# Patient Record
Sex: Male | Born: 1980 | Race: White | Hispanic: No | Marital: Married | Smoking: Former smoker
Health system: Southern US, Community
[De-identification: ages and names within clinical notes are randomized; demographics above are authoritative.]

## PROBLEM LIST (undated history)

## (undated) DIAGNOSIS — F419 Anxiety disorder, unspecified: Secondary | ICD-10-CM

## (undated) DIAGNOSIS — G4730 Sleep apnea, unspecified: Secondary | ICD-10-CM

## (undated) DIAGNOSIS — L4050 Arthropathic psoriasis, unspecified: Secondary | ICD-10-CM

## (undated) DIAGNOSIS — L409 Psoriasis, unspecified: Secondary | ICD-10-CM

## (undated) DIAGNOSIS — M1990 Unspecified osteoarthritis, unspecified site: Secondary | ICD-10-CM

## (undated) DIAGNOSIS — M109 Gout, unspecified: Secondary | ICD-10-CM

## (undated) DIAGNOSIS — I10 Essential (primary) hypertension: Secondary | ICD-10-CM

## (undated) HISTORY — DX: Psoriasis, unspecified: L40.9

## (undated) HISTORY — DX: Essential (primary) hypertension: I10

## (undated) HISTORY — DX: Gout, unspecified: M10.9

## (undated) HISTORY — DX: Sleep apnea, unspecified: G47.30

## (undated) HISTORY — DX: Unspecified osteoarthritis, unspecified site: M19.90

## (undated) HISTORY — PX: VASECTOMY: SHX75

## (undated) HISTORY — DX: Anxiety disorder, unspecified: F41.9

## (undated) HISTORY — DX: Arthropathic psoriasis, unspecified: L40.50

---

## 2012-04-14 HISTORY — PX: ANTERIOR CRUCIATE LIGAMENT REPAIR: SHX115

## 2015-10-10 ENCOUNTER — Other Ambulatory Visit (HOSPITAL_COMMUNITY): Payer: Self-pay | Admitting: Rheumatology

## 2015-10-10 ENCOUNTER — Ambulatory Visit (HOSPITAL_COMMUNITY)
Admission: RE | Admit: 2015-10-10 | Discharge: 2015-10-10 | Disposition: A | Discharge status: Home / Self Care | Payer: BLUE CROSS/BLUE SHIELD | Source: Ambulatory Visit | Attending: Rheumatology | Admitting: Rheumatology

## 2015-10-10 DIAGNOSIS — Z79899 Other long term (current) drug therapy: Secondary | ICD-10-CM | POA: Diagnosis present

## 2016-01-23 ENCOUNTER — Ambulatory Visit (INDEPENDENT_AMBULATORY_CARE_PROVIDER_SITE_OTHER): Payer: BLUE CROSS/BLUE SHIELD | Admitting: Rheumatology

## 2016-01-23 DIAGNOSIS — M533 Sacrococcygeal disorders, not elsewhere classified: Secondary | ICD-10-CM

## 2016-01-23 DIAGNOSIS — M79671 Pain in right foot: Secondary | ICD-10-CM

## 2016-01-23 DIAGNOSIS — L408 Other psoriasis: Secondary | ICD-10-CM

## 2016-01-23 DIAGNOSIS — M79641 Pain in right hand: Secondary | ICD-10-CM

## 2016-01-23 DIAGNOSIS — Z79899 Other long term (current) drug therapy: Secondary | ICD-10-CM

## 2016-01-23 DIAGNOSIS — M542 Cervicalgia: Secondary | ICD-10-CM

## 2016-02-05 ENCOUNTER — Telehealth: Payer: Self-pay | Admitting: Radiology

## 2016-02-05 NOTE — Telephone Encounter (Signed)
I have called patient to advise labs are c/w previous ALT 61/ they are available to view in [facility]

## 2016-02-18 ENCOUNTER — Other Ambulatory Visit: Payer: Self-pay | Admitting: Rheumatology

## 2016-02-18 DIAGNOSIS — Z79899 Other long term (current) drug therapy: Secondary | ICD-10-CM

## 2016-02-18 LAB — CBC WITH DIFFERENTIAL/PLATELET
BASOS PCT: 0 %
Basophils Absolute: 0 cells/uL (ref 0–200)
Eosinophils Absolute: 162 cells/uL (ref 15–500)
Eosinophils Relative: 2 %
HEMATOCRIT: 45.3 % (ref 38.5–50.0)
HEMOGLOBIN: 15.6 g/dL (ref 13.2–17.1)
LYMPHS ABS: 2835 {cells}/uL (ref 850–3900)
LYMPHS PCT: 35 %
MCH: 31.5 pg (ref 27.0–33.0)
MCHC: 34.4 g/dL (ref 32.0–36.0)
MCV: 91.3 fL (ref 80.0–100.0)
MONO ABS: 729 {cells}/uL (ref 200–950)
MPV: 9.4 fL (ref 7.5–12.5)
Monocytes Relative: 9 %
Neutro Abs: 4374 cells/uL (ref 1500–7800)
Neutrophils Relative %: 54 %
Platelets: 254 10*3/uL (ref 140–400)
RBC: 4.96 MIL/uL (ref 4.20–5.80)
RDW: 13.9 % (ref 11.0–15.0)
WBC: 8.1 10*3/uL (ref 3.8–10.8)

## 2016-02-19 LAB — COMPLETE METABOLIC PANEL WITH GFR
ALT: 66 U/L — AB (ref 9–46)
AST: 34 U/L (ref 10–40)
Albumin: 4.9 g/dL (ref 3.6–5.1)
Alkaline Phosphatase: 69 U/L (ref 40–115)
BUN: 10 mg/dL (ref 7–25)
CALCIUM: 9.6 mg/dL (ref 8.6–10.3)
CHLORIDE: 105 mmol/L (ref 98–110)
CO2: 24 mmol/L (ref 20–31)
CREATININE: 1.07 mg/dL (ref 0.60–1.35)
GFR, Est African American: >89 mL/min (ref >=60)
GFR, Est Non African American: >89 mL/min (ref >=60)
GLUCOSE: 98 mg/dL (ref 65–99)
Potassium: 5 mmol/L (ref 3.5–5.3)
Sodium: 138 mmol/L (ref 135–146)
Total Bilirubin: 0.9 mg/dL (ref 0.2–1.2)
Total Protein: 7.2 g/dL (ref 6.1–8.1)

## 2016-02-20 ENCOUNTER — Telehealth: Payer: Self-pay | Admitting: Radiology

## 2016-02-20 NOTE — Telephone Encounter (Signed)
C/W previous will call pt

## 2016-02-20 NOTE — Telephone Encounter (Signed)
Advised patient

## 2016-02-26 ENCOUNTER — Telehealth: Payer: Self-pay | Admitting: Rheumatology

## 2016-02-26 NOTE — Telephone Encounter (Signed)
We have already provided him several months of samples, I spoke to patient / he states you told him we could give him samples, since you have him on weekly Humira until he returns to clinic in Dec.  He has visit 03/24/16 for follow up,do you want him to use weekly until then ? Notes states weekly x1 month, but this will be 2 months. Please advise.

## 2016-02-26 NOTE — Telephone Encounter (Signed)
Ok to give Humira samples. Total of 2 months on weekly Humira.

## 2016-02-26 NOTE — Telephone Encounter (Signed)
Patient would like to know about getting samples of Humira to supplement the dosage he receives from  [facility]. Please call patient.

## 2016-02-27 NOTE — Telephone Encounter (Signed)
Called him to advise he can pick up samples

## 2016-03-14 ENCOUNTER — Other Ambulatory Visit: Payer: Self-pay | Admitting: Rheumatology

## 2016-03-14 NOTE — Telephone Encounter (Signed)
Last Visit: 01/23/16 Next Visit: 03/24/16 Labs: 01/24/16 WNL  Okay to refill Prednisone?

## 2016-03-17 ENCOUNTER — Telehealth: Payer: Self-pay | Admitting: Pharmacist

## 2016-03-17 NOTE — Telephone Encounter (Signed)
Patient states that after he takes the MTX he feels bad.He states that some days are good but he is also having bad days with his joints. He states he is having more bad days then good. He is having swelling in the feet and ankles. Aching in the knees as well as pain in the neck, shoulders and elbows. Hands are also swelling. He has a follow up appointment 03/24/16.

## 2016-03-17 NOTE — Telephone Encounter (Signed)
Please call patient to find out if he still needs prednisone. He was on taper and he finished the taper. He is on Humira and methotrexate. If the medications are working for him he should be able to stop his prednisone

## 2016-03-17 NOTE — Telephone Encounter (Signed)
Called patient.  Advised him that a prednisone taper was sent in for him.  Reviewed instructions of taper.    Also discussed that Dr. Corliss Skainseveshwar wants to apply for Enbrel.  Counseled patient that this will replace his Humira.  Counseled patient on the purpose, proper use, and adverse effects of Enbrel.  Will mail patient consent on Enbrel.  Patient is scheduled for appointment on 03/24/16.  Patient's most recent Humira dose was on 03/13/16.  Advised patient that he will need to wait until 2 weeks after his most recent Humira dose before starting Enbrel.  Applied for Enbrel through [employer] insurance.  Will update patient once we know status of the prior approval.     Lilla Shookachel Sondos Wolfman, Pharm.D., BCPS Clinical Pharmacist Pager: [phone] Phone: [phone] 03/17/2016 3:10 PM

## 2016-03-17 NOTE — Telephone Encounter (Signed)
Ok to give Prednisone taper as rxd earlier. He will need change in TT. Will apply for Enbrel and schedule an appointment.

## 2016-03-17 NOTE — Telephone Encounter (Addendum)
Received approval from patient's insurance for Enbrel (Case ID: [id number], Coverage Start Date: 03/17/16, Coverage End Date: 03/17/17).    Patient's most recent Humira injection was on 03/13/16.  He currently has appointment scheduled for 03/24/16 but will not be able to start Enbrel until two weeks after the Humira on 03/27/16.  Do you want patient to try to reschedule his appointment for 03/27/16?

## 2016-03-18 NOTE — Telephone Encounter (Signed)
Yes

## 2016-03-18 NOTE — Telephone Encounter (Signed)
Received return phone call from patient.  Advised that Enbrel was approved by his insurance company.  Discussed rescheduling appointment for 03/27/16 in order to give first Enbrel injection in the office.  Will need to get consent from patient prior to initiation injection or before sending in prescription for the medication.  Enbrel consent was mailed to patient on 03/17/16.  Will plan to obtain consent from patient during visit prior to initial injection and give initial injection using sample medication.    Lilla Shookachel Henderson, Pharm.D., BCPS Clinical Pharmacist Pager: [phone] Phone: [phone] 03/18/2016 1:38 PM

## 2016-03-18 NOTE — Telephone Encounter (Signed)
Called patient to advised that Enbrel was approved by his insurance and also discuss changing his appointment to 03/27/16.  There was no answer.  I left patient a message asking him to return my call.

## 2016-03-21 NOTE — Progress Notes (Signed)
Received a fax from [employer] [facility] regarding a prior authorization approval for Enbrel Pen from 03/17/2016 to 03/17/2017  Will scan document into epic.  Koichi Platte, [place], CPhT   11:00 AM a

## 2016-03-24 ENCOUNTER — Ambulatory Visit: Payer: BLUE CROSS/BLUE SHIELD | Admitting: Rheumatology

## 2016-03-25 DIAGNOSIS — L4050 Arthropathic psoriasis, unspecified: Secondary | ICD-10-CM | POA: Insufficient documentation

## 2016-03-25 DIAGNOSIS — Z79899 Other long term (current) drug therapy: Secondary | ICD-10-CM | POA: Insufficient documentation

## 2016-03-25 DIAGNOSIS — M533 Sacrococcygeal disorders, not elsewhere classified: Secondary | ICD-10-CM

## 2016-03-25 DIAGNOSIS — M79643 Pain in unspecified hand: Secondary | ICD-10-CM | POA: Insufficient documentation

## 2016-03-25 DIAGNOSIS — M542 Cervicalgia: Secondary | ICD-10-CM | POA: Insufficient documentation

## 2016-03-25 DIAGNOSIS — G8929 Other chronic pain: Secondary | ICD-10-CM | POA: Insufficient documentation

## 2016-03-25 NOTE — Progress Notes (Signed)
Office Visit Note  Patient: Miguel Evans             Date of Birth: June 20, 1980           MRN: [id number]             PCP: Enid Skeens., MD Referring: Enid Skeens., MD Visit Date: 03/27/2016 Occupation: Unemployed     Subjective:  Pain in hands and feet   History of Present Illness: Miguel Evans is a 35 y.o. male has history of psoriatic arthritis. He continues to have pain and discomfort in his hands and feet. He is been off Humira for 2 weeks now to start on Enbrel which he will be given today. He continues to have a lot of swelling in his hands and feet discomfort in his bilateral knee joints, and discomfort and rotating his head and neck. He denies any rash. He is on long-term disability currently has he is unable to do his work.  Activities of Daily Living:  Patient reports morning stiffness for 2 hours.   Patient Reports nocturnal pain.  Difficulty dressing/grooming: Denies Difficulty climbing stairs: Reports Difficulty getting out of chair: Reports Difficulty using hands for taps, buttons, cutlery, and/or writing: Reports   Review of Systems  Constitutional: Positive for fatigue. Negative for night sweats and weakness ( ).  HENT: Positive for mouth dryness. Negative for mouth sores and nose dryness.   Eyes: Negative for redness and dryness.  Respiratory: Negative for shortness of breath and difficulty breathing.   Cardiovascular: Negative for chest pain, palpitations, hypertension, irregular heartbeat and swelling in legs/feet.  Gastrointestinal: Positive for diarrhea. Negative for constipation.  Endocrine: Negative for increased urination.  Musculoskeletal: Positive for arthralgias, joint pain, joint swelling and morning stiffness. Negative for myalgias, muscle weakness, muscle tenderness and myalgias.  Skin: Negative for color change, rash, hair loss, nodules/bumps, skin tightness, ulcers and sensitivity to sunlight.  Allergic/Immunologic: Negative for susceptible to  infections.  Neurological: Negative for dizziness, fainting, memory loss and night sweats.  Hematological: Negative for swollen glands.  Psychiatric/Behavioral: Positive for sleep disturbance. Negative for depressed mood. The patient is nervous/anxious.     PMFS History:  Patient Active Problem List   Diagnosis Date Noted  . Hyperuricemia 03/26/2016  . Elevated blood pressure  03/26/2016  . Elevated LFTs 03/26/2016  . Anxiety 03/26/2016  . Psoriatic arthritis ([place]) 03/25/2016  . High risk medication use 03/25/2016  . Neck pain 03/25/2016  . Hand pain 03/25/2016  . 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 Anxiety   . Gout   . Hypertension     Family History  Problem Relation Age of Onset  . Cancer Mother   . Bipolar disorder Mother   . Hypertension Father   . Osteoarthritis Father   . Emphysema Maternal Grandfather   . CAD Paternal Grandfather    Past Surgical History:  Procedure Laterality Date  . ANTERIOR CRUCIATE LIGAMENT REPAIR Bilateral Left in 2002, Right 2013   Social History   Social History Narrative  . No narrative on file     Objective: Vital Signs: BP (!) 142/98   Pulse 100   Ht 6' 3"  (1.905 m)   Wt (!) 334 lb (151.5 kg)   BMI 41.75 kg/m    Physical Exam  Constitutional: He is oriented to person, place, and time. He appears well-developed and well-nourished.  HENT:  Head: Normocephalic and atraumatic.  Eyes: Conjunctivae and EOM are  normal. Pupils are equal, round, and reactive to light.  Neck: Normal range of motion. Neck supple.  Cardiovascular: Normal rate, regular rhythm and normal heart sounds.   Pulmonary/Chest: Effort normal and breath sounds normal.  Abdominal: Soft. Bowel sounds are normal.  Neurological: He is alert and oriented to person, place, and time.  Skin: Skin is warm and dry. Capillary refill takes less than 2 seconds.  Psychiatric: He has a normal mood and affect. His behavior is  normal.  Nursing note and vitals reviewed.    Musculoskeletal Exam: C-spine limited range of motion. Tenderness over left SI joint. Shoulder joints abduction limited to 90 bilaterally. Tenderness on palpation over bilateral elbow joints bilateral wrist joints bilateral PIPs and DIPs as described. Dactylitis and right thumb third finger, left second third and fourth finger. Painful range of motion of bilateral hip joints knee joints, ankle joints the right ankle joint is warm to touch he had dactylitis and his right second and third and fifth toe and left second and fourth toe  CDAI Exam: CDAI Homunculus Exam:   Tenderness:  RUE: glenohumeral and ulnohumeral and radiohumeral LUE: glenohumeral and ulnohumeral and radiohumeral Right hand: 1st PIP, 2nd PIP, 3rd PIP, 2nd DIP and 3rd DIP Left hand: 2nd PIP, 3rd PIP, 4th PIP, 2nd DIP, 3rd DIP and 4th DIP RLE: tibiofemoral and tibiotalar LLE: tibiofemoral Right foot: 2nd MTP, 3rd MTP, 5th MTP, 2nd PIP, 3rd PIP and 5th PIP Left foot: 2nd MTP, 4th MTP, 2nd PIP and 4th PIP  Swelling:  Right hand: 1st PIP, 2nd PIP, 3rd PIP, 2nd DIP and 3rd DIP Left hand: 2nd PIP, 3rd PIP, 4th PIP, 2nd DIP, 3rd DIP and 4th DIP RLE: tibiotalar Right foot: 2nd MTP, 3rd MTP, 5th MTP, 2nd PIP, 3rd PIP and 5th PIP Left foot: 2nd MTP, 4th MTP, 2nd PIP and 4th PIP  Joint Counts:  CDAI Tender Joint count: 12 CDAI Swollen Joint count: 6  Global Assessments:  Patient Global Assessment: 8 Provider Global Assessment: 8  CDAI Calculated Score: 34    Investigation: Findings:  June 2017:  CBC, comprehensive metabolic panel, sed rate, CK, TSH, UA were normal.  Uric acid was 8.9 which is still elevated.  Rheumatoid factor, CCP, and 14-3-3 eta were normal.  ANA was 1:280 nucleolar pattern.  Hepatitis panel, HIV, immunoglobulins, SPEP, and TB Gold were all within normal limits  Chest Xray 2 view 10/10/15 no active disease   Lab on 02/18/2016  Component Date Value Ref  Range Status  . WBC 02/18/2016 8.1  3.8 - 10.8 K/uL Final  . RBC 02/18/2016 4.96  4.20 - 5.80 MIL/uL Final  . Hemoglobin 02/18/2016 15.6  13.2 - 17.1 g/dL Final  . HCT 02/18/2016 45.3  38.5 - 50.0 % Final  . MCV 02/18/2016 91.3  80.0 - 100.0 fL Final  . MCH 02/18/2016 31.5  27.0 - 33.0 pg Final  . MCHC 02/18/2016 34.4  32.0 - 36.0 g/dL Final  . RDW 02/18/2016 13.9  11.0 - 15.0 % Final  . Platelets 02/18/2016 254  140 - 400 K/uL Final  . MPV 02/18/2016 9.4  7.5 - 12.5 fL Final  . Neutro Abs 02/18/2016 4374  1,500 - 7,800 cells/uL Final  . Lymphs Abs 02/18/2016 2835  850 - 3,900 cells/uL Final  . Monocytes Absolute 02/18/2016 729  200 - 950 cells/uL Final  . Eosinophils Absolute 02/18/2016 162  15 - 500 cells/uL Final  . Basophils Absolute 02/18/2016 0  0 - 200 cells/uL Final  .  Neutrophils Relative % 02/18/2016 54  % Final  . Lymphocytes Relative 02/18/2016 35  % Final  . Monocytes Relative 02/18/2016 9  % Final  . Eosinophils Relative 02/18/2016 2  % Final  . Basophils Relative 02/18/2016 0  % Final  . Smear Review 02/18/2016 Criteria for review not met   Final  . Sodium 02/19/2016 138  135 - 146 mmol/L Final  . Potassium 02/19/2016 5.0  3.5 - 5.3 mmol/L Final  . Chloride 02/19/2016 105  98 - 110 mmol/L Final  . CO2 02/19/2016 24  20 - 31 mmol/L Final  . Glucose, Bld 02/19/2016 98  65 - 99 mg/dL Final  . BUN 02/19/2016 10  7 - 25 mg/dL Final  . Creat 02/19/2016 1.07  0.60 - 1.35 mg/dL Final  . Total Bilirubin 02/19/2016 0.9  0.2 - 1.2 mg/dL Final  . Alkaline Phosphatase 02/19/2016 69  40 - 115 U/L Final  . AST 02/19/2016 34  10 - 40 U/L Final  . ALT 02/19/2016 66* 9 - 46 U/L Final  . Total Protein 02/19/2016 7.2  6.1 - 8.1 g/dL Final  . Albumin 02/19/2016 4.9  3.6 - 5.1 g/dL Final  . Calcium 02/19/2016 9.6  8.6 - 10.3 mg/dL Final  . GFR, Est African American 02/19/2016 >89  >=60 mL/min Final  . GFR, Est Non African American 02/19/2016 >89  >=60 mL/min Final      Imaging: No  results found.  Speciality Comments: No specialty comments available.    Procedures:  No procedures performed Allergies: Patient has no allergy information on record.   Assessment / Plan:     Visit Diagnoses: Psoriatic arthritis ([place]) - Dactylitis, Achillis tendinitis, sacroiliitis. He still has very active disease with the pain and inflammation in multiple joints. He did not respond to methotrexate him 0.8 ML subcutaneous and Humira combination. He came off Humira 2 weeks ago and came in to get his first Enbrel injection in the office today which was administered in the office today after indications side effects contraindications were discussed handout was given consent was taken he was observed in the office for 30 minutes without any side effects. He'll be taking Enbrel injections at home now every week. Due to ongoing pain and swelling he was given prednisone 5 mg tablets he will take 10 mg and taper by 5 mg every 2 weeks.   High risk medication use - Humira every other weekwhich has been discontinued. , methotrexate 0.8 ML  subcutaneous. His LFTs are elevated. We will reduce his methotrexate 2.6 ML subcutaneously.   Chronic left SI joint pain: He continues to have some discomfort in left SI joint.   Neck pain: He has decreased range of motion.   dactylitis: He has still on dactylitis and is some hands and feet. He is having difficulty with mobility .  Hyperuricemia: Not symptomatic   Elevated blood pressure : His blood pressure is a still elevated at advised him to monitor his blood pressure closely and follow up with his PCP.   Elevated LFTs - advised not to drink alcohol and we have reduced the methotrexate dose.   Anxiety : Due to his disease process.  He is unable to work at this time I'll give him a work excuse until her next follow-up visit in 3 months. Temporary parking placard was also given.    Orders: Orders Placed This Encounter  Procedures  . CBC with  Differential/Platelet  . COMPLETE METABOLIC PANEL WITH GFR   Meds  ordered this encounter  Medications  . etanercept (ENBREL SURECLICK) 50 MG/ML injection    Sig: Inject 0.98 mLs (50 mg total) into the skin once a week.    Dispense:  11.76 mL    Refill:  0    Face-to-face time spent with patient was 45 minutes. 50% of time was spent in counseling and coordination of care.  Follow-Up Instructions: Return in about 3 months (around 06/25/2016) for Psoriatic arthritis.   Bo Merino, MD

## 2016-03-26 DIAGNOSIS — F419 Anxiety disorder, unspecified: Secondary | ICD-10-CM | POA: Insufficient documentation

## 2016-03-26 DIAGNOSIS — R030 Elevated blood-pressure reading, without diagnosis of hypertension: Secondary | ICD-10-CM | POA: Insufficient documentation

## 2016-03-26 DIAGNOSIS — R7989 Other specified abnormal findings of blood chemistry: Secondary | ICD-10-CM | POA: Insufficient documentation

## 2016-03-26 DIAGNOSIS — R945 Abnormal results of liver function studies: Secondary | ICD-10-CM

## 2016-03-26 DIAGNOSIS — E790 Hyperuricemia without signs of inflammatory arthritis and tophaceous disease: Secondary | ICD-10-CM | POA: Insufficient documentation

## 2016-03-27 ENCOUNTER — Telehealth: Payer: Self-pay | Admitting: Rheumatology

## 2016-03-27 ENCOUNTER — Encounter: Payer: Self-pay | Admitting: Rheumatology

## 2016-03-27 ENCOUNTER — Ambulatory Visit (INDEPENDENT_AMBULATORY_CARE_PROVIDER_SITE_OTHER): Payer: BLUE CROSS/BLUE SHIELD | Admitting: Rheumatology

## 2016-03-27 VITALS — BP 142/98 | HR 100 | Ht 75.0 in | Wt 334.0 lb

## 2016-03-27 DIAGNOSIS — M79641 Pain in right hand: Secondary | ICD-10-CM

## 2016-03-27 DIAGNOSIS — L4050 Arthropathic psoriasis, unspecified: Secondary | ICD-10-CM

## 2016-03-27 DIAGNOSIS — G8929 Other chronic pain: Secondary | ICD-10-CM | POA: Diagnosis not present

## 2016-03-27 DIAGNOSIS — M533 Sacrococcygeal disorders, not elsewhere classified: Secondary | ICD-10-CM | POA: Diagnosis not present

## 2016-03-27 DIAGNOSIS — R7989 Other specified abnormal findings of blood chemistry: Secondary | ICD-10-CM

## 2016-03-27 DIAGNOSIS — E790 Hyperuricemia without signs of inflammatory arthritis and tophaceous disease: Secondary | ICD-10-CM | POA: Diagnosis not present

## 2016-03-27 DIAGNOSIS — R030 Elevated blood-pressure reading, without diagnosis of hypertension: Secondary | ICD-10-CM | POA: Diagnosis not present

## 2016-03-27 DIAGNOSIS — Z79899 Other long term (current) drug therapy: Secondary | ICD-10-CM

## 2016-03-27 DIAGNOSIS — F419 Anxiety disorder, unspecified: Secondary | ICD-10-CM

## 2016-03-27 DIAGNOSIS — R945 Abnormal results of liver function studies: Secondary | ICD-10-CM

## 2016-03-27 DIAGNOSIS — M542 Cervicalgia: Secondary | ICD-10-CM

## 2016-03-27 DIAGNOSIS — M79642 Pain in left hand: Secondary | ICD-10-CM | POA: Diagnosis not present

## 2016-03-27 MED ORDER — ETANERCEPT 50 MG/ML ~~LOC~~ SOAJ
50.0000 mg | SUBCUTANEOUS | 0 refills | Status: DC
Start: 2016-03-27 — End: 2016-04-02

## 2016-03-27 NOTE — Telephone Encounter (Signed)
Patient states he was looking through his paperwork when he got home and noticed he didn't;t have the letter to send to his long term disability company. Patient advised the letter was written and we apologize he didn't receive it before he left. Advised would have Dr. Corliss Skainseveshwar sign it. Patient provided fax number [phone] and claim number [id number] and asked if we could fax it for him. Patient advised letter would be faxed on his behalf.

## 2016-03-27 NOTE — Telephone Encounter (Signed)
Patient was seen this morning and states he was supposed to receive a note for his long term disability and he did not receive it. Can you check on this please and call patient?

## 2016-03-27 NOTE — Progress Notes (Signed)
Pharmacy Note Subjective: Patient presents today to the [facility] to see Dr. Corliss Skainseveshwar.  Patient is being switched from Humira to Enbrel.  He confirms his last dose of Humira was two weeks ago on 03/13/16.   Patient seen by the pharmacist for counseling on Enbrel.    Objective: TB Test: negative (10/10/15) Hepatitis panel: negative (10/10/15) WUJ:WJXBJYNWHIV:negative (10/10/15)  CBC    Component Value Date/Time   WBC 8.1 02/18/2016 1320   RBC 4.96 02/18/2016 1320   HGB 15.6 02/18/2016 1320   HCT 45.3 02/18/2016 1320   PLT 254 02/18/2016 1320   MCV 91.3 02/18/2016 1320   MCH 31.5 02/18/2016 1320   MCHC 34.4 02/18/2016 1320   RDW 13.9 02/18/2016 1320   LYMPHSABS 2,835 02/18/2016 1320   MONOABS 729 02/18/2016 1320   EOSABS 162 02/18/2016 1320   BASOSABS 0 02/18/2016 1320    Assessment/Plan:  Counseled patient that Enbrel is a TNF blocking agent.  Reviewed Enbrel dose of 50 mg once weekly.  Counseled patient on purpose, proper use, and adverse effects of Enbrel.  Reviewed the most common adverse effects including infections, headache, and injection site reactions. Discussed that there is the possibility of an increased risk of malignancy but it is not well understood if this increased risk is due to the medication or the disease state.  Advised patient to get yearly dermatology exams due to risk of skin cancer.  Reviewed the importance of regular labs while on Enbrel therapy.  Advised patient to get standing labs one month after starting Enbrel then every 2 months.  Provided patient with standing lab orders.  Counseled patient that Enbrel should be held prior to scheduled surgery.  Counseled patient to avoid live vaccines while on Enbrel.  Patient confirms he has already had his annual influenza vaccine and the pneumococcal vaccine.  Provided patient with medication education material and answered all questions.  Patient voiced understanding and consented to Enbrel.  Will upload consent  into the media tab.    Patient was educated on how to administer Enbrel using a SureClick demonstration pen.  Patient self-administered his initial injection in clinic today.  Lot # [id number], Exp 12/19.  Patient was monitored for 30 minutes post injection.  No injection site reaction was noted.  Provided patient with an Enbrel coupon card to assist with copay cost.    Lilla Shookachel Henderson, Pharm.D., BCPS Clinical Pharmacist Pager: [phone] Phone: [phone] 03/27/2016 8:25 AM

## 2016-03-27 NOTE — Patient Instructions (Addendum)
Prednisone Taper:  Take prednisone 10 mg (2 of the 5 mg tablets) daily for two weeks, then decrease dose to 5 mg (1 of the 5 mg tablets) daily for two weeks, then decrease dose to 2.5 mg (1/2 of a 5 mg tablet) daily for two weeks.   Reduce your methotrexate dose to 0.6 mL weekly.  Standing Labs We placed an order today for your standing lab work.    Please come back and get your standing labs in 1 month then every 2 months  We have open lab Monday through Friday from 8:30-11:30 AM and 1:30-4 PM at the office of Dr. Arbutus PedShaili Deveshwar/Naitik Panwala, PA.   The office is located at [address], [address], [place], [place] [address] No appointment is necessary.   Labs are drawn by [facility].  You may receive a bill from [facility] for your lab work.

## 2016-04-01 ENCOUNTER — Telehealth: Payer: Self-pay | Admitting: Rheumatology

## 2016-04-01 NOTE — Telephone Encounter (Signed)
Patient called and says the pharmacy told him colchicine was denied by his insurance company and [employer] insurance company is requesting a call from the office. Please call [employer].

## 2016-04-02 ENCOUNTER — Telehealth: Payer: Self-pay | Admitting: *Deleted

## 2016-04-02 MED ORDER — ETANERCEPT 50 MG/ML ~~LOC~~ SOAJ: 50.0000 mg | SUBCUTANEOUS | 0 refills | Status: DC

## 2016-04-02 NOTE — Telephone Encounter (Signed)
Patient advised okay to pick up samples of Enbrel. Patient to come in the morning and pick them up.

## 2016-04-02 NOTE — Telephone Encounter (Signed)
Patient states he is having trouble getting his Enbrel prescription filled. Patient states he has contacted the pharmacy and the insurance company and that his insurance uses [facility] for his speciality medications. Patient advised we would send prescription to [facility]. Patient states he will need samples until they mail the medication to him. Okay to given sample of Enbrel?

## 2016-04-02 NOTE — Telephone Encounter (Signed)
Patient called again about medications. Please call.

## 2016-04-02 NOTE — Telephone Encounter (Signed)
Brand name Colcrys is now preferred by patient's insurance company.  I called [facility] regarding colchicine.  They ran the prescription as Colcrys successfully.  I called patient to update him.  I also advised him that I can give him a Colcrys coupon card that will assist with the copay.  Patient will pick up coupon card tomorrow.     Lilla Shookachel Maricus Tanzi, Pharm.D., BCPS Clinical Pharmacist Pager: [phone] Phone: [phone] 04/02/2016 4:39 PM

## 2016-04-02 NOTE — Telephone Encounter (Signed)
Okay 

## 2016-04-03 NOTE — Telephone Encounter (Signed)
Medication Samples have been provided to the patient.  Drug name: Enbrel      Strength: 50 mg        Qty: 2  JYN:[id number]  Exp.Date: 03/2018  Dosing instructions: Inject one pen SQ weekly  The patient has been instructed regarding the correct time, dose, and frequency of taking this medication, including desired effects and most common side effects.   Miguel Evans 9:52 AM 04/03/2016

## 2016-04-09 ENCOUNTER — Telehealth: Payer: Self-pay

## 2016-04-09 NOTE — Telephone Encounter (Signed)
[facility] Mr.Cains Enbrel. The Technician said he called pts insurance and they told him that his Enbrel must be filled with [facility]. He was going to have the pharmacists transfer the RX to them so the patient could get it filled.  Ladaija Dimino, [place], CPhT

## 2016-04-09 NOTE — Telephone Encounter (Signed)
[facility] to verify the authorize dates for Mr. Miguel Evans Enbrel. Told them that it was auth. From 03/17/16-03/17/17. The medication was still being rejected by insurance. Technician was going to call insurance to see why it was rejecting.   Niel Peretti, [place], CPhT

## 2016-04-29 ENCOUNTER — Other Ambulatory Visit: Payer: Self-pay | Admitting: Rheumatology

## 2016-04-29 NOTE — Telephone Encounter (Signed)
Last Visit: 03/27/16 Next Visit: 06/27/16 Labs: 02/18/16 C/W previous labs  Okay to refill MTX?

## 2016-04-29 NOTE — Telephone Encounter (Signed)
Okay 

## 2016-05-21 ENCOUNTER — Other Ambulatory Visit: Payer: Self-pay | Admitting: Rheumatology

## 2016-05-21 NOTE — Telephone Encounter (Signed)
Last Visit: 03/27/16 Next Visit: 06/27/16 Labs: 02/18/16 C/W previous labs  Okay to refill Allopurinol?

## 2016-06-12 ENCOUNTER — Other Ambulatory Visit: Payer: Self-pay | Admitting: *Deleted

## 2016-06-12 MED ORDER — COLCHICINE 0.6 MG PO TABS: 0.6000 mg | ORAL_TABLET | Freq: Two times a day (BID) | ORAL | 3 refills | Status: DC

## 2016-06-12 NOTE — Telephone Encounter (Signed)
ok 

## 2016-06-12 NOTE — Telephone Encounter (Signed)
Refill Request received via fax  Last Visit: 03/27/16 Next Visit: 06/27/16 Labs: 02/18/16 C/W previous labs  Okay to refill Colcrys?

## 2016-06-25 ENCOUNTER — Other Ambulatory Visit: Payer: Self-pay | Admitting: Rheumatology

## 2016-06-25 NOTE — Telephone Encounter (Addendum)
Last Visit: 03/27/16 Next Visit: 06/27/16 Labs: 06/27/16 Elevated LFTS AST 76 previous normal ALT 118 previous 66 TB Gold: 09/2015 Neg   Okay to refill Enbrel?

## 2016-06-26 NOTE — Progress Notes (Signed)
Office Visit Note  Patient: Miguel Evans             Date of Birth: 1980-11-09           MRN: [id number]             PCP: Nonnie Done., MD Referring: Nonnie Done., MD Visit Date: 06/27/2016 Occupation: @GUAROCC @    Subjective:  Joint Pain and Follow-up   History of Present Illness: Miguel Evans is a 36 y.o. male   Last seen December 2017. See epic for full details. On that visit in December, he was started on Enbrel. He has been taking Enbrel every week since then except he had to discontinue Enbrel temporarily due to surgery for vasectomy. Then he restarted the medication a week later.  He also takes methotrexate 0.6 ML's every week. At one time he was on 0.8 ML's per week but it affected his liver function and so we decreased it to 0.6 and patient is doing well.  He is noncompliant with his labs and we've encouraged him to get his labs updated every 3 months starting today. Patient understands and is agreeable.  Currently he is having a fair amount of pain to various joints including neck, back, hips, knees, feet, hands and wrists. It is very important to note that prior to coming to our office, he was diagnosed with gout and treated for gout only. He remained untreated for psoriatic arthritis during this time. I suspect that he had fair amount of damage done to his joints while only being treated for gout.  Patient currently is on disability and is concerned what his long-term prognosis will be as it relates to psoriatic arthritis and psoriasis and his joints.  Patient states that he did well with exercise when he was feeling good but was unable to exercise with his joints began to be painful.  Activities of Daily Living:  Patient reports morning stiffness for 45 minutes.   Patient Reports nocturnal pain.  Difficulty dressing/grooming: Reports Difficulty climbing stairs: Reports Difficulty getting out of chair: Reports Difficulty using hands for taps, buttons, cutlery,  and/or writing: Reports   No Rheumatology ROS completed.   PMFS History:  Patient Active Problem List   Diagnosis Date Noted  . Hyperuricemia 03/26/2016  . Elevated blood pressure  03/26/2016  . Elevated LFTs 03/26/2016  . Anxiety 03/26/2016  . Psoriatic arthritis (HCC) 03/25/2016  . High risk medication use 03/25/2016  . Neck pain 03/25/2016  . Hand pain 03/25/2016  . 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 Anxiety   . Gout   . Hypertension     Family History  Problem Relation Age of Onset  . Cancer Mother   . Bipolar disorder Mother   . Hypertension Father   . Osteoarthritis Father   . Emphysema Maternal Grandfather   . CAD Paternal Grandfather    Past Surgical History:  Procedure Laterality Date  . ANTERIOR CRUCIATE LIGAMENT REPAIR Bilateral Left in 2002, Right 2013  . VASECTOMY     Social History   Social History Narrative  . No narrative on file     Objective: Vital Signs: BP (!) 140/98   Pulse 88   Resp 16   Ht 6\' 3"  (1.905 m)   Wt (!) 326 lb (147.9 kg)   BMI 40.75 kg/m    Physical Exam   Musculoskeletal Exam:  Decreased range of motion of head and neck secondary  to pain, bilateral shoulder joint, bilateral wrists, bilateral hands, bilateral hips. Grip strength is decreased bilaterally Fibromyalgia tender points are absent  CDAI Exam: No CDAI exam completed.  There is no synovitis on today's examination.  Investigation: Findings:  June 2017:  CBC, comprehensive metabolic panel, sed rate, CK, TSH, UA were normal.  Uric acid was 8.9 which is still elevated.  Rheumatoid factor, CCP, and 14-3-3 eta were normal.  ANA was 1:280 nucleolar pattern.  Hepatitis panel, HIV, immunoglobulins, SPEP, and TB Gold were all within normal limits  Chest Xray 2 view 10/10/15 no active disease  Imaging: No results found.  Speciality Comments: No specialty comments available.    Procedures:  No procedures  performed Allergies: Patient has no known allergies.   Assessment / Plan:     Visit Diagnoses: Psoriatic arthritis (HCC) - With Dactylitis  High risk medication use - Dec 2017: Enbrel q wkMTX 0.6 ML sub; MTX- 0.28mL subQ caused elevated LFT.[ **STOPPED Humira Dec 2017** ]  - Plan: CBC with Differential/Platelet, COMPLETE METABOLIC PANEL WITH GFR, Quantiferon tb gold assay (blood)  Bilateral chronic knee pain  Bilateral hip pain  Pain in both feet  Chronic left SI joint pain  Neck pain  Hyperuricemia  Elevated blood pressure  - 06/27/2016: Avoid NSAID's, steroid injections due to elevated blood pressure.  Elevated LFTs - while on MTX at 0.588ml  Anxiety   Plan:  #1: Psoriatic arthritis and rheumatoid arthritis. Patient is doing relatively better now than it was before. He does have ongoing pain. Namely the pain is to bilateral knees bilateral feet back. I suspect that some of that pain is coming from osteoarthritis. He also complains of neck pain.  Note that at at least for 5 years he was not treated for psoriatic arthritis and was instead treated for only gout. I wonder if he had damage done to his joints while he was treated for gout only and not giving any treatment for psoriatic arthritis.  Patient is responding better to the Enbrel overall then the Humira. At one time he was having about 2 hours of morning stiffness. On a good day now he is about 45 minutes of morning stiffness. This is only after about 2-1/2 months of being on Enbrel. Note dictated to discontinue Enbrel for about a week or 2 when he was getting his surgery for facetectomy.  Patient was also on  prednisone 20 mg for 4 days, 15 for 4, 10 for 4 days, 5 mg for 4 days, then stop. During this time I suspect that he had a fair amount of improvement and patient agreed with me on that. Unfortunately prednisone also not only upset his stomach but altered his mood and he would like to avoid using the medication  when possible. In addition his blood pressure is high today as well as in the past and the prednisone can indeed affect his blood pressure so we want to minimize use of prednisone for this patient. Currently he is addressing his pain was naproxen sodium. This too can affect his blood pressure and we've encouraged him to minimize use of the NSAID when possible including naproxen sodium. Patient is agreeable.  #2: I believe patient's osteoarthritis of the knees is causing pain to his feet and ankles as well as his hip and back. He may benefit from Visco supplementation to his knees. I fear giving him cortisone injection in his knees secondary to his elevated blood pressure. Patient is agreeable and wants us to apply for  Visco. I will ask for Hyalgan for this patient in hopes that it will give him fair amount of relief.  #3: Patient is noncompliant with labs we had a long and thorough discussion on getting the proper labs done at the proper time. He is agreeable to get them done on a regular basis. He did CBC with differential CMP with GFR today. He will get them done every 3 months.  Continue Enbrel every week and methotrexate 0.6 mL's every week and folic acid 2 mg daily. Note that his liver functions went up in October 2017/November 2017 when he was on the higher dose of methotrexate at 0.8 ML's per week. Patient is aware to remind [place] should we want to go up on the methotrexate that he had this issue so that we can find alternative treatment for him.  #4: He also complains that when he was having extended standing at times he would have fluid in his legs that went up above his knees. He states that when he lies down horizontal and elevates his legs, the fluid goes away. This is very consistent with having peripheral edema. I've asked the patient to discuss this with the PCP so that they may want to put him on some fluid pill if appropriate. I'll leave this to the discretion of his  PCP. Patient has made minimal May to wear to follow with his family doctor regarding this as well as his high blood pressure.  #5: Patient's examination today did not show any evidence of synovitis. He does have prominent ulnar styloid on the right wrist and little bit less on the left wrist. There was no evidence of psoriasis on today's examination. Overall I feel like the Enbrel probably is working well for him. I'll will determine in the next visit in 2-3 months whether Enbrel is sufficient for the patient's needs. I am concerned that he has to do with a great amount of pain probably from the on treated psoriatic arthritis that the patient suffered through for about 5 years while they were giving him only treatment for gout.  #6: Patient is on disability at this time. He is not working at all at this time. We will forward appropriate documentation from his previous office visits to whoever the patient designates. He is aware that we do not determine whether or not he is disabled. But his lawyer with the help of proper doctors will do a functional capacity evaluation and determine if his case is strong enough to present to a judge who will then determine whether or not he gets disability. Patient is aware of this.  #7: Return to clinic in 3 months  #8: Okay to renew marking placard let's do one year for now. I have hopes that patient will improve over the next few months and he may not need a parking placard.  #9: I will apply for Visco supplementation for the patient at this time. Hopefully we will hear something in the next 1 or 2 months. If possible I like to get him started in May or June 2018. If his insurance company only approves Euflexxa or Orthovisc, I'll be happy with that but I would prefer the patient to be on Hyalgan if possible.   Orders: No orders of the defined types were placed in this encounter.  No orders of the defined types were placed in this  encounter.   Face-to-face time spent with patient was 40 minutes. 50% of time was spent in counseling and coordination  of care.  Follow-Up Instructions: Return in about 3 months (around 09/27/2016) for PsA, Ps, Enbrel, MTX 0.61ml, Folic 2mg  qd, knee pian, back pain, feet pain (some from OA).   Tawni Pummel, PA-C  Note - This record has been created using [employer].  Chart creation errors have been sought, but may not always  have been located. Such creation errors do not reflect on  the standard of medical care.

## 2016-06-27 ENCOUNTER — Encounter: Payer: Self-pay | Admitting: Rheumatology

## 2016-06-27 ENCOUNTER — Telehealth: Payer: Self-pay | Admitting: Rheumatology

## 2016-06-27 ENCOUNTER — Ambulatory Visit (INDEPENDENT_AMBULATORY_CARE_PROVIDER_SITE_OTHER): Payer: BLUE CROSS/BLUE SHIELD | Admitting: Rheumatology

## 2016-06-27 VITALS — BP 140/98 | HR 88 | Resp 16 | Ht 75.0 in | Wt 326.0 lb

## 2016-06-27 DIAGNOSIS — L4050 Arthropathic psoriasis, unspecified: Secondary | ICD-10-CM

## 2016-06-27 DIAGNOSIS — M25562 Pain in left knee: Secondary | ICD-10-CM

## 2016-06-27 DIAGNOSIS — F419 Anxiety disorder, unspecified: Secondary | ICD-10-CM

## 2016-06-27 DIAGNOSIS — M542 Cervicalgia: Secondary | ICD-10-CM

## 2016-06-27 DIAGNOSIS — M79672 Pain in left foot: Secondary | ICD-10-CM

## 2016-06-27 DIAGNOSIS — G8929 Other chronic pain: Secondary | ICD-10-CM | POA: Diagnosis not present

## 2016-06-27 DIAGNOSIS — E790 Hyperuricemia without signs of inflammatory arthritis and tophaceous disease: Secondary | ICD-10-CM

## 2016-06-27 DIAGNOSIS — M25561 Pain in right knee: Secondary | ICD-10-CM

## 2016-06-27 DIAGNOSIS — M79671 Pain in right foot: Secondary | ICD-10-CM | POA: Diagnosis not present

## 2016-06-27 DIAGNOSIS — M533 Sacrococcygeal disorders, not elsewhere classified: Secondary | ICD-10-CM | POA: Diagnosis not present

## 2016-06-27 DIAGNOSIS — R945 Abnormal results of liver function studies: Secondary | ICD-10-CM

## 2016-06-27 DIAGNOSIS — M25551 Pain in right hip: Secondary | ICD-10-CM | POA: Diagnosis not present

## 2016-06-27 DIAGNOSIS — R030 Elevated blood-pressure reading, without diagnosis of hypertension: Secondary | ICD-10-CM | POA: Diagnosis not present

## 2016-06-27 DIAGNOSIS — R7989 Other specified abnormal findings of blood chemistry: Secondary | ICD-10-CM

## 2016-06-27 DIAGNOSIS — M25552 Pain in left hip: Secondary | ICD-10-CM

## 2016-06-27 DIAGNOSIS — Z79899 Other long term (current) drug therapy: Secondary | ICD-10-CM | POA: Diagnosis not present

## 2016-06-27 LAB — COMPLETE METABOLIC PANEL WITH GFR
ALBUMIN: 4.5 g/dL (ref 3.6–5.1)
ALK PHOS: 70 U/L (ref 40–115)
ALT: 118 U/L — ABNORMAL HIGH (ref 9–46)
AST: 76 U/L — AB (ref 10–40)
BILIRUBIN TOTAL: 0.8 mg/dL (ref 0.2–1.2)
BUN: 13 mg/dL (ref 7–25)
CO2: 24 mmol/L (ref 20–31)
Calcium: 9.4 mg/dL (ref 8.6–10.3)
Chloride: 104 mmol/L (ref 98–110)
Creat: 0.82 mg/dL (ref 0.60–1.35)
GFR, Est African American: >89 mL/min (ref >=60)
GFR, Est Non African American: >89 mL/min (ref >=60)
GLUCOSE: 83 mg/dL (ref 65–99)
Potassium: 4.4 mmol/L (ref 3.5–5.3)
SODIUM: 138 mmol/L (ref 135–146)
TOTAL PROTEIN: 7.1 g/dL (ref 6.1–8.1)

## 2016-06-27 LAB — CBC WITH DIFFERENTIAL/PLATELET
BASOS ABS: 0 {cells}/uL (ref 0–200)
Basophils Relative: 0 %
EOS PCT: 2 %
Eosinophils Absolute: 138 cells/uL (ref 15–500)
HCT: 44.6 % (ref 38.5–50.0)
HEMOGLOBIN: 15.3 g/dL (ref 13.2–17.1)
LYMPHS ABS: 2829 {cells}/uL (ref 850–3900)
Lymphocytes Relative: 41 %
MCH: 31.9 pg (ref 27.0–33.0)
MCHC: 34.3 g/dL (ref 32.0–36.0)
MCV: 92.9 fL (ref 80.0–100.0)
MONO ABS: 690 {cells}/uL (ref 200–950)
MPV: 9.7 fL (ref 7.5–12.5)
Monocytes Relative: 10 %
NEUTROS ABS: 3243 {cells}/uL (ref 1500–7800)
Neutrophils Relative %: 47 %
Platelets: 205 10*3/uL (ref 140–400)
RBC: 4.8 MIL/uL (ref 4.20–5.80)
RDW: 14.1 % (ref 11.0–15.0)
WBC: 6.9 10*3/uL (ref 3.8–10.8)

## 2016-06-27 NOTE — Progress Notes (Signed)
Rheumatology Medication Review by a Pharmacist Does the patient feel that his/her medications are working for him/her?  Patient reports "night and day" difference when starting Enbrel.  He now reports the injection wears off over time.   Has the patient been experiencing any side effects to the medications prescribed?  No Does the patient have any problems obtaining medications?  No  Issues to address at subsequent visits: None   Pharmacist comments:  Miguel Evans is a pleasant 36 yo M who presents for follow up of his psoriatic arthritis.  He reports he is taking Enbrel 50 mg weekly, methotrexate 0.6 mL weekly, and folic acid 2 mg daily.  He confirms he has finished his prednisone taper in mid-January.  He reports he is taking naproxen at least once daily.  I reviewed with patient that naproxen and NSAIDs should be avoided with methotrexate due to increased risk of side effects.  Patient voiced understanding.  Patient has not had labs since starting Enbrel.  He is due for standing labs today.  Patient's TB Gold was negative on 10/10/15.  He will be due for TB Gold again in June 2018.  Patient denies any questions or concerns regarding his medications at this time.    Lilla Shookachel Ercell Razon, Pharm.D., BCPS, CPP Clinical Pharmacist Pager: [phone] Phone: [phone] 06/27/2016 2:16 PM

## 2016-06-27 NOTE — Telephone Encounter (Signed)
Patient is requesting office note from 06/27/16 be sent to his disability company.   Fax# [phone] Claim # [id number]

## 2016-06-29 NOTE — Progress Notes (Signed)
Elevated LFTS

## 2016-07-09 NOTE — Telephone Encounter (Signed)
I spoke with patient. He said he signed a release form but one it did make its way to me. I emailed him form and he will email it back. I will fax his records once I receive form back

## 2016-07-25 ENCOUNTER — Other Ambulatory Visit: Payer: Self-pay | Admitting: *Deleted

## 2016-07-25 DIAGNOSIS — Z79899 Other long term (current) drug therapy: Secondary | ICD-10-CM

## 2016-07-25 LAB — COMPLETE METABOLIC PANEL WITH GFR
ALT: 139 U/L — ABNORMAL HIGH (ref 9–46)
AST: 88 U/L — AB (ref 10–40)
Albumin: 4.8 g/dL (ref 3.6–5.1)
Alkaline Phosphatase: 75 U/L (ref 40–115)
BUN: 11 mg/dL (ref 7–25)
CALCIUM: 10 mg/dL (ref 8.6–10.3)
CHLORIDE: 102 mmol/L (ref 98–110)
CO2: 23 mmol/L (ref 20–31)
Creat: 1.06 mg/dL (ref 0.60–1.35)
GFR, Est Non African American: >89 mL/min (ref >=60)
Glucose, Bld: 84 mg/dL (ref 65–99)
POTASSIUM: 4.5 mmol/L (ref 3.5–5.3)
Sodium: 138 mmol/L (ref 135–146)
Total Bilirubin: 1.4 mg/dL — ABNORMAL HIGH (ref 0.2–1.2)
Total Protein: 7.8 g/dL (ref 6.1–8.1)

## 2016-07-25 LAB — CBC WITH DIFFERENTIAL/PLATELET
Basophils Absolute: 0 cells/uL (ref 0–200)
Basophils Relative: 0 %
Eosinophils Absolute: 249 cells/uL (ref 15–500)
Eosinophils Relative: 3 %
HEMATOCRIT: 47 % (ref 38.5–50.0)
Hemoglobin: 16.2 g/dL (ref 13.2–17.1)
LYMPHS PCT: 44 %
Lymphs Abs: 3652 cells/uL (ref 850–3900)
MCH: 32.1 pg (ref 27.0–33.0)
MCHC: 34.5 g/dL (ref 32.0–36.0)
MCV: 93.1 fL (ref 80.0–100.0)
MONOS PCT: 12 %
MPV: 9.5 fL (ref 7.5–12.5)
Monocytes Absolute: 996 cells/uL — ABNORMAL HIGH (ref 200–950)
NEUTROS PCT: 41 %
Neutro Abs: 3403 cells/uL (ref 1500–7800)
PLATELETS: 250 10*3/uL (ref 140–400)
RBC: 5.05 MIL/uL (ref 4.20–5.80)
RDW: 14.4 % (ref 11.0–15.0)
WBC: 8.3 10*3/uL (ref 3.8–10.8)

## 2016-07-26 NOTE — Progress Notes (Signed)
He should dc MTX , NSAIDS and if he is drinking any alcohol. LFTs are increasing.Please, get repeat LFts in 3weeks.

## 2016-08-15 ENCOUNTER — Other Ambulatory Visit: Payer: Self-pay

## 2016-08-15 DIAGNOSIS — Z79899 Other long term (current) drug therapy: Secondary | ICD-10-CM

## 2016-08-15 LAB — CBC WITH DIFFERENTIAL/PLATELET
BASOS ABS: 0 {cells}/uL (ref 0–200)
Basophils Relative: 0 %
EOS ABS: 168 {cells}/uL (ref 15–500)
EOS PCT: 2 %
HEMATOCRIT: 47.2 % (ref 38.5–50.0)
HEMOGLOBIN: 16 g/dL (ref 13.2–17.1)
LYMPHS ABS: 3276 {cells}/uL (ref 850–3900)
LYMPHS PCT: 39 %
MCH: 31.5 pg (ref 27.0–33.0)
MCHC: 33.9 g/dL (ref 32.0–36.0)
MCV: 92.9 fL (ref 80.0–100.0)
MPV: 9.4 fL (ref 7.5–12.5)
Monocytes Absolute: 840 cells/uL (ref 200–950)
Monocytes Relative: 10 %
NEUTROS ABS: 4116 {cells}/uL (ref 1500–7800)
Neutrophils Relative %: 49 %
Platelets: 237 10*3/uL (ref 140–400)
RBC: 5.08 MIL/uL (ref 4.20–5.80)
RDW: 14.2 % (ref 11.0–15.0)
WBC: 8.4 10*3/uL (ref 3.8–10.8)

## 2016-08-16 LAB — COMPLETE METABOLIC PANEL WITH GFR
ALBUMIN: 4.8 g/dL (ref 3.6–5.1)
ALK PHOS: 71 U/L (ref 40–115)
ALT: 106 U/L — ABNORMAL HIGH (ref 9–46)
AST: 71 U/L — AB (ref 10–40)
BUN: 14 mg/dL (ref 7–25)
CALCIUM: 10 mg/dL (ref 8.6–10.3)
CHLORIDE: 102 mmol/L (ref 98–110)
CO2: 25 mmol/L (ref 20–31)
Creat: 1.05 mg/dL (ref 0.60–1.35)
GFR, Est African American: >89 mL/min (ref >=60)
Glucose, Bld: 77 mg/dL (ref 65–99)
POTASSIUM: 4.6 mmol/L (ref 3.5–5.3)
Sodium: 140 mmol/L (ref 135–146)
Total Bilirubin: 1 mg/dL (ref 0.2–1.2)
Total Protein: 7.4 g/dL (ref 6.1–8.1)

## 2016-08-16 NOTE — Progress Notes (Signed)
Discontinue MTX. Repeat LFTs in 3 weeks

## 2016-08-18 ENCOUNTER — Telehealth: Payer: Self-pay | Admitting: Radiology

## 2016-08-18 LAB — QUANTIFERON TB GOLD ASSAY (BLOOD)
Interferon Gamma Release Assay: NEGATIVE
MITOGEN-NIL SO: 9.63 [IU]/mL
Quantiferon Nil Value: 0.07 IU/mL

## 2016-08-18 NOTE — Telephone Encounter (Signed)
Patient should discontinue Enbrel for 2 weeks and recheck LFTs. If it is still elevated I may consider GI referral

## 2016-08-18 NOTE — Telephone Encounter (Signed)
He states he has been off the MTX since you d/c several weeks ago, he states he has not had any ETOH, he is not on any Cholesterol meds or  Tylenol. He has appt in June, we can repeat the labs then  He is also on Allopurinol Enbrel and Colchicine, do you think any of these may be the cause?

## 2016-08-18 NOTE — Telephone Encounter (Signed)
-----   Message from Pollyann SavoyShaili Deveshwar, MD sent at 08/16/2016  9:12 PM EDT ----- Discontinue MTX. Repeat LFTs in 3 weeks

## 2016-08-19 NOTE — Telephone Encounter (Signed)
Patient advised to discontinue Enbrel for 2 week and then have his LFTs recheck. Patient advised if they are still elevated with next labs. Dr. Corliss Skainseveshwar may consider referral to GI. Patient verbalized understanding.

## 2016-09-03 ENCOUNTER — Telehealth (INDEPENDENT_AMBULATORY_CARE_PROVIDER_SITE_OTHER): Payer: Self-pay

## 2016-09-03 NOTE — Telephone Encounter (Signed)
Faxed Hyalgan application to [phone] 

## 2016-09-03 NOTE — Telephone Encounter (Signed)
-----   Message from [facility] Medical GroupWendy May, RT sent at 08/29/2016  4:11 PM EDT ----- Regarding: apply hyalgan See notes, try Hyalgan first, thanks.  Please fax in.   ----- Message ----- From: Audrie Liaaudle, Sharon H, RT Sent: 08/14/2016   4:43 PM To: Toniann FailWendy May, RT    ----- Message ----- From: Tawni PummelNaitik Panwala, PA-C Sent: 06/27/2016   3:02 PM To: Audrie LiaSharon H Caudle, RT  Apply hyalagan bilateral knees x5.  euflexxa or orthovisc will be ok if preferred by insurance company

## 2016-09-12 ENCOUNTER — Other Ambulatory Visit: Payer: Self-pay

## 2016-09-12 DIAGNOSIS — Z79899 Other long term (current) drug therapy: Secondary | ICD-10-CM

## 2016-09-12 LAB — CBC WITH DIFFERENTIAL/PLATELET
BASOS PCT: 0 %
Basophils Absolute: 0 cells/uL (ref 0–200)
EOS ABS: 158 {cells}/uL (ref 15–500)
Eosinophils Relative: 2 %
HEMATOCRIT: 45.9 % (ref 38.5–50.0)
Hemoglobin: 15.2 g/dL (ref 13.2–17.1)
LYMPHS ABS: 3318 {cells}/uL (ref 850–3900)
Lymphocytes Relative: 42 %
MCH: 30.6 pg (ref 27.0–33.0)
MCHC: 33.1 g/dL (ref 32.0–36.0)
MCV: 92.4 fL (ref 80.0–100.0)
MONO ABS: 553 {cells}/uL (ref 200–950)
MONOS PCT: 7 %
MPV: 8.9 fL (ref 7.5–12.5)
NEUTROS ABS: 3871 {cells}/uL (ref 1500–7800)
Neutrophils Relative %: 49 %
PLATELETS: 208 10*3/uL (ref 140–400)
RBC: 4.97 MIL/uL (ref 4.20–5.80)
RDW: 13.9 % (ref 11.0–15.0)
WBC: 7.9 10*3/uL (ref 3.8–10.8)

## 2016-09-12 LAB — COMPLETE METABOLIC PANEL WITH GFR
ALT: 88 U/L — AB (ref 9–46)
AST: 50 U/L — ABNORMAL HIGH (ref 10–40)
Albumin: 4.6 g/dL (ref 3.6–5.1)
Alkaline Phosphatase: 72 U/L (ref 40–115)
BILIRUBIN TOTAL: 0.8 mg/dL (ref 0.2–1.2)
BUN: 21 mg/dL (ref 7–25)
CO2: 24 mmol/L (ref 20–31)
CREATININE: 1.38 mg/dL — AB (ref 0.60–1.35)
Calcium: 9.5 mg/dL (ref 8.6–10.3)
Chloride: 103 mmol/L (ref 98–110)
GFR, EST AFRICAN AMERICAN: 77 mL/min (ref >=60)
GFR, Est Non African American: 66 mL/min (ref >=60)
Glucose, Bld: 103 mg/dL — ABNORMAL HIGH (ref 65–99)
Potassium: 4.5 mmol/L (ref 3.5–5.3)
Sodium: 138 mmol/L (ref 135–146)
TOTAL PROTEIN: 7 g/dL (ref 6.1–8.1)

## 2016-09-15 NOTE — Progress Notes (Signed)
I called patient and advised him to resume Enbrel. He states that he is not taking any over-the-counter medications and has not been drinking any alcohol. Please refer him to GI Dr. Loreta AveMann for evaluation of elevated LFTs.

## 2016-09-16 ENCOUNTER — Telehealth: Payer: Self-pay | Admitting: *Deleted

## 2016-09-16 DIAGNOSIS — R748 Abnormal levels of other serum enzymes: Secondary | ICD-10-CM

## 2016-09-16 NOTE — Telephone Encounter (Signed)
-----   Message from Miguel SavoyShaili Deveshwar, MD sent at 09/15/2016  3:41 PM EDT ----- I called patient and advised him to resume Enbrel. He states that he is not taking any over-the-counter medications and has not been drinking any alcohol. Please refer him to GI Dr. Loreta AveMann for evaluation of elevated LFTs.

## 2016-09-24 ENCOUNTER — Ambulatory Visit (INDEPENDENT_AMBULATORY_CARE_PROVIDER_SITE_OTHER): Payer: BLUE CROSS/BLUE SHIELD | Admitting: Rheumatology

## 2016-09-24 ENCOUNTER — Encounter: Payer: Self-pay | Admitting: Rheumatology

## 2016-09-24 ENCOUNTER — Other Ambulatory Visit: Payer: Self-pay | Admitting: Gastroenterology

## 2016-09-24 ENCOUNTER — Telehealth: Payer: Self-pay

## 2016-09-24 VITALS — BP 144/90 | HR 92 | Resp 18 | Wt 322.0 lb

## 2016-09-24 DIAGNOSIS — L4050 Arthropathic psoriasis, unspecified: Secondary | ICD-10-CM

## 2016-09-24 DIAGNOSIS — Z79899 Other long term (current) drug therapy: Secondary | ICD-10-CM

## 2016-09-24 DIAGNOSIS — L408 Other psoriasis: Secondary | ICD-10-CM | POA: Diagnosis not present

## 2016-09-24 DIAGNOSIS — R7989 Other specified abnormal findings of blood chemistry: Secondary | ICD-10-CM

## 2016-09-24 DIAGNOSIS — R945 Abnormal results of liver function studies: Principal | ICD-10-CM

## 2016-09-24 NOTE — Progress Notes (Signed)
Office Visit Note  Patient: Miguel Evans             Date of Birth: [birth year]           MRN: [id number]             PCP: Miguel Evans., MD Referring: Miguel Evans., MD Visit Date: 09/24/2016 Occupation: @GUAROCC @    Subjective:  Medication Management (has used Enbrel in past 2 weeks a little better) and Joint Pain (has had increased pain since d/c MTX )   History of Present Illness: Miguel Evans is a 36 y.o. male  Last seen in our office on 06/27/2016.  Patient reported that he was doing better on that visit than prior visit. He had still ongoing pain to bilateral knees, bilateral feet, and back. Differential diagnosis included osteoarthritis as a source of that pain.  He is taking Enbrel every week, methotrexate 0.6 ML's every week and folic acid 2 mg daily. (His LFTs were elevated when he was on 0.8 ML's of methotrexate). Also, he was having peripheral edema symptoms as last visit.  On the last visit, I asked him to come back in 3 months (today) to follow-up on the following:==>  Patient's examination today did not show any evidence of synovitis. He does have prominent ulnar styloid on the right wrist and little bit less on the left wrist. There was no evidence of psoriasis on today's examination. Overall I feel like the Enbrel probably is working well for him. I'll will determine in the next visit in 2-3 months whether Enbrel is sufficient for the patient's needs. I am concerned that he has to do with a great amount of pain probably from the on treated psoriatic arthritis that the patient suffered through for about 5 years while they were giving him only treatment for gout.  Today,  Patient states that due to his abnormal labs, he was asked to stop his methotrexate because even the lower dose of methotrexate was not helping his liver. At 0.8 his LFTs went up. So we decreased him to 0.6 and even that affected his liver and therefore it was best for him to discontinue  methotrexate  In addition, patient was also off of Enbrel for about 3 weeks since his last visit in March 2018.   Activities of Daily Living:  Patient reports morning stiffness for 90 minutes.   Patient Reports nocturnal pain.  Difficulty dressing/grooming: Reports Difficulty climbing stairs: Reports Difficulty getting out of chair: Reports Difficulty using hands for taps, buttons, cutlery, and/or writing: Reports   Review of Systems  Constitutional: Negative for fatigue.  HENT: Negative for mouth sores and mouth dryness.   Eyes: Negative for dryness.  Respiratory: Negative for shortness of breath.   Gastrointestinal: Negative for constipation and diarrhea.  Musculoskeletal: Negative for myalgias and myalgias.  Skin: Negative for sensitivity to sunlight.  Neurological: Negative for memory loss.  Psychiatric/Behavioral: Negative for sleep disturbance.    PMFS History:  Patient Active Problem List   Diagnosis Date Noted  . Hyperuricemia 03/26/2016  . Elevated blood pressure  03/26/2016  . Elevated LFTs 03/26/2016  . Anxiety 03/26/2016  . Psoriatic arthritis ([place]) 03/25/2016  . High risk medication use 03/25/2016  . Neck pain 03/25/2016  . Hand pain 03/25/2016  . 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 Anxiety   . Gout   . Hypertension     Family History  Problem Relation Age  of Onset  . Cancer Mother   . Bipolar disorder Mother   . Hypertension Father   . Osteoarthritis Father   . Emphysema Maternal Grandfather   . CAD Paternal Grandfather    Past Surgical History:  Procedure Laterality Date  . ANTERIOR CRUCIATE LIGAMENT REPAIR Bilateral Left in 2002, Right 2013  . VASECTOMY     Social History   Social History Narrative  . No narrative on file     Objective: Vital Signs: BP (!) 144/90   Pulse 92   Resp 18   Wt (!) 322 lb (146.1 kg)   BMI 40.25 kg/m    Physical Exam  Constitutional: He is oriented to  person, place, and time. He appears well-developed and well-nourished.  HENT:  Head: Normocephalic and atraumatic.  Eyes: Conjunctivae and EOM are normal. Pupils are equal, round, and reactive to light.  Neck: Normal range of motion. Neck supple.  Cardiovascular: Normal rate, regular rhythm and normal heart sounds.  Exam reveals no gallop and no friction rub.   No murmur heard. Pulmonary/Chest: Effort normal and breath sounds normal. No respiratory distress. He has no wheezes. He has no rales. He exhibits no tenderness.  Abdominal: Soft. He exhibits no distension and no mass. There is no tenderness. There is no guarding.  Musculoskeletal: Normal range of motion.  Lymphadenopathy:    He has no cervical adenopathy.  Neurological: He is alert and oriented to person, place, and time. He exhibits normal muscle tone. Coordination normal.  Skin: Skin is warm and dry. Capillary refill takes less than 2 seconds. No rash noted.  Psychiatric: He has a normal mood and affect. His behavior is normal. Judgment and thought content normal.  Vitals reviewed.    Musculoskeletal Exam:    CDAI Exam: CDAI Homunculus Exam:   Tenderness:  Right hand: 1st MCP, 2nd MCP, 3rd MCP, 4th MCP, 5th MCP, 1st PIP, 2nd PIP, 3rd PIP, 4th PIP and 5th PIP Left hand: 1st MCP, 2nd MCP, 3rd MCP, 4th MCP, 5th MCP, 1st PIP, 2nd PIP, 3rd PIP, 4th PIP and 5th PIP  Swelling:  Right hand: 1st MCP, 2nd MCP, 3rd MCP, 4th MCP, 5th MCP, 2nd PIP, 3rd PIP, 4th PIP and 5th PIP Left hand: 1st MCP, 2nd MCP, 3rd MCP, 4th MCP, 5th MCP, 1st PIP, 2nd PIP, 3rd PIP, 4th PIP and 5th PIP RLE: tibiotalar LLE: tibiotalar  Joint Counts:  CDAI Tender Joint count: 20 CDAI Swollen Joint count: 19     Investigation: No additional findings.  Orders Only on 09/12/2016  Component Date Value Ref Range Status  . WBC 09/12/2016 7.9  3.8 - 10.8 K/uL Final  . RBC 09/12/2016 4.97  4.20 - 5.80 MIL/uL Final  . Hemoglobin 09/12/2016 15.2  13.2 - 17.1  g/dL Final  . HCT 09/12/2016 45.9  38.5 - 50.0 % Final  . MCV 09/12/2016 92.4  80.0 - 100.0 fL Final  . MCH 09/12/2016 30.6  27.0 - 33.0 pg Final  . MCHC 09/12/2016 33.1  32.0 - 36.0 g/dL Final  . RDW 09/12/2016 13.9  11.0 - 15.0 % Final  . Platelets 09/12/2016 208  140 - 400 K/uL Final  . MPV 09/12/2016 8.9  7.5 - 12.5 fL Final  . Neutro Abs 09/12/2016 3871  1,500 - 7,800 cells/uL Final  . Lymphs Abs 09/12/2016 3318  850 - 3,900 cells/uL Final  . Monocytes Absolute 09/12/2016 553  200 - 950 cells/uL Final  . Eosinophils Absolute 09/12/2016 158  15 - 500  cells/uL Final  . Basophils Absolute 09/12/2016 0  0 - 200 cells/uL Final  . Neutrophils Relative % 09/12/2016 49  % Final  . Lymphocytes Relative 09/12/2016 42  % Final  . Monocytes Relative 09/12/2016 7  % Final  . Eosinophils Relative 09/12/2016 2  % Final  . Basophils Relative 09/12/2016 0  % Final  . Smear Review 09/12/2016 Criteria for review not met   Final  . Sodium 09/12/2016 138  135 - 146 mmol/L Final  . Potassium 09/12/2016 4.5  3.5 - 5.3 mmol/L Final  . Chloride 09/12/2016 103  98 - 110 mmol/L Final  . CO2 09/12/2016 24  20 - 31 mmol/L Final  . Glucose, Bld 09/12/2016 103* 65 - 99 mg/dL Final  . BUN 09/12/2016 21  7 - 25 mg/dL Final  . Creat 09/12/2016 1.38* 0.60 - 1.35 mg/dL Final  . Total Bilirubin 09/12/2016 0.8  0.2 - 1.2 mg/dL Final  . Alkaline Phosphatase 09/12/2016 72  40 - 115 U/L Final  . AST 09/12/2016 50* 10 - 40 U/L Final  . ALT 09/12/2016 88* 9 - 46 U/L Final  . Total Protein 09/12/2016 7.0  6.1 - 8.1 g/dL Final  . Albumin 09/12/2016 4.6  3.6 - 5.1 g/dL Final  . Calcium 09/12/2016 9.5  8.6 - 10.3 mg/dL Final  . GFR, Est African American 09/12/2016 77  >=60 mL/min Final  . GFR, Est Non African American 09/12/2016 66  >=60 mL/min Final  Orders Only on 08/15/2016  Component Date Value Ref Range Status  . WBC 08/15/2016 8.4  3.8 - 10.8 K/uL Final  . RBC 08/15/2016 5.08  4.20 - 5.80 MIL/uL Final  .  Hemoglobin 08/15/2016 16.0  13.2 - 17.1 g/dL Final  . HCT 08/15/2016 47.2  38.5 - 50.0 % Final  . MCV 08/15/2016 92.9  80.0 - 100.0 fL Final  . MCH 08/15/2016 31.5  27.0 - 33.0 pg Final  . MCHC 08/15/2016 33.9  32.0 - 36.0 g/dL Final  . RDW 08/15/2016 14.2  11.0 - 15.0 % Final  . Platelets 08/15/2016 237  140 - 400 K/uL Final  . MPV 08/15/2016 9.4  7.5 - 12.5 fL Final  . Neutro Abs 08/15/2016 4116  1,500 - 7,800 cells/uL Final  . Lymphs Abs 08/15/2016 3276  850 - 3,900 cells/uL Final  . Monocytes Absolute 08/15/2016 840  200 - 950 cells/uL Final  . Eosinophils Absolute 08/15/2016 168  15 - 500 cells/uL Final  . Basophils Absolute 08/15/2016 0  0 - 200 cells/uL Final  . Neutrophils Relative % 08/15/2016 49  % Final  . Lymphocytes Relative 08/15/2016 39  % Final  . Monocytes Relative 08/15/2016 10  % Final  . Eosinophils Relative 08/15/2016 2  % Final  . Basophils Relative 08/15/2016 0  % Final  . Smear Review 08/15/2016 Criteria for review not met   Final  . Sodium 08/15/2016 140  135 - 146 mmol/L Final  . Potassium 08/15/2016 4.6  3.5 - 5.3 mmol/L Final  . Chloride 08/15/2016 102  98 - 110 mmol/L Final  . CO2 08/15/2016 25  20 - 31 mmol/L Final  . Glucose, Bld 08/15/2016 77  65 - 99 mg/dL Final  . BUN 08/15/2016 14  7 - 25 mg/dL Final  . Creat 08/15/2016 1.05  0.60 - 1.35 mg/dL Final  . Total Bilirubin 08/15/2016 1.0  0.2 - 1.2 mg/dL Final  . Alkaline Phosphatase 08/15/2016 71  40 - 115 U/L Final  . AST 08/15/2016  71* 10 - 40 U/L Final  . ALT 08/15/2016 106* 9 - 46 U/L Final  . Total Protein 08/15/2016 7.4  6.1 - 8.1 g/dL Final  . Albumin 08/15/2016 4.8  3.6 - 5.1 g/dL Final  . Calcium 08/15/2016 10.0  8.6 - 10.3 mg/dL Final  . GFR, Est African American 08/15/2016 >89  >=60 mL/min Final  . GFR, Est Non African American 08/15/2016 >89  >=60 mL/min Final  . Interferon Gamma Release Assay 08/15/2016 NEGATIVE  NEGATIVE Final   Negative test result. M. tuberculosis complex infection  unlikely.  . Quantiferon Nil Value 08/15/2016 0.07  IU/mL Final  . Mitogen-Nil 08/15/2016 9.63  IU/mL Final  . Quantiferon Tb Ag Minus Nil Value 08/15/2016 <0.00  IU/mL Final   Comment:   The Nil tube value is used to determine if the patient has a preexisting immune response which could cause a false-positive reading on the test. In order for a test to be valid, the Nil tube must have a value of less than or equal to 8.0 IU/mL.   The mitogen control tube is used to assure the patient has a healthy immune status and also serves as a control for correct blood handling and incubation. It is used to detect false-negative readings. The mitogen tube must have a gamma interferon value of greater than or equal to 0.5 IU/mL higher than the value of the Nil tube.   The TB antigen tube is coated with the M. tuberculosis specific antigens. For a test to be considered positive, the TB antigen tube value minus the Nil tube value must be greater than or equal to 0.35 IU/mL.   For additional information, please refer to http://education.[facility].com/faq/QFT (This link is being provided for informational/educational purposes only.)   Orders Only on 07/25/2016  Component Date Value Ref Range Status  . WBC 07/25/2016 8.3  3.8 - 10.8 K/uL Final  . RBC 07/25/2016 5.05  4.20 - 5.80 MIL/uL Final  . Hemoglobin 07/25/2016 16.2  13.2 - 17.1 g/dL Final  . HCT 07/25/2016 47.0  38.5 - 50.0 % Final  . MCV 07/25/2016 93.1  80.0 - 100.0 fL Final  . MCH 07/25/2016 32.1  27.0 - 33.0 pg Final  . MCHC 07/25/2016 34.5  32.0 - 36.0 g/dL Final  . RDW 07/25/2016 14.4  11.0 - 15.0 % Final  . Platelets 07/25/2016 250  140 - 400 K/uL Final  . MPV 07/25/2016 9.5  7.5 - 12.5 fL Final  . Neutro Abs 07/25/2016 3403  1,500 - 7,800 cells/uL Final  . Lymphs Abs 07/25/2016 3652  850 - 3,900 cells/uL Final  . Monocytes Absolute 07/25/2016 996* 200 - 950 cells/uL Final  . Eosinophils Absolute 07/25/2016 249  15 - 500  cells/uL Final  . Basophils Absolute 07/25/2016 0  0 - 200 cells/uL Final  . Neutrophils Relative % 07/25/2016 41  % Final  . Lymphocytes Relative 07/25/2016 44  % Final  . Monocytes Relative 07/25/2016 12  % Final  . Eosinophils Relative 07/25/2016 3  % Final  . Basophils Relative 07/25/2016 0  % Final  . Smear Review 07/25/2016 Criteria for review not met   Final  . Sodium 07/25/2016 138  135 - 146 mmol/L Final  . Potassium 07/25/2016 4.5  3.5 - 5.3 mmol/L Final  . Chloride 07/25/2016 102  98 - 110 mmol/L Final  . CO2 07/25/2016 23  20 - 31 mmol/L Final  . Glucose, Bld 07/25/2016 84  65 - 99 mg/dL Final  .  BUN 07/25/2016 11  7 - 25 mg/dL Final  . Creat 07/25/2016 1.06  0.60 - 1.35 mg/dL Final  . Total Bilirubin 07/25/2016 1.4* 0.2 - 1.2 mg/dL Final  . Alkaline Phosphatase 07/25/2016 75  40 - 115 U/L Final  . AST 07/25/2016 88* 10 - 40 U/L Final  . ALT 07/25/2016 139* 9 - 46 U/L Final  . Total Protein 07/25/2016 7.8  6.1 - 8.1 g/dL Final  . Albumin 07/25/2016 4.8  3.6 - 5.1 g/dL Final  . Calcium 07/25/2016 10.0  8.6 - 10.3 mg/dL Final  . GFR, Est African American 07/25/2016 >89  >=60 mL/min Final  . GFR, Est Non African American 07/25/2016 >89  >=60 mL/min Final  Office Visit on 06/27/2016  Component Date Value Ref Range Status  . WBC 06/27/2016 6.9  3.8 - 10.8 K/uL Final  . RBC 06/27/2016 4.80  4.20 - 5.80 MIL/uL Final  . Hemoglobin 06/27/2016 15.3  13.2 - 17.1 g/dL Final  . HCT 06/27/2016 44.6  38.5 - 50.0 % Final  . MCV 06/27/2016 92.9  80.0 - 100.0 fL Final  . MCH 06/27/2016 31.9  27.0 - 33.0 pg Final  . MCHC 06/27/2016 34.3  32.0 - 36.0 g/dL Final  . RDW 06/27/2016 14.1  11.0 - 15.0 % Final  . Platelets 06/27/2016 205  140 - 400 K/uL Final  . MPV 06/27/2016 9.7  7.5 - 12.5 fL Final  . Neutro Abs 06/27/2016 3243  1,500 - 7,800 cells/uL Final  . Lymphs Abs 06/27/2016 2829  850 - 3,900 cells/uL Final  . Monocytes Absolute 06/27/2016 690  200 - 950 cells/uL Final  .  Eosinophils Absolute 06/27/2016 138  15 - 500 cells/uL Final  . Basophils Absolute 06/27/2016 0  0 - 200 cells/uL Final  . Neutrophils Relative % 06/27/2016 47  % Final  . Lymphocytes Relative 06/27/2016 41  % Final  . Monocytes Relative 06/27/2016 10  % Final  . Eosinophils Relative 06/27/2016 2  % Final  . Basophils Relative 06/27/2016 0  % Final  . Smear Review 06/27/2016 Criteria for review not met   Final  . Sodium 06/27/2016 138  135 - 146 mmol/L Final  . Potassium 06/27/2016 4.4  3.5 - 5.3 mmol/L Final  . Chloride 06/27/2016 104  98 - 110 mmol/L Final  . CO2 06/27/2016 24  20 - 31 mmol/L Final  . Glucose, Bld 06/27/2016 83  65 - 99 mg/dL Final  . BUN 06/27/2016 13  7 - 25 mg/dL Final  . Creat 06/27/2016 0.82  0.60 - 1.35 mg/dL Final  . Total Bilirubin 06/27/2016 0.8  0.2 - 1.2 mg/dL Final  . Alkaline Phosphatase 06/27/2016 70  40 - 115 U/L Final  . AST 06/27/2016 76* 10 - 40 U/L Final  . ALT 06/27/2016 118* 9 - 46 U/L Final  . Total Protein 06/27/2016 7.1  6.1 - 8.1 g/dL Final  . Albumin 06/27/2016 4.5  3.6 - 5.1 g/dL Final  . Calcium 06/27/2016 9.4  8.6 - 10.3 mg/dL Final  . GFR, Est African American 06/27/2016 >89  >=60 mL/min Final  . GFR, Est Non African American 06/27/2016 >89  >=60 mL/min Final     Imaging: No results found.  Speciality Comments: No specialty comments available.    Procedures:  No procedures performed Allergies: Patient has no known allergies.   Assessment / Plan:     Visit Diagnoses: Psoriatic arthritis ([place])  Other psoriasis  High risk medication use    Plan: #  1: Psoriatic arthritis. Failure of Enbrel despite using it for 6 months Had to stop methotrexate due to increased LFTs Can't use prednisone due to mood swings Note: Patient failed Humira in the past.  Dactylitis to bilateral third digits Bilateral hip pain, hand pain, knee pain, ankle and feet pain, hip pain.  In addition, patient fell and "collapsed and patient using cane  for assistance. Patient did not seek medical attention. Does not think he broke anything.  #2: Psoriasis. Patches to beard area, scalp   #3: High risk prescription Enbrel every week Methotrexate 0.6 ML's every week ===> stopped her methotrexate completely due to abnormal level of LFTs Folic acid 2 mg daily  AST 06/27/2016 76*  ALT 06/27/2016 118*   AST 07/25/2016 88*  ALT 07/25/2016 139*   AST 08/15/2016 71*  ALT 08/15/2016 106*   AST 09/12/2016 50*  ALT 09/12/2016 88*    #4: Peripheral edema  #5: Return to clinic in 5 months  #6: Patient has a history of OA of bilateral knees. At the last visit, we did discussed Visco supplementation. Patient is having knee pain and was wondering whether or not the Visco supplementation has been approved for him. I will investigate and notify the patient.  #7:  Refer to dr. Benson Norway to eval and tx of incr LFT's . Pt was advised to go today (Dr Benson Norway) will see him.  #8:  Consider cosentyx and stop enbrel due to failiure. Can't use prednisone b/c pt gets mood swings. Can't use mtx due to incr LFT's.  #9: Return to clinic in 3 months  Orders: No orders of the defined types were placed in this encounter.  No orders of the defined types were placed in this encounter.   Face-to-face time spent with patient was 30 minutes. 50% of time was spent in counseling and coordination of care.  Follow-Up Instructions: No Follow-up on file.   Eliezer Lofts, PA-C  Patient continues to have active disease despite being on Enbrel for several months now. He had detailed discussion regarding different treatment options and their side effects. We will apply for Cosyntex. He will be evaluated by Dr. Benson Norway today for elevated LFTs. I examined and evaluated the patient with Eliezer Lofts PA. The plan of care was discussed as noted above.  Bo Merino, MD Note - This record has been created using Editor, commissioning.  Chart creation errors have been  sought, but may not always  have been located. Such creation errors do not reflect on  the standard of medical care.

## 2016-09-24 NOTE — Progress Notes (Signed)
Pharmacy Note  Subjective:  Patient presents today to the [facility] to see Dr. Gabrielle Dareeveshwar/Mr. Panwala.  Patient has been on Enbrel since 03/27/16.  Decision was made to change the patient to Cosentyx.  Patient was seen by the pharmacist for counseling on Cosentyx.  Objective:  CBC    Component Value Date/Time   WBC 7.9 09/12/2016 1446   RBC 4.97 09/12/2016 1446   HGB 15.2 09/12/2016 1446   HCT 45.9 09/12/2016 1446   PLT 208 09/12/2016 1446   MCV 92.4 09/12/2016 1446   MCH 30.6 09/12/2016 1446   MCHC 33.1 09/12/2016 1446   RDW 13.9 09/12/2016 1446   LYMPHSABS 3,318 09/12/2016 1446   MONOABS 553 09/12/2016 1446   EOSABS 158 09/12/2016 1446   BASOSABS 0 09/12/2016 1446   CMP     Component Value Date/Time   NA 138 09/12/2016 1446   K 4.5 09/12/2016 1446   CL 103 09/12/2016 1446   CO2 24 09/12/2016 1446   GLUCOSE 103 (H) 09/12/2016 1446   BUN 21 09/12/2016 1446   CREATININE 1.38 (H) 09/12/2016 1446   CALCIUM 9.5 09/12/2016 1446   PROT 7.0 09/12/2016 1446   ALBUMIN 4.6 09/12/2016 1446   AST 50 (H) 09/12/2016 1446   ALT 88 (H) 09/12/2016 1446   ALKPHOS 72 09/12/2016 1446   BILITOT 0.8 09/12/2016 1446   GFRNONAA 66 09/12/2016 1446   GFRAA 77 09/12/2016 1446   TB Gold: negative (08/15/16) Hepatitis panel: negative (10/10/15) HIV: negative (10/10/15) SPEP: No abnormal protein bands present (10/10/15) Immunoglobulin: normal (10/10/15)  Assessment/Plan:  Counseled patient that Cosentyx is a IL-17 inhibitor that works to reduce pain and inflammation associated with arthritis.  Counseled patient on purpose, proper use, and adverse effects of Cosentyx. Reviewed the most common adverse effects of infection, inflammatory bowel disease, and allergic reaction.  Provided patient with medication education material and answered all questions.  Patient consented to Cosentyx.  Will upload consent into patine'ts chart.  Will apply for Cosentyx through patient's insurance.  Reviewed  storage information for Cosentyx.  Advised patient that he will need a nursing visit for the initial Cosentyx injection.  Patient voiced understanding.  Patient had most recent Enbrel injection on Monday, 09/22/16.  Advised initial Cosentyx cannot be given until one week after most recent Enbrel.    Assisted patient in signing up for Cosentyx copay card (ID: [id number], GRP [id number], BIN [id number], PCN [id number]).    Lilla Shookachel Magdelene Ruark, Pharm.D., BCPS Clinical Pharmacist Pager: [phone] Phone: [phone] 09/24/2016 1:41 PM

## 2016-09-24 NOTE — Patient Instructions (Signed)
Standing Labs We placed an order today for your standing lab work.    Please come back and get your standing labs in 1 month then every 2 months  We have open lab Monday through Friday from 8:30-11:30 AM and 1:30-4 PM at the office of Dr. Arbutus PedShaili Deveshwar/Naitik Panwala, PA.   The office is located at [address], [address], [place], [place] [address] No appointment is necessary.   Labs are drawn by [facility].  You may receive a bill from [facility] for your lab work. If you have any questions regarding directions or hours of operation,  please call [phone].    Secukinumab injection What is this medicine? SECUKINUMAB (sek ue KIN ue mab) is used to treat psoriasis. It is also used to treat psoriatic arthritis and ankylosing spondylitis. This medicine may be used for other purposes; ask your health care provider or pharmacist if you have questions. COMMON BRAND NAME(S): Cosentyx What should I tell my health care provider before I take this medicine? They need to know if you have any of these conditions: -Crohn's disease, ulcerative colitis, or other inflammatory bowel disease -infection or history of infection -other conditions affecting the immune system -recently received or are scheduled to receive a vaccine -tuberculosis, a positive skin test for tuberculosis, or have recently been in close contact with someone who has tuberculosis -an unusual or allergic reaction to secukinumab, other medicines, latex, rubber, foods, dyes, or preservatives -pregnant or trying to get pregnant -breast-feeding How should I use this medicine? This medicine is for injection under the skin. It may be administered by a healthcare professional in a hospital or clinic setting or at home. If you get this medicine at home, you will be taught how to prepare and give this medicine. Use exactly as directed. Take your medicine at regular intervals. Do not take your medicine more often than directed. It is  important that you put your used needles and syringes in a special sharps container. Do not put them in a trash can. If you do not have a sharps container, call your pharmacist or healthcare provider to get one. A special MedGuide will be given to you by the pharmacist with each prescription and refill. Be sure to read this information carefully each time. Talk to your pediatrician regarding the use of this medicine in children. Special care may be needed. Overdosage: If you think you have taken too much of this medicine contact a poison control center or emergency room at once. NOTE: This medicine is only for you. Do not share this medicine with others. What if I miss a dose? It is important not to miss your dose. Call your doctor of health care professional if you are unable to keep an appointment. If you give yourself the medicine and you miss a dose, take it as soon as you can. If it is almost time for your next dose, take only that dose. Do not take double or extra doses. What may interact with this medicine? Do not take this medicine with any of the following medications: -live virus vaccines This medicine may also interact with the following medications: -cyclosporine -inactivated vaccines -warfarin This list may not describe all possible interactions. Give your health care provider a list of all the medicines, herbs, non-prescription drugs, or dietary supplements you use. Also tell them if you smoke, drink alcohol, or use illegal drugs. Some items may interact with your medicine. What should I watch for while using this medicine? Tell your doctor  or healthcare professional if your symptoms do not start to get better or if they get worse. You will be tested for tuberculosis (TB) before you start this medicine. If your doctor prescribes any medicine for TB, you should start taking the TB medicine before starting this medicine. Make sure to finish the full course of TB medicine. Call your  doctor or healthcare professional for advice if you get a fever, chills or sore throat, or other symptoms of a cold or flu. Do not treat yourself. This drug decreases your body's ability to fight infections. Try to avoid being around people who are sick. This medicine can decrease the response to a vaccine. If you need to get vaccinated, tell your healthcare professional if you have received this medicine within the last 6 months. Extra booster doses may be needed. Talk to your doctor to see if a different vaccination schedule is needed. What side effects may I notice from receiving this medicine? Side effects that you should report to your doctor or health care professional as soon as possible: -allergic reactions like skin rash, itching or hives, swelling of the face, lips, or tongue -signs and symptoms of infection like fever or chills; cough; sore throat; pain or trouble passing urine Side effects that usually do not require medical attention (report to your doctor or health care professional if they continue or are bothersome): -diarrhea This list may not describe all possible side effects. Call your doctor for medical advice about side effects. You may report side effects to FDA at 1-800-FDA-1088. Where should I keep my medicine? Keep out of the reach of children. Store the prefilled syringe or injection pen in a refrigerator between 2 to 8 degrees C (36 to 46 degrees F). Keep the syringe or the pen in the original carton until ready for use. Protect from light. Do not freeze. Do not shake. Prior to use, remove the syringe or pen from the refrigerator and use within 1 hour. Throw away any unused medicine after the expiration date on the label. NOTE: This sheet is a summary. It may not cover all possible information. If you have questions about this medicine, talk to your doctor, pharmacist, or health care provider.  2018 Elsevier/Gold Standard (2015-05-03 11:48:31)

## 2016-09-24 NOTE — Telephone Encounter (Signed)
Submitted a prior authorization for Cosentyx through cover my meds. We received a confirmation of approval from 09/24/2016 through 09/24/2017.   Case ID: [id number] Phone number: [phone]  [facility] with [facility], a representative from [facility] [employer], to verify that the loading dose was covered. A processed a prior authorization for the loading dose over the phone.  Loading dose has been approved from 09/24/2016 till 10/22/2016. She states that [facility] is their pharmacy but we can call this number to find other preferred pharmacies he can have it filled at [phone]  Reference number: [id number]  Abran DukeHopkins, Liora Myles, CPhT 3:09 PM

## 2016-09-25 MED ORDER — SECUKINUMAB 150 MG/ML ~~LOC~~ SOAJ: 300.0000 mg | SUBCUTANEOUS | 0 refills | Status: DC

## 2016-09-25 MED FILL — COSENTYX 300 MG DOSE-2 PENS: 150 | 28 days supply | Qty: 8 | Fill #0

## 2016-09-25 NOTE — Telephone Encounter (Signed)
I informed patient that Cosentyx was approved by his insurance.  Last Enbrel injection was 09/22/16.  Patient scheduled nurse visit for 09/29/16 at 3 PM for initial Cosentyx injection.  Discussed trying to get prescription at local specialty pharmacy for initial fill so it can be delivered to clinic for clinic administration and patient will have full loading dose.  In order to get local fill, patient must call [phone] for one time override.  Patient is very interested in this option so he can start medication asap and not have any delay in therapy.  If patient is unable to get prescription local, will start with sample medication then send prescription to preferred specialty pharmacy.  Patient voiced understanding of plan.    Lilla Shookachel Jerzee Jerome, Pharm.D., BCPS, CPP Clinical Pharmacist Pager: [phone] Phone: [phone] 09/25/2016 11:00 AM

## 2016-09-26 ENCOUNTER — Telehealth: Payer: Self-pay

## 2016-09-26 NOTE — Telephone Encounter (Signed)
Coordinated delivery of Cosentyx loading dose with [facility] to the Clinic. Medication has been placed in refrigerator.   Daveyon Kitchings, [place], CPhT 1:27 PM

## 2016-09-29 ENCOUNTER — Ambulatory Visit (INDEPENDENT_AMBULATORY_CARE_PROVIDER_SITE_OTHER): Payer: BLUE CROSS/BLUE SHIELD | Admitting: *Deleted

## 2016-09-29 VITALS — BP 136/97 | HR 101

## 2016-09-29 DIAGNOSIS — L4050 Arthropathic psoriasis, unspecified: Secondary | ICD-10-CM

## 2016-09-29 MED ORDER — SECUKINUMAB 150 MG/ML ~~LOC~~ SOAJ: 300.0000 mg | SUBCUTANEOUS | 0 refills | Status: DC

## 2016-09-29 MED ORDER — SECUKINUMAB 150 MG/ML ~~LOC~~ SOAJ
300.0000 mg | Freq: Once | SUBCUTANEOUS | Status: AC
  Administered 2016-09-29: 300 mg via SUBCUTANEOUS

## 2016-09-29 NOTE — Progress Notes (Signed)
Pharmacy Note  Subjective:   Patient is being initiated on Cosentyx.  Patient was previously counseled extensively on Cosentyx on 09/24/16 and consented to initiation of Cosentyx at that time.  Patient presents to clinic today to receive the first dose of Cosentyx.  He confirms most recent Enbrel dose was one week ago.       Objective: CMP     Component Value Date/Time   NA 138 09/12/2016 1446   K 4.5 09/12/2016 1446   CL 103 09/12/2016 1446   CO2 24 09/12/2016 1446   GLUCOSE 103 (H) 09/12/2016 1446   BUN 21 09/12/2016 1446   CREATININE 1.38 (H) 09/12/2016 1446   CALCIUM 9.5 09/12/2016 1446   PROT 7.0 09/12/2016 1446   ALBUMIN 4.6 09/12/2016 1446   AST 50 (H) 09/12/2016 1446   ALT 88 (H) 09/12/2016 1446   ALKPHOS 72 09/12/2016 1446   BILITOT 0.8 09/12/2016 1446   GFRNONAA 66 09/12/2016 1446   GFRAA 77 09/12/2016 1446   CBC    Component Value Date/Time   WBC 7.9 09/12/2016 1446   RBC 4.97 09/12/2016 1446   HGB 15.2 09/12/2016 1446   HCT 45.9 09/12/2016 1446   PLT 208 09/12/2016 1446   MCV 92.4 09/12/2016 1446   MCH 30.6 09/12/2016 1446   MCHC 33.1 09/12/2016 1446   RDW 13.9 09/12/2016 1446   LYMPHSABS 3,318 09/12/2016 1446   MONOABS 553 09/12/2016 1446   EOSABS 158 09/12/2016 1446   BASOSABS 0 09/12/2016 1446   TB Gold: negative (08/15/16)  Assessment/Plan:  Patient was counseled on how to administer subcutaneous Cosentyx injection using a demonstration pen.  Patient received initial Cosentyx dose.  Patient was monitored for 30 minutes post injection.  No injection site reaction noted.  Patient will need standing lab orders in one month.  Provided patient with standing lab instructions and placed standing lab order.    The pharmacy was only able to fill the first 4 weeks of the Cosentyx loading dose as they could not fill greater than 30 day supply.  Will send in prescription of fifth injection of loading dose to [facility].  Once patient completes Cosentyx  loading dose he will start maintenance dose of Cosentyx 300 mg every 4 weeks.    Lilla Shookachel Henderson, Pharm.D., BCPS Clinical Pharmacist Pager: [phone] Phone: [phone] 09/29/2016 3:27 PM

## 2016-09-29 NOTE — Progress Notes (Signed)
Patient in office for initial start to Cosentyx. Patient was given injections to right and left thighs. Patient provided medication. Patient tolerated injection well. Patient monitored in office for 30 minutes after administration of injection for adverse reactions. No adverse reactions noted  Administrations This Visit    Secukinumab SOAJ 300 mg    Admin Date 09/29/2016 Action Given Dose 300 mg Route Subcutaneous Administered By Henriette CombsHatton, Winifred Balogh L, LPN

## 2016-09-29 NOTE — Patient Instructions (Addendum)
Inject Cosentyx 300 mg (2 of the 150 mg pens) weekly for week 0, 1, 2, 3, 4 (today, 10/06/16, 10/13/16, 10/20/16, 10/27/16) then every 4 weeks starting 11/24/16  Standing Labs We placed an order today for your standing lab work.    Please come back and get your standing labs in 1 month then every 2 months  We have open lab Monday through Friday from 8:30-11:30 AM and 1:30-4 PM at the office of Dr. Arbutus Ped, PA.   The office is located at [address], [place], [place] [address] No appointment is necessary.   Labs are drawn by [facility].  You may receive a bill from [facility] for your lab work. If you have any questions regarding directions or hours of operation,  please call [phone].    Secukinumab injection What is this medicine? SECUKINUMAB (sek ue KIN ue mab) is used to treat psoriasis. It is also used to treat psoriatic arthritis and ankylosing spondylitis. This medicine may be used for other purposes; ask your health care provider or pharmacist if you have questions. COMMON BRAND NAME(S): Cosentyx What should I tell my health care provider before I take this medicine? They need to know if you have any of these conditions: -Crohn's disease, ulcerative colitis, or other inflammatory bowel disease -infection or history of infection -other conditions affecting the immune system -recently received or are scheduled to receive a vaccine -tuberculosis, a positive skin test for tuberculosis, or have recently been in close contact with someone who has tuberculosis -an unusual or allergic reaction to secukinumab, other medicines, latex, rubber, foods, dyes, or preservatives -pregnant or trying to get pregnant -breast-feeding How should I use this medicine? This medicine is for injection under the skin. It may be administered by a healthcare professional in a hospital or clinic setting or at home. If you get this medicine at home, you will be taught how to prepare  and give this medicine. Use exactly as directed. Take your medicine at regular intervals. Do not take your medicine more often than directed. It is important that you put your used needles and syringes in a special sharps container. Do not put them in a trash can. If you do not have a sharps container, call your pharmacist or healthcare provider to get one. A special MedGuide will be given to you by the pharmacist with each prescription and refill. Be sure to read this information carefully each time. Talk to your pediatrician regarding the use of this medicine in children. Special care may be needed. Overdosage: If you think you have taken too much of this medicine contact a poison control center or emergency room at once. NOTE: This medicine is only for you. Do not share this medicine with others. What if I miss a dose? It is important not to miss your dose. Call your doctor of health care professional if you are unable to keep an appointment. If you give yourself the medicine and you miss a dose, take it as soon as you can. If it is almost time for your next dose, take only that dose. Do not take double or extra doses. What may interact with this medicine? Do not take this medicine with any of the following medications: -live virus vaccines This medicine may also interact with the following medications: -cyclosporine -inactivated vaccines -warfarin This list may not describe all possible interactions. Give your health care provider a list of all the medicines, herbs, non-prescription drugs, or dietary supplements you use. Also  tell them if you smoke, drink alcohol, or use illegal drugs. Some items may interact with your medicine. What should I watch for while using this medicine? Tell your doctor or healthcare professional if your symptoms do not start to get better or if they get worse. You will be tested for tuberculosis (TB) before you start this medicine. If your doctor prescribes any  medicine for TB, you should start taking the TB medicine before starting this medicine. Make sure to finish the full course of TB medicine. Call your doctor or healthcare professional for advice if you get a fever, chills or sore throat, or other symptoms of a cold or flu. Do not treat yourself. This drug decreases your body's ability to fight infections. Try to avoid being around people who are sick. This medicine can decrease the response to a vaccine. If you need to get vaccinated, tell your healthcare professional if you have received this medicine within the last 6 months. Extra booster doses may be needed. Talk to your doctor to see if a different vaccination schedule is needed. What side effects may I notice from receiving this medicine? Side effects that you should report to your doctor or health care professional as soon as possible: -allergic reactions like skin rash, itching or hives, swelling of the face, lips, or tongue -signs and symptoms of infection like fever or chills; cough; sore throat; pain or trouble passing urine Side effects that usually do not require medical attention (report to your doctor or health care professional if they continue or are bothersome): -diarrhea This list may not describe all possible side effects. Call your doctor for medical advice about side effects. You may report side effects to FDA at 1-800-FDA-1088. Where should I keep my medicine? Keep out of the reach of children. Store the prefilled syringe or injection pen in a refrigerator between 2 to 8 degrees C (36 to 46 degrees F). Keep the syringe or the pen in the original carton until ready for use. Protect from light. Do not freeze. Do not shake. Prior to use, remove the syringe or pen from the refrigerator and use within 1 hour. Throw away any unused medicine after the expiration date on the label. NOTE: This sheet is a summary. It may not cover all possible information. If you have questions about this  medicine, talk to your doctor, pharmacist, or health care provider.  2018 Elsevier/Gold Standard (2015-05-03 11:48:31)

## 2016-09-29 NOTE — Telephone Encounter (Signed)
Talked to patient and advised

## 2016-09-30 ENCOUNTER — Other Ambulatory Visit: Payer: Self-pay | Admitting: *Deleted

## 2016-09-30 MED ORDER — ALLOPURINOL 300 MG PO TABS: 300.0000 mg | ORAL_TABLET | Freq: Every day | ORAL | 1 refills | Status: DC

## 2016-09-30 NOTE — Telephone Encounter (Signed)
Refill request received via fax  Last Visit: 09/24/16 Next Visit: 12/25/16 Labs: 09/12/16 Elevated LFTs (referral made to GI) AST 50 ALT 88 Previous AST 71 ALT 106  Okay to refill Allopurinol?

## 2016-10-01 ENCOUNTER — Ambulatory Visit
Admission: RE | Admit: 2016-10-01 | Discharge: 2016-10-01 | Disposition: A | Discharge status: Home / Self Care | Payer: BLUE CROSS/BLUE SHIELD | Source: Ambulatory Visit | Attending: Gastroenterology | Admitting: Gastroenterology

## 2016-10-01 DIAGNOSIS — R945 Abnormal results of liver function studies: Principal | ICD-10-CM

## 2016-10-01 DIAGNOSIS — R7989 Other specified abnormal findings of blood chemistry: Secondary | ICD-10-CM

## 2016-10-24 ENCOUNTER — Telehealth: Payer: Self-pay | Admitting: Pharmacist

## 2016-10-24 ENCOUNTER — Other Ambulatory Visit: Payer: Self-pay

## 2016-10-24 DIAGNOSIS — Z79899 Other long term (current) drug therapy: Secondary | ICD-10-CM

## 2016-10-24 LAB — CBC WITH DIFFERENTIAL/PLATELET
BASOS PCT: 0 %
Basophils Absolute: 0 cells/uL (ref 0–200)
EOS ABS: 186 {cells}/uL (ref 15–500)
Eosinophils Relative: 3 %
HEMATOCRIT: 47.1 % (ref 38.5–50.0)
Hemoglobin: 15.9 g/dL (ref 13.2–17.1)
LYMPHS PCT: 45 %
Lymphs Abs: 2790 cells/uL (ref 850–3900)
MCH: 31.1 pg (ref 27.0–33.0)
MCHC: 33.8 g/dL (ref 32.0–36.0)
MCV: 92.2 fL (ref 80.0–100.0)
MONO ABS: 558 {cells}/uL (ref 200–950)
MPV: 9.2 fL (ref 7.5–12.5)
Monocytes Relative: 9 %
NEUTROS PCT: 43 %
Neutro Abs: 2666 cells/uL (ref 1500–7800)
Platelets: 227 10*3/uL (ref 140–400)
RBC: 5.11 MIL/uL (ref 4.20–5.80)
RDW: 13.1 % (ref 11.0–15.0)
WBC: 6.2 10*3/uL (ref 3.8–10.8)

## 2016-10-24 LAB — COMPLETE METABOLIC PANEL WITH GFR
ALBUMIN: 4.7 g/dL (ref 3.6–5.1)
ALK PHOS: 74 U/L (ref 40–115)
ALT: 114 U/L — AB (ref 9–46)
AST: 66 U/L — ABNORMAL HIGH (ref 10–40)
BUN: 12 mg/dL (ref 7–25)
CALCIUM: 9.6 mg/dL (ref 8.6–10.3)
CHLORIDE: 103 mmol/L (ref 98–110)
CO2: 23 mmol/L (ref 20–31)
CREATININE: 1.06 mg/dL (ref 0.60–1.35)
GFR, Est African American: >89 mL/min (ref >=60)
GFR, Est Non African American: >89 mL/min (ref >=60)
Glucose, Bld: 98 mg/dL (ref 65–99)
Potassium: 4.8 mmol/L (ref 3.5–5.3)
Sodium: 136 mmol/L (ref 135–146)
Total Bilirubin: 0.9 mg/dL (ref 0.2–1.2)
Total Protein: 7.2 g/dL (ref 6.1–8.1)

## 2016-10-24 NOTE — Telephone Encounter (Signed)
Patient came by the office to have labs drawn today.  He reports [facility] would not fill his Cosentyx dose until August, and he needs a dose of Cosentyx to complete his loading dose on Monday, 10/27/16.  Discussed with Dr. Corliss Skainseveshwar who agreed to provide patient with a sample of Cosentyx.    Medication Samples have been provided to the patient.  Drug name: Cosentyx       Strength: 300 mg        Qty: 1  LOT: SDE  Exp.Date: 02/2018  Dosing instructions: Inject under the skin on 10/27/16 for week 5 of loading dose.    The patient has been instructed regarding the correct time, dose, and frequency of taking this medication, including desired effects and most common side effects.  Patient will complete loading dose of Cosentyx on 10/27/16 then start Cosentyx 300 mg every 4 weeks starting 11/24/16.  Advised patient to contact us if he has problems with his Cosentyx prescription in August.  He voiced understanding.    Lilla Shookachel Henderson, Pharm.D., BCPS, CPP Clinical Pharmacist Pager: [phone] Phone: [phone] 10/24/2016 10:43 AM

## 2016-10-25 NOTE — Progress Notes (Signed)
LFTs higher. Please, check if pt. Saw Dr. Elnoria HowardHung. He was referred last month.

## 2016-11-06 ENCOUNTER — Other Ambulatory Visit: Payer: Self-pay | Admitting: Rheumatology

## 2016-11-06 MED ORDER — COLCHICINE 0.6 MG PO TABS
0.6000 mg | ORAL_TABLET | Freq: Two times a day (BID) | ORAL | 3 refills | Status: DC
Start: 2016-11-06 — End: 2017-07-13

## 2016-11-06 NOTE — Telephone Encounter (Signed)
Patient needs a refill on Colchicine. Patient uses [facility] in [place].

## 2016-11-06 NOTE — Telephone Encounter (Signed)
ok 

## 2016-11-06 NOTE — Telephone Encounter (Signed)
Last Visit: 09/24/16 Next Visit: 12/25/16 Labs: 09/12/16 Elevated LFTs (referral made to GI) AST 50 ALT 88 Previous AST 71 ALT 106  Okay to refill Colchicine?

## 2016-11-27 ENCOUNTER — Telehealth: Payer: Self-pay | Admitting: Radiology

## 2016-11-27 MED ORDER — SECUKINUMAB 150 MG/ML ~~LOC~~ SOAJ: 300.0000 mg | SUBCUTANEOUS | 0 refills | Status: DC

## 2016-11-27 NOTE — Telephone Encounter (Signed)
09/24/16 last visit  12/22/16 next visit    CBC Latest Ref Rng & Units 10/24/2016 09/12/2016 08/15/2016  WBC 3.8 - 10.8 K/uL 6.2 7.9 8.4  Hemoglobin 13.2 - 17.1 g/dL 14.715.9 82.915.2 56.216.0  Hematocrit 38.5 - 50.0 % 47.1 45.9 47.2  Platelets 140 - 400 K/uL 227 208 237   CMP Latest Ref Rng & Units 10/24/2016 09/12/2016 08/15/2016  Glucose 65 - 99 mg/dL 98 130(Q103(H) 77  BUN 7 - 25 mg/dL 12 21 14   Creatinine 0.60 - 1.35 mg/dL 6.571.06 8.46(N1.38(H) 6.291.05  Sodium 135 - 146 mmol/L 136 138 140  Potassium 3.5 - 5.3 mmol/L 4.8 4.5 4.6  Chloride 98 - 110 mmol/L 103 103 102  CO2 20 - 31 mmol/L 23 24 25   Calcium 8.6 - 10.3 mg/dL 9.6 9.5 52.810.0  Total Protein 6.1 - 8.1 g/dL 7.2 7.0 7.4  Total Bilirubin 0.2 - 1.2 mg/dL 0.9 0.8 1.0  Alkaline Phos 40 - 115 U/L 74 72 71  AST 10 - 40 U/L 66(H) 50(H) 71(H)  ALT 9 - 46 U/L 114(H) 88(H) 106(H)   08/15/16 negative TB gold  Ok to refill per Dr Corliss Skainseveshwar

## 2016-11-27 NOTE — Telephone Encounter (Signed)
Refill request received via fax for Cosentyx from [facility]

## 2016-12-18 NOTE — Progress Notes (Signed)
Office Visit Note  Patient: Miguel Evans             Date of Birth: Oct 21, 1980           MRN: [id number]             PCP: Nonnie DoneSlatosky, John J., MD Referring: Nonnie DoneSlatosky, John J., MD Visit Date: 12/25/2016 Occupation: @GUAROCC @    Subjective:  Medication Management; Joint Pain; and has disability forms   History of Present Illness: Miguel Evans is a 36 y.o. male with history of psoriatic arthritis and psoriasis. He was started on Cosyntex on 09/29/2016. He states she's been taking Cosyntex on regular basis. He states he did really well for the next 6 weeks after starting the Cosyntex but gradually the pain has come back. He continues to have joint pain and joint swelling. He believes that the symptoms flare just prior to his next Cosyntex injection. His psoriasis is improved except for some lesions in his scalp and in the beard area. He has not had any gout flare. He states he continues to have insomnia, fatigue and morning stiffness. His been seeing Dr. Marcelle OverlieHolland for elevated LFTs who has diagnosed it to be fatty liver. He is advised weight loss. Patient states that he is in constant pain. He states he cannot work due to fatigue and generalized pain. He wants to extend his long-term disability for right now.  Activities of Daily Living:  Patient reports morning stiffness for 1 hour .   Patient Reports nocturnal pain.  Difficulty dressing/grooming: Reports Difficulty climbing stairs: Reports Difficulty getting out of chair: Reports Difficulty using hands for taps, buttons, cutlery, and/or writing: Reports   Review of Systems  Constitutional: Positive for fatigue. Negative for night sweats and weakness ( ).  HENT: Negative.  Negative for mouth sores, mouth dryness and nose dryness.   Eyes: Negative.  Negative for redness and dryness.  Respiratory: Positive for shortness of breath. Negative for difficulty breathing.        With exertion  Cardiovascular: Negative.  Negative for chest pain,  palpitations, hypertension, irregular heartbeat and swelling in legs/feet.  Gastrointestinal: Negative.  Negative for constipation and diarrhea.  Endocrine: Negative.  Negative for increased urination.  Genitourinary:       Has trouble starting stream for urination at times  Musculoskeletal: Positive for arthralgias, joint pain, joint swelling, myalgias, morning stiffness and myalgias. Negative for gait problem, muscle weakness and muscle tenderness.  Skin: Negative.  Negative for color change, rash, hair loss, nodules/bumps, skin tightness, ulcers and sensitivity to sunlight.  Allergic/Immunologic: Negative for susceptible to infections.  Neurological: Positive for light-headedness. Negative for dizziness, fainting, memory loss and night sweats.  Hematological: Negative.  Negative for swollen glands.  Psychiatric/Behavioral: Positive for depressed mood. Negative for sleep disturbance. The patient is not nervous/anxious.     PMFS History:  Patient Active Problem List   Diagnosis Date Noted  . Hyperuricemia 03/26/2016  . Elevated blood pressure  03/26/2016  . Elevated LFTs 03/26/2016  . Anxiety 03/26/2016  . Psoriatic arthritis (HCC) 03/25/2016  . High risk medication use 03/25/2016  . Neck pain 03/25/2016  . Hand pain 03/25/2016  . 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 Anxiety   . Gout   . Hypertension     Family History  Problem Relation Age of Onset  . Cancer Mother   . Bipolar disorder Mother   . Hypertension Father   . Osteoarthritis Father   .  Emphysema Maternal Grandfather   . CAD Paternal Grandfather    Past Surgical History:  Procedure Laterality Date  . ANTERIOR CRUCIATE LIGAMENT REPAIR Bilateral Left in 2002, Right 2013  . VASECTOMY     Social History   Social History Narrative  . No narrative on file     Objective: Vital Signs: BP 140/84   Pulse 86   Resp 18   Ht  (1.905 m)   Wt (!) 318 lb (144.2 kg)    BMI 39.75 kg/m    Physical Exam  Constitutional: He is oriented to person, place, and time. He appears well-developed and well-nourished.  HENT:  Head: Normocephalic and atraumatic.  Eyes: Pupils are equal, round, and reactive to light. Conjunctivae and EOM are normal.  Neck: Normal range of motion. Neck supple.  Cardiovascular: Normal rate, regular rhythm and normal heart sounds.   Pulmonary/Chest: Effort normal and breath sounds normal.  Abdominal: Soft. Bowel sounds are normal.  Neurological: He is alert and oriented to person, place, and time.  Skin: Skin is warm and dry. Capillary refill takes less than 2 seconds.  Psychiatric: He has a normal mood and affect. His behavior is normal.  Nursing note and vitals reviewed.    Musculoskeletal Exam: C-spine discomfort with range of motion. Thoracic and lumbar spine discomfort range of motion. Tenderness over SI joint. Shoulder joints although joints wrist joint MCPs PIPs DIPs are good range of motion with no synovitis. Hip joints knee joints ankles MTPs PIPs DIPs with good range of motion with no synovitis.  CDAI Exam: CDAI Homunculus Exam:   Joint Counts:  CDAI Tender Joint count: 0 CDAI Swollen Joint count: 0  Global Assessments:  Patient Global Assessment: 8   CDAI Calculated Score: 8    Investigation: No additional findings.TB Gold: negative in 08/2016 CBC Latest Ref Rng & Units 10/24/2016 09/12/2016 08/15/2016  WBC 3.8 - 10.8 K/uL 6.2 7.9 8.4  Hemoglobin 13.2 - 17.1 g/dL 40.9 81.1 91.4  Hematocrit 38.5 - 50.0 % 47.1 45.9 47.2  Platelets 140 - 400 K/uL 227 208 237   CMP Latest Ref Rng & Units 10/24/2016 09/12/2016 08/15/2016  Glucose 65 - 99 mg/dL 98 782(N) 77  BUN 7 - 25 mg/dL Creatinine 0.60 - 1.35 mg/dL 5.62 1.30(Q) 6.57  Sodium 135 - 146 mmol/L 136 138 140  Potassium 3.5 - 5.3 mmol/L 4.8 4.5 4.6  Chloride 98 - 110 mmol/L 103 103 102  CO2 20 - 31 mmol/L Calcium 8.6 - 10.3 mg/dL 9.6 9.5 84.6  Total  Protein 6.1 - 8.1 g/dL 7.2 7.0 7.4  Total Bilirubin 0.2 - 1.2 mg/dL 0.9 0.8 1.0  Alkaline Phos 40 - 115 U/L 74 72 71  AST 10 - 40 U/L 66(H) 50(H) 71(H)  ALT 9 - 46 U/L 114(H) 88(H) 106(H)    Imaging: No results found.  Speciality Comments: No specialty comments available.    Procedures:  No procedures performed Allergies: Patient has no known allergies.   Assessment / Plan:     Visit Diagnoses: Psoriatic arthritis (HCC) - failed Enbrel, failed Humira, had to stop MTX due to increased LFTs, can't use prednisone due to mood swings. history of dactylitis. Patient has no dactylitis or swelling on examination. He has no synovitis on exam. In my opinion Cosyntex is working well for him without any synovitis on exam. He states he perform his job due to ongoing pain, fatigue, discomfort. He wants to extend his long-term  disability. I will give him an extension until his next follow-up in 3 months. I also offered a referral to [facility] which she declined. I will refer him to pain clinic where he can get possibly tramadol to manage his pain. Due to elevated LFTs he cannot take any anti-inflammatories or Tylenol.   Other psoriasis: He reports intermittent lesions in his scalp and beard area.  High risk medication use - Cosentyx 300 mg sq q week (09/29/2016),  his labs are stable he will need labs every 3 months.   Chronic left SI joint pai: He continues to have tenderness.   Idiopathic chronic gout without tophus, unspecified site - allopurinol. He denies any gout flare.   History of neck pain: Stiffness with range of motion.  Hyperuricemia  Elevated LFTs - referred to Dr. Elnoria Howard to eval and tx. Per patient Dr. Elnoria Howard diagnosed her with fatty liver and advised him weight loss.   History of hypertension : His blood pressure is elevated he is been advised to monitor his blood pressure closely. Weight loss diet and exercise was also discussed.    Orders: No orders of the defined  types were placed in this encounter.  No orders of the defined types were placed in this encounter.   Face-to-face time spent with patient was 30 minutes. greater than 50% of time was spent in counseling and coordination of care.  Follow-Up Instructions: Return in about 6 months (around 06/24/2017) for Psoriatic arthritis Psoriasis.   Pollyann Savoy, MD  Note - This record has been created using Animal nutritionist.  Chart creation errors have been sought, but may not always  have been located. Such creation errors do not reflect on  the standard of medical care.

## 2016-12-25 ENCOUNTER — Ambulatory Visit (INDEPENDENT_AMBULATORY_CARE_PROVIDER_SITE_OTHER): Payer: BLUE CROSS/BLUE SHIELD | Admitting: Rheumatology

## 2016-12-25 ENCOUNTER — Encounter: Payer: Self-pay | Admitting: Rheumatology

## 2016-12-25 VITALS — BP 140/84 | HR 86 | Resp 18 | Ht 75.0 in | Wt 318.0 lb

## 2016-12-25 DIAGNOSIS — R945 Abnormal results of liver function studies: Secondary | ICD-10-CM

## 2016-12-25 DIAGNOSIS — M1A00X0 Idiopathic chronic gout, unspecified site, without tophus (tophi): Secondary | ICD-10-CM | POA: Diagnosis not present

## 2016-12-25 DIAGNOSIS — E790 Hyperuricemia without signs of inflammatory arthritis and tophaceous disease: Secondary | ICD-10-CM

## 2016-12-25 DIAGNOSIS — Z8739 Personal history of other diseases of the musculoskeletal system and connective tissue: Secondary | ICD-10-CM

## 2016-12-25 DIAGNOSIS — L4050 Arthropathic psoriasis, unspecified: Secondary | ICD-10-CM

## 2016-12-25 DIAGNOSIS — R7989 Other specified abnormal findings of blood chemistry: Secondary | ICD-10-CM

## 2016-12-25 DIAGNOSIS — L408 Other psoriasis: Secondary | ICD-10-CM

## 2016-12-25 DIAGNOSIS — Z79899 Other long term (current) drug therapy: Secondary | ICD-10-CM

## 2016-12-25 DIAGNOSIS — Z8679 Personal history of other diseases of the circulatory system: Secondary | ICD-10-CM | POA: Diagnosis not present

## 2016-12-25 DIAGNOSIS — M533 Sacrococcygeal disorders, not elsewhere classified: Secondary | ICD-10-CM

## 2016-12-25 DIAGNOSIS — G8929 Other chronic pain: Secondary | ICD-10-CM

## 2016-12-25 NOTE — Patient Instructions (Signed)
Standing Labs We placed an order today for your standing lab work.    Please come back and get your standing labs in October and every 3 months  We have open lab Monday through Friday from 8:30-11:30 AM and 1:30-4 PM at the office of Dr. Lamonta Cypress.   The office is located at [address], [address], [place], [place] [address] No appointment is necessary.   Labs are drawn by [facility].  You may receive a bill from [facility] for your lab work. If you have any questions regarding directions or hours of operation,  please call [phone].    

## 2017-01-15 ENCOUNTER — Other Ambulatory Visit: Payer: Self-pay

## 2017-01-15 DIAGNOSIS — Z79899 Other long term (current) drug therapy: Secondary | ICD-10-CM

## 2017-01-15 LAB — CBC WITH DIFFERENTIAL/PLATELET
BASOS ABS: 48 {cells}/uL (ref 0–200)
Basophils Relative: 0.7 %
EOS PCT: 2.6 %
Eosinophils Absolute: 177 cells/uL (ref 15–500)
HEMATOCRIT: 45.7 % (ref 38.5–50.0)
HEMOGLOBIN: 15.6 g/dL (ref 13.2–17.1)
Lymphs Abs: 2625 cells/uL (ref 850–3900)
MCH: 30.8 pg (ref 27.0–33.0)
MCHC: 34.1 g/dL (ref 32.0–36.0)
MCV: 90.3 fL (ref 80.0–100.0)
MPV: 9.6 fL (ref 7.5–12.5)
Monocytes Relative: 8.4 %
NEUTROS ABS: 3380 {cells}/uL (ref 1500–7800)
Neutrophils Relative %: 49.7 %
Platelets: 219 10*3/uL (ref 140–400)
RBC: 5.06 10*6/uL (ref 4.20–5.80)
RDW: 13.5 % (ref 11.0–15.0)
Total Lymphocyte: 38.6 %
WBC: 6.8 10*3/uL (ref 3.8–10.8)
WBCMIX: 571 {cells}/uL (ref 200–950)

## 2017-01-15 LAB — COMPLETE METABOLIC PANEL WITH GFR
AG Ratio: 1.7 (calc) (ref 1.0–2.5)
ALBUMIN MSPROF: 4.5 g/dL (ref 3.6–5.1)
ALT: 120 U/L — ABNORMAL HIGH (ref 9–46)
AST: 67 U/L — ABNORMAL HIGH (ref 10–40)
Alkaline phosphatase (APISO): 79 U/L (ref 40–115)
BUN: 10 mg/dL (ref 7–25)
CALCIUM: 9.8 mg/dL (ref 8.6–10.3)
CO2: 28 mmol/L (ref 20–32)
CREATININE: 0.99 mg/dL (ref 0.60–1.35)
Chloride: 103 mmol/L (ref 98–110)
GFR, EST AFRICAN AMERICAN: 114 mL/min/{1.73_m2} (ref >=60)
GFR, EST NON AFRICAN AMERICAN: 98 mL/min/{1.73_m2} (ref >=60)
GLOBULIN: 2.7 g/dL (ref 1.9–3.7)
Glucose, Bld: 108 mg/dL — ABNORMAL HIGH (ref 65–99)
Potassium: 5.1 mmol/L (ref 3.5–5.3)
SODIUM: 139 mmol/L (ref 135–146)
TOTAL PROTEIN: 7.2 g/dL (ref 6.1–8.1)
Total Bilirubin: 0.9 mg/dL (ref 0.2–1.2)

## 2017-01-16 NOTE — Progress Notes (Signed)
Patient follow-up with PCP or GI regarding elevated LFTs

## 2017-01-21 ENCOUNTER — Telehealth: Payer: Self-pay | Admitting: Radiology

## 2017-01-21 NOTE — Telephone Encounter (Signed)
I have called patient to advise.  

## 2017-01-21 NOTE — Telephone Encounter (Signed)
-----   Message from Pollyann Savoy, MD sent at 01/16/2017  1:28 PM EDT ----- Patient follow-up with PCP or GI regarding elevated LFTs

## 2017-01-27 ENCOUNTER — Ambulatory Visit (HOSPITAL_BASED_OUTPATIENT_CLINIC_OR_DEPARTMENT_OTHER): Payer: BLUE CROSS/BLUE SHIELD | Admitting: Physical Medicine & Rehabilitation

## 2017-01-27 ENCOUNTER — Encounter: Payer: Self-pay | Admitting: Physical Medicine & Rehabilitation

## 2017-01-27 ENCOUNTER — Encounter: Payer: BLUE CROSS/BLUE SHIELD | Attending: Physical Medicine & Rehabilitation

## 2017-01-27 ENCOUNTER — Encounter (INDEPENDENT_AMBULATORY_CARE_PROVIDER_SITE_OTHER): Payer: Self-pay

## 2017-01-27 VITALS — BP 146/102 | HR 100

## 2017-01-27 DIAGNOSIS — L4050 Arthropathic psoriasis, unspecified: Secondary | ICD-10-CM

## 2017-01-27 DIAGNOSIS — M130 Polyarthritis, unspecified: Secondary | ICD-10-CM | POA: Insufficient documentation

## 2017-01-27 DIAGNOSIS — M461 Sacroiliitis, not elsewhere classified: Secondary | ICD-10-CM

## 2017-01-27 DIAGNOSIS — G894 Chronic pain syndrome: Secondary | ICD-10-CM

## 2017-01-27 DIAGNOSIS — M542 Cervicalgia: Secondary | ICD-10-CM | POA: Diagnosis not present

## 2017-01-27 MED ORDER — TRAMADOL HCL 50 MG PO TABS: 50.0000 mg | ORAL_TABLET | Freq: Two times a day (BID) | ORAL | 1 refills | Status: DC

## 2017-01-27 NOTE — Progress Notes (Signed)
Subjective:    Patient ID: Miguel Evans, male    DOB: March 03, 1981, 36 y.o.   MRN: [id number]  HPI CC Neck pain  Neck pain radiates to trap and scapular area.No numbness in hands, occasional tingling Neck pain onset in HS, playing football No PT except post op  DC treatments only help for 2 days Massage gives ~48hrs relief  Hands and knees are secondary complaints, has bilateral wrist pain at times as well as pain in base of thumb as well as MCPs #2 and 3. Bilateral  Hips hurt with prolonged standing.  Shoulder pain with overhead activity Back hurts with prolonged standing  On Cosentyx- prescribed by Rheumatology  Dr Corliss Skains  Hx of gout without recent flare  Has had Mood swings from prednisone  No hx of joint injections   No skin plaques except under beard  Patient has not been working since May 2017, rheumatology is writing long-term disability. Patient does housework, cooking, cleaning, dishes, laundry, standing tolerance 30-45 minutes. Patient drives.  Cymbalta just started Pain Inventory Average Pain 7 Pain Right Now 9 My pain is sharp, stabbing and aching  In the last 24 hours, has pain interfered with the following? General activity 8 Relation with others 8 Enjoyment of life 10 What TIME of day is your pain at its worst? morning Sleep (in general) Poor  Pain is worse with: walking, inactivity, standing and some activites Pain improves with: rest Relief from Meds: 1  Mobility walk without assistance walk with assistance use a cane ability to climb steps?  yes do you drive?  yes  Function not employed: date last employed may 2017 I need assistance with the following:  dressing  Neuro/Psych weakness numbness tingling trouble walking spasms depression  Prior Studies Any changes since last visit?  no  Physicians involved in your care Any changes since last visit?  no   Family History  Problem Relation Age of Onset  . Cancer Mother   .  Bipolar disorder Mother   . Hypertension Father   . Osteoarthritis Father   . Emphysema Maternal Grandfather   . CAD Paternal Grandfather    Social History   Social History  . Marital status: Unknown    Spouse name: N/A  . Number of children: N/A  . Years of education: N/A   Social History Main Topics  . Smoking status: Former Smoker    Packs/day: 0.25    Years: 5.00    Types: Cigarettes    Quit date: 03/27/2012  . Smokeless tobacco: Former Neurosurgeon  . Alcohol use 1.2 oz/week    2 Cans of beer per week  . Drug use: No  . Sexual activity: Yes    Birth control/ protection: Condom   Other Topics Concern  . None   Social History Narrative  . None   Past Surgical History:  Procedure Laterality Date  . ANTERIOR CRUCIATE LIGAMENT REPAIR Bilateral Left in 2002, Right 2013  . VASECTOMY     Past Medical History:  Diagnosis Date  . Anxiety   . Gout   . Hypertension    BP (!) 146/102   Pulse 100   SpO2 98%   Opioid Risk Score:   Fall Risk Score:  `1  Depression screen PHQ 2/9  No flowsheet data found.   Review of Systems  Constitutional: Positive for unexpected weight change.  HENT: Negative.   Eyes: Negative.   Respiratory: Positive for cough.   Cardiovascular: Negative.   Gastrointestinal: Negative.  Endocrine: Negative.   Genitourinary: Negative.   Musculoskeletal: Positive for joint swelling.  Skin: Negative.   Allergic/Immunologic: Negative.   Neurological: Negative.   Hematological: Negative.   Psychiatric/Behavioral: Negative.   All other systems reviewed and are negative.      Objective:   Physical Exam  Constitutional: He is oriented to person, place, and time. He appears well-developed.  Class III. Obesity  HENT:  Head: Normocephalic and atraumatic.  Eyes: Pupils are equal, round, and reactive to light. Conjunctivae and EOM are normal.  Neck: No JVD present.  Cardiovascular: Normal rate, regular rhythm and normal heart sounds.  Exam  reveals no friction rub.   No murmur heard. Pulmonary/Chest: Effort normal and breath sounds normal. No stridor. No respiratory distress. He has no wheezes.  Abdominal: Soft. Bowel sounds are normal. He exhibits no distension. There is no tenderness.  Musculoskeletal:       Right knee: He exhibits decreased range of motion. He exhibits no effusion and no deformity. Tenderness found. Medial joint line tenderness noted.       Left knee: He exhibits normal range of motion, no effusion and no deformity. Tenderness found. Medial joint line tenderness noted.       Right ankle: He exhibits no swelling, no ecchymosis and no deformity. Achilles tendon exhibits pain. Achilles tendon exhibits no defect.  Lymphadenopathy:    He has no cervical adenopathy.  Neurological: He is alert and oriented to person, place, and time. He displays no tremor. No sensory deficit. Coordination and gait normal.  Reflex Scores:      Tricep reflexes are 2+ on the right side and 2+ on the left side.      Bicep reflexes are 2+ on the right side and 2+ on the left side.      Brachioradialis reflexes are 2+ on the right side and 2+ on the left side.      Patellar reflexes are 2+ on the right side and 2+ on the left side.      Achilles reflexes are 2+ on the right side and 2+ on the left side. Motor strength is 5/5 bilateral deltoids, biceps, triceps, grip, hip flexor, knee extensor, ankle dorsiflexor and plantar flexor.  Patient has no evidence of toe drag or knee instability. He complains of pain and groans when he gets up and down from the exam table.  Sensation is intact to pin prick bilateral C5, C6-C7, C8, L2, L3, L4, L5, S1 dermatomal distribution   Psychiatric: He has a normal mood and affect. His behavior is normal. Judgment and thought content normal.  Nursing note and vitals reviewed.   No evidence of joint swelling in the hand and wrist area  Full range of motion in the hand and wrist area full range of motion  in the elbows. Mildly reduced at end range, internal, external rotation at the shoulders, full abduction, full forward flexion.  Cervical range of motion 75%. Rotation to the right and rotation to the left Cervical extension 75% flexion 100%, lateral bending is 50% to the right and 50% to the left  Tenderness over the C7 spinous processes. Tenderness over the upper trapezius area      Assessment & Plan:   1. Chronic cervical pain, patient gives history of remote injury during football in high school, I have ordered a cervical spine x-ray that we can review at next visit. I would suspect that he would have some degenerative changes. He does not have any evidence of cervical radiculopathy  based on history or examination. In addition, patient has evidence of myofascial pain syndrome affecting the trapezius cells bilaterally We discussed that he may use modalities such as ice for his axial pain   2. Polyarthralgia without evidence of joint deformity or joint effusion. He does have a history of psoriatic arthritis, he may have some intact to light [place]. However, given his body habitus, this is difficult to determine. He has few remaining skin areas and appears to be well controlled with his TNF inhibitor therapy  He does have residual pain and stiffness, which may occur and for that reason, we will prescribe tramadol 50 mg 3 times a day His opioid risk is 6, moderate Patient admits to prior use of marijuana  3. Low back pain, most likely sacroiliac disorder, this is not severe. If this worsens, may consider sacroiliac injection under fluoroscopic guidance  . 4. Diffuse muscular soreness, he does not have tender areas above and below the waist, although underlying fibromyalgia is suspected. Has just started Cymbalta will evaluate response, may need to increase to 60 mg would not go any higher given concomitant tramadol  5. Chronic pain syndrome encompassing #1 through 4 may benefit from physical  therapy at integrative therapies, may incorporate massage as well as acupuncture for symptom management, but truly focusing on postural issues, muscle strengthening and range of motion.  Physical medicine rehabilitation follow-up in one month

## 2017-01-27 NOTE — Patient Instructions (Signed)
He should work work best for your trapezius pain as well as low back pain I should be further joint pain, such as in your hands and knees and ankle and perhaps the neck as well  We'll make referral to integrative therapies  We may consider psychology to reduce tension associated with movement, relaxation

## 2017-01-29 ENCOUNTER — Telehealth: Payer: Self-pay

## 2017-01-29 NOTE — Telephone Encounter (Signed)
[facility] (did not specify) called stating that the patients refill on tramadol needs a prior authorization.

## 2017-01-29 NOTE — Telephone Encounter (Signed)
Prior authorization started

## 2017-01-30 ENCOUNTER — Telehealth: Payer: Self-pay | Admitting: *Deleted

## 2017-01-30 NOTE — Telephone Encounter (Signed)
Caller stated tramadol needs prior auth, no call yet.

## 2017-02-02 ENCOUNTER — Telehealth: Payer: Self-pay | Admitting: Physical Medicine & Rehabilitation

## 2017-02-02 NOTE — Telephone Encounter (Signed)
Called and left message for [facility] to call my extension.

## 2017-02-02 NOTE — Telephone Encounter (Signed)
Miguel Evans with [employer] called and states she has a question about tramadol needing prior auth and no one is return call??  [phone] ext [phone]

## 2017-02-03 NOTE — Telephone Encounter (Signed)
[facility] called back with # for prior auth dept [phone].  Prior auth initiated by phone and they have a 15 day turnaround time!  I have asked for expedited review.  Review # is [id number]

## 2017-02-10 NOTE — Telephone Encounter (Addendum)
Tramadol was denied by [employer]. I notified MR Randie HeinzCain about this and that he can use [facility] and get the #60 tramadol for discounted price with coupons paying cash,

## 2017-02-10 NOTE — Telephone Encounter (Signed)
Appe[facility] with [facility] notes.

## 2017-02-19 ENCOUNTER — Ambulatory Visit
Admission: RE | Admit: 2017-02-19 | Discharge: 2017-02-19 | Disposition: A | Discharge status: Home / Self Care | Payer: BLUE CROSS/BLUE SHIELD | Source: Ambulatory Visit | Attending: Physical Medicine & Rehabilitation | Admitting: Physical Medicine & Rehabilitation

## 2017-02-19 DIAGNOSIS — M542 Cervicalgia: Secondary | ICD-10-CM

## 2017-02-24 ENCOUNTER — Ambulatory Visit: Payer: BLUE CROSS/BLUE SHIELD | Admitting: Physical Medicine & Rehabilitation

## 2017-02-24 ENCOUNTER — Encounter: Payer: Self-pay | Admitting: Physical Medicine & Rehabilitation

## 2017-02-24 ENCOUNTER — Other Ambulatory Visit: Payer: Self-pay

## 2017-02-24 ENCOUNTER — Encounter: Payer: BLUE CROSS/BLUE SHIELD | Attending: Physical Medicine & Rehabilitation

## 2017-02-24 VITALS — BP 137/91 | HR 97 | Resp 14

## 2017-02-24 DIAGNOSIS — M130 Polyarthritis, unspecified: Secondary | ICD-10-CM | POA: Insufficient documentation

## 2017-02-24 DIAGNOSIS — M222X2 Patellofemoral disorders, left knee: Secondary | ICD-10-CM

## 2017-02-24 DIAGNOSIS — M222X1 Patellofemoral disorders, right knee: Secondary | ICD-10-CM | POA: Diagnosis not present

## 2017-02-24 DIAGNOSIS — M542 Cervicalgia: Secondary | ICD-10-CM

## 2017-02-24 MED ORDER — TRAMADOL HCL 50 MG PO TABS: 50.0000 mg | ORAL_TABLET | Freq: Two times a day (BID) | ORAL | 5 refills | Status: DC

## 2017-02-24 NOTE — Patient Instructions (Signed)
Ask your PT about acupuncture for knees and neck

## 2017-02-24 NOTE — Progress Notes (Signed)
Subjective:    Patient ID: Miguel Evans, male    DOB: 26-Mar-1981, 36 y.o.   MRN: [id number]  HPI 36 year old male with history of psoriatic arthritis and chronic neck pain. He has undergone x-ray of the cervical spine last month. Report reads mild degenerative changes C6-C7 mild disc space narrowing.  We looked at the actual films.  No evidence of spondylolisthesis.  There is anterior osteophyte at the inferior border of C6.  There is decreased disc space at C6-C7. This corresponds with where some of his cervical pain is.  In addition he continues to come plane of pain along the trapezius area. He has followed through with physical therapy. After preapproval process completed, tramadol has been approved 50 mg twice daily.  This is given him some relief without side effects. Patient has had knee pain since high school.  Has been told that he has meniscal tears and at some point will likely undergo total knee replacements.  Patient is looking for options to help with knee pain and prevent need for total knee replacement.  He has been trying to lose weight. Pain Inventory Average Pain 6 Pain Right Now 8 My pain is sharp, burning, stabbing and aching  In the last 24 hours, has pain interfered with the following? General activity 6 Relation with others 6 Enjoyment of life 6 What TIME of day is your pain at its worst? morning, night Sleep (in general) Fair  Pain is worse with: walking, bending, sitting, inactivity, standing and some activites Pain improves with: heat/ice and medication Relief from Meds: 5  Mobility walk without assistance how many minutes can you walk? 30 ability to climb steps?  yes do you drive?  yes  Function disabled: date disabled . I need assistance with the following:  dressing, household duties and shopping  Neuro/Psych numbness trouble walking spasms  Prior Studies Any changes since last visit?  no  Physicians involved in your care Any changes since  last visit?  no   Family History  Problem Relation Age of Onset  . Cancer Mother   . Bipolar disorder Mother   . Hypertension Father   . Osteoarthritis Father   . Emphysema Maternal Grandfather   . CAD Paternal Grandfather    Social History   Socioeconomic History  . Marital status: Unknown    Spouse name: None  . Number of children: None  . Years of education: None  . Highest education level: None  Social Needs  . Financial resource strain: None  . Food insecurity - worry: None  . Food insecurity - inability: None  . Transportation needs - medical: None  . Transportation needs - non-medical: None  Occupational History  . None  Tobacco Use  . Smoking status: Former Smoker    Packs/day: 0.25    Years: 5.00    Pack years: 1.25    Types: Cigarettes    Last attempt to quit: 03/27/2012    Years since quitting: 4.9  . Smokeless tobacco: Former Engineer, waterUser  Substance and Sexual Activity  . Alcohol use: Yes    Alcohol/week: 1.2 oz    Types: 2 Cans of beer per week  . Drug use: No  . Sexual activity: Yes    Birth control/protection: Condom  Other Topics Concern  . None  Social History Narrative  . None   Past Surgical History:  Procedure Laterality Date  . ANTERIOR CRUCIATE LIGAMENT REPAIR Bilateral Left in 2002, Right 2013  . VASECTOMY     Past  Medical History:  Diagnosis Date  . Anxiety   . Gout   . Hypertension    BP (!) 137/91 (BP Location: Left Arm, Patient Position: Sitting, Cuff Size: Large)   Pulse 97   Resp 14   SpO2 98%   Opioid Risk Score:   Fall Risk Score:  `1  Depression screen PHQ 2/9  Depression screen PHQ 2/9 01/27/2017  Decreased Interest 1  Down, Depressed, Hopeless 1  PHQ - 2 Score 2  Altered sleeping 2  Tired, decreased energy 2  Change in appetite 2  Feeling bad or failure about yourself  2  Trouble concentrating 0  Moving slowly or fidgety/restless 0  Suicidal thoughts 0  PHQ-9 Score 10  Difficult doing work/chores Somewhat  difficult    Review of Systems  Constitutional: Positive for appetite change and unexpected weight change.  HENT: Negative.   Respiratory: Negative.   Gastrointestinal: Negative.   Genitourinary: Negative.   Musculoskeletal: Positive for arthralgias, back pain, gait problem, neck pain and neck stiffness.       Spasms  Neurological: Positive for numbness.  Hematological: Negative.   Psychiatric/Behavioral: Negative.        Objective:   Physical Exam  Constitutional: He is oriented to person, place, and time. He appears well-developed and well-nourished.  HENT:  Head: Normocephalic and atraumatic.  Eyes: Conjunctivae and EOM are normal. Pupils are equal, round, and reactive to light.  Neck: Normal range of motion. Neck supple. No tracheal deviation present. No thyromegaly present.  Cervical spine with reduced range of motion Right rotation is 75% left rotation 50% flexion extension 50-75%   Neurological: He is alert and oriented to person, place, and time.  Motor strength is 5/5 bilateral deltoid, bicep, tricep, grip He has good range of motion at the shoulders. Bilateral knees have no evidence of effusion.  There is crepitus bilateral patellar area. Good range of motion at the knees with flexion and extension.   Psychiatric: He has a normal mood and affect.  Nursing note and vitals reviewed.         Assessment & Plan:  #1.  Cervicalgia he does have cervical spondylosis with cervical degenerative disc primarily C6-C7 as well as secondary myofascial pain syndrome.  We discussed that range of motion will be helpful but that he has to be careful not to overdo his range of motion exercise. We discussed that acupuncture may be a good treatment option.  If he has a severe flareup he may benefit from trigger point injections. 2.  Bilateral knee pain history of meniscal tear.  He does have crepitus in the patellar area.  We discussed that his knee exercises should be between 90 and  30 degrees of knee flexion.  Air squats should be between 0 and 30 degrees of knee flexion  3.  Chronic pain syndrome polyarthralgia as well as cervicalgia.  This is multifactorial, has evidence of psoriatic arthritis as well as osteoarthritis of the cervical spine and probable degenerative meniscal tears bilaterally as well as patellofemoral syndrome. Continue tramadol 50 milligrams twice daily, 60 tablets with 5 refills. Return to clinic in 6 months, sooner if he has a flareup Over half of the 25 min visit was spent counseling and coordinating care.

## 2017-03-03 ENCOUNTER — Other Ambulatory Visit: Payer: Self-pay | Admitting: *Deleted

## 2017-03-03 MED ORDER — SECUKINUMAB 150 MG/ML ~~LOC~~ SOAJ: 300.0000 mg | SUBCUTANEOUS | 0 refills | Status: DC

## 2017-03-03 NOTE — Telephone Encounter (Signed)
Refill request received via fax  Last Visit: 12/25/16 Next Visit: 03/27/17 Labs: 01/25/17 Elevated LFTs TB Gold: 08/15/16 Neg  Okay to refill per Dr. Corliss Skainseveshwar

## 2017-03-12 ENCOUNTER — Telehealth: Payer: Self-pay | Admitting: Rheumatology

## 2017-03-12 NOTE — Telephone Encounter (Signed)
Patient would like to have his handicap placard renewed. Psoriatic arthritis, Chronic left SI joint pain, Idiopathic chronic gout without tophus, unspecified site, History of neck pain  Okay to renew handicap placard?

## 2017-03-12 NOTE — Telephone Encounter (Signed)
Patient would like to have his handicap placard renewed. Current one runs out in two days. Please call to advise.

## 2017-03-12 NOTE — Telephone Encounter (Signed)
Okay to give temporary handicap placard.

## 2017-03-13 NOTE — Telephone Encounter (Signed)
Patient advised we will be able to renew his handicap placard for him and he can pick it up on Monday.

## 2017-03-16 NOTE — Progress Notes (Signed)
Office Visit Note  Patient: Miguel Evans             Date of Birth: [birth year]           MRN: [id number]             PCP: Nonnie Done., MD Referring: Nonnie Done., MD Visit Date: 03/27/2017 Occupation: @GUAROCC @    Subjective:  Pain in neck, hips and knees.   History of Present Illness: Miguel Evans is a 36 y.o. male with history of psoriatic arthritis psoriasis and osteoarthritis. He states his psoriatic arthritis is better controls since he is been taking Cosyntex every other week instead of once a month. He still continues to have some discomfort in his C-spine. He says his doctor did x-ray of his C-spine and diagnosed her with this disease of the cervical spine. He's been also having discomfort in his bilateral hips and knee joints. He denies any joint swelling. He states his psoriasis is well controlled. He was also seen by Dr. Elnoria Howard for elevated LFTs. According to patient his repeat test showed improvement in his LFTs. He has not had any gout flare. He states she's been taking colchicine once a day. If he does not take colchicine on daily basis he feels little twinges.  Activities of Daily Living:  Patient reports morning stiffness for 1 hour.   Patient Denies nocturnal pain.  Difficulty dressing/grooming: Denies Difficulty climbing stairs: Reports Difficulty getting out of chair: Reports Difficulty using hands for taps, buttons, cutlery, and/or writing: Denies   Review of Systems  Constitutional: Negative for fatigue, night sweats and weakness ( ).  HENT: Negative for mouth sores, mouth dryness and nose dryness.   Eyes: Negative for redness and dryness.  Respiratory: Negative for shortness of breath and difficulty breathing.   Cardiovascular: Positive for hypertension. Negative for chest pain, palpitations, irregular heartbeat and swelling in legs/feet.  Gastrointestinal: Negative for constipation and diarrhea.  Endocrine: Negative for increased urination.    Musculoskeletal: Positive for arthralgias, joint pain and morning stiffness. Negative for joint swelling, myalgias, muscle weakness, muscle tenderness and myalgias.  Skin: Positive for rash. Negative for color change, hair loss, nodules/bumps, skin tightness, ulcers and sensitivity to sunlight.  Allergic/Immunologic: Negative for susceptible to infections.  Neurological: Negative for dizziness, fainting, memory loss and night sweats.  Hematological: Negative for swollen glands.  Psychiatric/Behavioral: Positive for depressed mood. Negative for sleep disturbance. The patient is not nervous/anxious.     PMFS History:  Patient Active Problem List   Diagnosis Date Noted  . Psoriasis 03/27/2017  . 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 Patellofemoral arthralgia of both knees 02/24/2017  . Hyperuricemia 03/26/2016  . Elevated blood pressure  03/26/2016  . Elevated LFTs 03/26/2016  . Anxiety 03/26/2016  . Psoriatic arthritis (HCC) 03/25/2016  . High risk medication use 03/25/2016  . Cervicalgia 03/25/2016  . Hand pain 03/25/2016  . 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 Anxiety   . Gout   . Hypertension     Family History  Problem Relation Age of Onset  . Cancer Mother        breast   . Bipolar disorder Mother   . Hypertension Father   . Osteoarthritis Father   . Emphysema Maternal Grandfather   . CAD Paternal Grandfather   . Healthy Son   . Healthy Son   . Healthy Daughter   . Healthy Daughter    Past Surgical History:  Procedure Laterality Date  . ANTERIOR CRUCIATE LIGAMENT REPAIR Bilateral Left in 2002, Right 2013  . VASECTOMY     Social History   Social History Narrative  . Not on file     Objective: Vital Signs: BP (!) 140/98 (BP Location: Left Arm, Patient Position: Sitting, Cuff Size: Normal)   Pulse 91   Resp 17   Ht 6\' 3"  (1.905 m)   Wt (!) 304 lb (137.9 kg)   BMI 38.00 kg/m    Physical Exam  Constitutional: He is  oriented to person, place, and time. He appears well-developed and well-nourished.  HENT:  Head: Normocephalic and atraumatic.  Eyes: Conjunctivae and EOM are normal. Pupils are equal, round, and reactive to light.  Neck: Normal range of motion. Neck supple.  Cardiovascular: Normal rate, regular rhythm and normal heart sounds.  Pulmonary/Chest: Effort normal and breath sounds normal.  Abdominal: Soft. Bowel sounds are normal.  Neurological: He is alert and oriented to person, place, and time.  Skin: Skin is warm and dry. Capillary refill takes less than 2 seconds. Rash noted.  Psoriasis lesion on abdomen  Psychiatric: He has a normal mood and affect. His behavior is normal.  Nursing note and vitals reviewed.    Musculoskeletal Exam: He has limited range of motion with discomfort. Thoracic and lumbar spine good range of motion. Shoulder joints elbow joints wrist joint MCPs PIPs DIPs with good range of motion with no synovitis. He has some discomfort range of motion of his right hip joint. He tenderness over bilateral trochanteric bursa. He complains of discomfort in his bilateral knee joints and tenderness no warmth swelling or effusion was noted. Ankle joints MTPs PIPs were all good range of motion with no synovitis.  CDAI Exam: CDAI Homunculus Exam:   Tenderness:  RLE: tibiofemoral LLE: tibiofemoral  Joint Counts:  CDAI Tender Joint count: 2 CDAI Swollen Joint count: 0  Global Assessments:  Patient Global Assessment: 6 Provider Global Assessment: 3  CDAI Calculated Score: 11    Investigation: No additional findings.TB Gold: 08/15/2016 Negative  CBC Latest Ref Rng & Units 01/15/2017 10/24/2016 09/12/2016  WBC 3.8 - 10.8 Thousand/uL 6.8 6.2 7.9  Hemoglobin 13.2 - 17.1 g/dL 16.115.6 09.615.9 04.515.2  Hematocrit 38.5 - 50.0 % 45.7 47.1 45.9  Platelets 140 - 400 Thousand/uL 219 227 208   CMP Latest Ref Rng & Units 01/15/2017 10/24/2016 09/12/2016  Glucose 65 - 99 mg/dL 409(W108(H) 98 119(J103(H)  BUN 7 -  25 mg/dL 10 12 21   Creatinine 0.60 - 1.35 mg/dL 4.780.99 2.951.06 6.21(H1.38(H)  Sodium 135 - 146 mmol/L 139 136 138  Potassium 3.5 - 5.3 mmol/L 5.1 4.8 4.5  Chloride 98 - 110 mmol/L 103 103 103  CO2 20 - 32 mmol/L 28 23 24   Calcium 8.6 - 10.3 mg/dL 9.8 9.6 9.5  Total Protein 6.1 - 8.1 g/dL 7.2 7.2 7.0  Total Bilirubin 0.2 - 1.2 mg/dL 0.9 0.9 0.8  Alkaline Phos 40 - 115 U/L - 74 72  AST 10 - 40 U/L 67(H) 66(H) 50(H)  ALT 9 - 46 U/L 120(H) 114(H) 88(H)    Imaging: Xr Hips Bilat W Or W/o Pelvis 3-4 Views  Result Date: 03/27/2017 Right hip joints revealed mild inferior medial hip joint narrowing no chondrocalcinosis osteophytes were noted. Left hip joint did not reveal any joint space narrowing. No chondrocalcinosis was noted. Impression: mild osteoarthritis of right hip joint and normal joint space of the left hip joint.  Xr Knee 3 View Left  Result  Date: 03/27/2017 Moderate medial compartment narrowing was noted. No chondrocalcinosis was noted. Postsurgical changes with screws and pins were noted. Moderate patellofemoral narrowing was noted. Impression: These studies are consistent with moderate osteoarthritis and moderate pressure patella.  Xr Knee 3 View Right  Result Date: 03/27/2017 Moderate medial compartment narrowing with medial osteophytes no chondrocalcinosis was needed. Moderate patellofemoral narrowing was noted. 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   Speciality Comments: No specialty comments available.    Procedures:  No procedures performed Allergies: Patient has no known allergies.   Assessment / Plan:     Visit Diagnoses: Psoriatic arthritis (HCC) - failed Enbrel, failed Humira, had to stop MTX due to increased LFTs, can't use prednisone due to mood swings. history of dactylitis. He has no active synovitis on examination. I believe his symptoms are much better controlled now. He continues  to have some arthralgias. She's been complaining of neck pain, bilateral hip pain and bilateral knee pain. No evidence of dactylitis, Achillis tendinitis of plantar fasciitis was noted.  Psoriasis: He is a small patch on his abdominal region. His psoriasis is mostly clear.  High risk medication use - Cosentyx 150 mg subcutaneous every other week. His labs are stable except for elevated LFTs.  Bilateral hip pain - Plan: XR HIP UNILAT W OR W/O PELVIS 2-3 VIEWS LEFT, XR HIP UNILAT W OR W/O PELVIS 2-3 VIEWS RIGHT, XR HIPS BILAT W OR W/O PELVIS 3-4 VIEWS. X-rays revealed right hip joint with mild osteoarthritis and unremarkable x-ray of the left hip joint.  Bilateral trochanteric bursitis: ITB and exercise were demonstrated and handout was given.  Pain in both knees, unspecified chronicity - Plan: XR KNEE 3 VIEW RIGHT, XR KNEE 3 VIEW LEFT. He has moderate osteoarthritis in bilateral knee joints and moderate chondromalacia patella with postsurgical changes. Weight loss diet and exercise was discussed at length.  Idiopathic chronic gout of multiple sites without tophus - allopurinol 300 mg by mouth daily, colchicine 0.6 mg by mouth daily. He denies any gout flare.  Chronic left SI joint pain: His SI joint is improved.   Elevated LFTs - referred to Dr. Elnoria HowardHung to eval and tx. Per patient Dr. Elnoria HowardHung diagnosed her with fatty liver and advised him weight loss. He is following Dr. Elnoria HowardHung now.  DDD (degenerative disc disease), cervical: Per patient and do not have x-rays to review. He states he went to physical therapy and it was to expensive. He's been doing some exercises at home.  Fibromyalgia - Cymbalta 30 mg by mouth twice a day prescribed by his PCP. He's noticed improvement in his generalized pain and also depression.  History of hypertension : His blood pressure is a still elevated. I've advised him to follow up with PCP.  He discussed his disability situation today. I advised him to return back to work.  He states that he will have to look for work he is currently unemployed.   Orders: Orders Placed This Encounter  Procedures  . XR KNEE 3 VIEW RIGHT  . XR KNEE 3 VIEW LEFT  . XR HIPS BILAT W OR W/O PELVIS 3-4 VIEWS  . Uric acid   No orders of the defined types were placed in this encounter.   Face-to-face time spent with patient was 30 minutes. Greater than 50% of time was spent in counseling and coordination of care.  Follow-Up Instructions: Return in about 5 months (around 08/25/2017) for Psoriatic arthritis, psoriasis, gout.  Bo Merino, MD  Note - This record has been created using Editor, commissioning.  Chart creation errors have been sought, but may not always  have been located. Such creation errors do not reflect on  the standard of medical care.

## 2017-03-27 ENCOUNTER — Ambulatory Visit (INDEPENDENT_AMBULATORY_CARE_PROVIDER_SITE_OTHER): Payer: Self-pay

## 2017-03-27 ENCOUNTER — Encounter: Payer: Self-pay | Admitting: Rheumatology

## 2017-03-27 ENCOUNTER — Ambulatory Visit: Payer: BLUE CROSS/BLUE SHIELD | Admitting: Rheumatology

## 2017-03-27 VITALS — BP 140/98 | HR 91 | Resp 17 | Ht 75.0 in | Wt 304.0 lb

## 2017-03-27 DIAGNOSIS — E790 Hyperuricemia without signs of inflammatory arthritis and tophaceous disease: Secondary | ICD-10-CM | POA: Diagnosis not present

## 2017-03-27 DIAGNOSIS — M25561 Pain in right knee: Secondary | ICD-10-CM

## 2017-03-27 DIAGNOSIS — Z8679 Personal history of other diseases of the circulatory system: Secondary | ICD-10-CM | POA: Diagnosis not present

## 2017-03-27 DIAGNOSIS — G8929 Other chronic pain: Secondary | ICD-10-CM

## 2017-03-27 DIAGNOSIS — M7062 Trochanteric bursitis, left hip: Secondary | ICD-10-CM | POA: Diagnosis not present

## 2017-03-27 DIAGNOSIS — R7989 Other specified abnormal findings of blood chemistry: Secondary | ICD-10-CM

## 2017-03-27 DIAGNOSIS — M533 Sacrococcygeal disorders, not elsewhere classified: Secondary | ICD-10-CM

## 2017-03-27 DIAGNOSIS — M25552 Pain in left hip: Secondary | ICD-10-CM

## 2017-03-27 DIAGNOSIS — M5030 Other cervical disc degeneration, unspecified cervical region: Secondary | ICD-10-CM

## 2017-03-27 DIAGNOSIS — Z79899 Other long term (current) drug therapy: Secondary | ICD-10-CM | POA: Diagnosis not present

## 2017-03-27 DIAGNOSIS — L4050 Arthropathic psoriasis, unspecified: Secondary | ICD-10-CM | POA: Diagnosis not present

## 2017-03-27 DIAGNOSIS — M25551 Pain in right hip: Secondary | ICD-10-CM | POA: Diagnosis not present

## 2017-03-27 DIAGNOSIS — M1A09X0 Idiopathic chronic gout, multiple sites, without tophus (tophi): Secondary | ICD-10-CM | POA: Diagnosis not present

## 2017-03-27 DIAGNOSIS — M25562 Pain in left knee: Secondary | ICD-10-CM | POA: Diagnosis not present

## 2017-03-27 DIAGNOSIS — M7061 Trochanteric bursitis, right hip: Secondary | ICD-10-CM

## 2017-03-27 DIAGNOSIS — L409 Psoriasis, unspecified: Secondary | ICD-10-CM | POA: Diagnosis not present

## 2017-03-27 DIAGNOSIS — R945 Abnormal results of liver function studies: Secondary | ICD-10-CM

## 2017-03-27 DIAGNOSIS — M797 Fibromyalgia: Secondary | ICD-10-CM

## 2017-03-27 NOTE — Patient Instructions (Addendum)
Standing Labs We placed an order today for your standing lab work.    Please come back and get your standing labs in January and every 3 months Uric acid should be drawn in January  We have open lab Monday through Friday from 8:30-11:30 AM and 1:30-4 PM at the office of Dr. Pollyann Savoy.   The office is located at [address], [address], [place], [place] [address] No appointment is necessary.   Labs are drawn by [facility].  You may receive a bill from [facility] for your lab work. If you have any questions regarding directions or hours of operation,  please call [phone].        Iliotibial Band Syndrome Rehab Ask your health care provider which exercises are safe for you. Do exercises exactly as told by your health care provider and adjust them as directed. It is normal to feel mild stretching, pulling, tightness, or discomfort as you do these exercises, but you should stop right away if you feel sudden pain or your pain gets worse.Do not begin these exercises until told by your health care provider. Stretching and range of motion exercises These exercises warm up your muscles and joints and improve the movement and flexibility of your hip and pelvis. Exercise A: Quadriceps, prone  1. Lie on your abdomen on a firm surface, such as a bed or padded floor. 2. Bend your left / right knee and hold your ankle. If you cannot reach your ankle or pant leg, loop a belt around your foot and grab the belt instead. 3. Gently pull your heel toward your buttocks. Your knee should not slide out to the side. You should feel a stretch in the front of your thigh and knee. 4. Hold this position for __________ seconds. Repeat __________ times. Complete this stretch __________ times a day. Exercise B: Iliotibial band  1. Lie on your side with your left / right leg in the top position. 2. Bend both of your knees and grab your left / right ankle. Stretch out your bottom arm to help you  balance. 3. Slowly bring your top knee back so your thigh goes behind your trunk. 4. Slowly lower your top leg toward the floor until you feel a gentle stretch on the outside of your left / right hip and thigh. If you do not feel a stretch and your knee will not fall farther, place the heel of your other foot on top of your knee and pull your knee down toward the floor with your foot. 5. Hold this position for __________ seconds. Repeat __________ times. Complete this stretch __________ times a day. Strengthening exercises These exercises build strength and endurance in your hip and pelvis. Endurance is the ability to use your muscles for a long time, even after they get tired. Exercise C: Straight leg raises ( hip abductors) 1. Lie on your side with your left / right leg in the top position. Lie so your head, shoulder, knee, and hip line up. You may bend your bottom knee to help you balance. 2. Roll your hips slightly forward so your hips are stacked directly over each other and your left / right knee is facing forward. 3. Tense the muscles in your outer thigh and lift your top leg 4-6 inches (10-15 cm). 4. Hold this position for __________ seconds. 5. Slowly return to the starting position. Let your muscles relax completely before doing another repetition. Repeat __________ times. Complete this exercise __________ times a day. Exercise D:  Straight leg raises ( hip extensors) 1. Lie on your abdomen on your bed or a firm surface. You can put a pillow under your hips if that is more comfortable. 2. Bend your left / right knee so your foot is straight up in the air. 3. Squeeze your buttock muscles and lift your left / right thigh off the bed. Do not let your back arch. 4. Tense this muscle as hard as you can without increasing any knee pain. 5. Hold this position for __________ seconds. 6. Slowly lower your leg to the starting position and allow it to relax completely. Repeat __________ times.  Complete this exercise __________ times a day. Exercise E: Hip hike 1. Stand sideways on a bottom step. Stand on your left / right leg with your other foot unsupported next to the step. You can hold onto the railing or wall if needed for balance. 2. Keep your knees straight and your torso square. Then, lift your left / right hip up toward the ceiling. 3. Slowly let your left / right hip lower toward the floor, past the starting position. Your foot should get closer to the floor. Do not lean or bend your knees. Repeat __________ times. Complete this exercise __________ times a day. This information is not intended to replace advice given to you by your health care provider. Make sure you discuss any questions you have with your health care provider. Document Released: 03/31/2005 Document Revised: 12/04/2015 Document Reviewed: 03/02/2015 Elsevier Interactive Patient Education  2018 [employer]  Knee Exercises Ask your health care provider which exercises are safe for you. Do exercises exactly as told by your health care provider and adjust them as directed. It is normal to feel mild stretching, pulling, tightness, or discomfort as you do these exercises, but you should stop right away if you feel sudden pain or your pain gets worse.Do not begin these exercises until told by your health care provider. STRETCHING AND RANGE OF MOTION EXERCISES These exercises warm up your muscles and joints and improve the movement and flexibility of your knee. These exercises also help to relieve pain, numbness, and tingling. Exercise A: Knee Extension, Prone 1. Lie on your abdomen on a bed. 2. Place your left / right knee just beyond the edge of the surface so your knee is not on the bed. You can put a towel under your left / right thigh just above your knee for comfort. 3. Relax your leg muscles and allow gravity to straighten your knee. You should feel a stretch behind your left / right knee. 4. Hold this  position for __________ seconds. 5. Scoot up so your knee is supported between repetitions. Repeat __________ times. Complete this stretch __________ times a day. Exercise B: Knee Flexion, Active  1. Lie on your back with both knees straight. If this causes back discomfort, bend your left / right knee so your foot is flat on the floor. 2. Slowly slide your left / right heel back toward your buttocks until you feel a gentle stretch in the front of your knee or thigh. 3. Hold this position for __________ seconds. 4. Slowly slide your left / right heel back to the starting position. Repeat __________ times. Complete this exercise __________ times a day. Exercise C: Quadriceps, Prone  1. Lie on your abdomen on a firm surface, such as a bed or padded floor. 2. Bend your left / right knee and hold your ankle. If you cannot reach your ankle or pant leg,  loop a belt around your foot and grab the belt instead. 3. Gently pull your heel toward your buttocks. Your knee should not slide out to the side. You should feel a stretch in the front of your thigh and knee. 4. Hold this position for __________ seconds. Repeat __________ times. Complete this stretch __________ times a day. Exercise D: Hamstring, Supine 1. Lie on your back. 2. Loop a belt or towel over the ball of your left / right foot. The ball of your foot is on the walking surface, right under your toes. 3. Straighten your left / right knee and slowly pull on the belt to raise your leg until you feel a gentle stretch behind your knee. ? Do not let your left / right knee bend while you do this. ? Keep your other leg flat on the floor. 4. Hold this position for __________ seconds. Repeat __________ times. Complete this stretch __________ times a day. STRENGTHENING EXERCISES These exercises build strength and endurance in your knee. Endurance is the ability to use your muscles for a long time, even after they get tired. Exercise E: Quadriceps,  Isometric  1. Lie on your back with your left / right leg extended and your other knee bent. Put a rolled towel or small pillow under your knee if told by your health care provider. 2. Slowly tense the muscles in the front of your left / right thigh. You should see your kneecap slide up toward your hip or see increased dimpling just above the knee. This motion will push the back of the knee toward the floor. 3. For __________ seconds, keep the muscle as tight as you can without increasing your pain. 4. Relax the muscles slowly and completely. Repeat __________ times. Complete this exercise __________ times a day. Exercise F: Straight Leg Raises - Quadriceps 1. Lie on your back with your left / right leg extended and your other knee bent. 2. Tense the muscles in the front of your left / right thigh. You should see your kneecap slide up or see increased dimpling just above the knee. Your thigh may even shake a bit. 3. Keep these muscles tight as you raise your leg 4-6 inches (10-15 cm) off the floor. Do not let your knee bend. 4. Hold this position for __________ seconds. 5. Keep these muscles tense as you lower your leg. 6. Relax your muscles slowly and completely after each repetition. Repeat __________ times. Complete this exercise __________ times a day. Exercise G: Hamstring, Isometric 1. Lie on your back on a firm surface. 2. Bend your left / right knee approximately __________ degrees. 3. Dig your left / right heel into the surface as if you are trying to pull it toward your buttocks. Tighten the muscles in the back of your thighs to dig as hard as you can without increasing any pain. 4. Hold this position for __________ seconds. 5. Release the tension gradually and allow your muscles to relax completely for __________ seconds after each repetition. Repeat __________ times. Complete this exercise __________ times a day. Exercise H: Hamstring Curls  If told by your health care provider,  do this exercise while wearing ankle weights. Begin with __________ weights. Then increase the weight by 1 lb (0.5 kg) increments. Do not wear ankle weights that are more than __________. 1. Lie on your abdomen with your legs straight. 2. Bend your left / right knee as far as you can without feeling pain. Keep your hips flat against the floor.  3. Hold this position for __________ seconds. 4. Slowly lower your leg to the starting position.  Repeat __________ times. Complete this exercise __________ times a day. Exercise I: Squats (Quadriceps) 1. Stand in front of a table, with your feet and knees pointing straight ahead. You may rest your hands on the table for balance but not for support. 2. Slowly bend your knees and lower your hips like you are going to sit in a chair. ? Keep your weight over your heels, not over your toes. ? Keep your lower legs upright so they are parallel with the table legs. ? Do not let your hips go lower than your knees. ? Do not bend lower than told by your health care provider. ? If your knee pain increases, do not bend as low. 3. Hold the squat position for __________ seconds. 4. Slowly push with your legs to return to standing. Do not use your hands to pull yourself to standing. Repeat __________ times. Complete this exercise __________ times a day. Exercise J: Wall Slides (Quadriceps)  1. Lean your back against a smooth wall or door while you walk your feet out 18-24 inches (46-61 cm) from it. 2. Place your feet hip-width apart. 3. Slowly slide down the wall or door until your knees bend __________ degrees. Keep your knees over your heels, not over your toes. Keep your knees in line with your hips. 4. Hold for __________ seconds. Repeat __________ times. Complete this exercise __________ times a day. Exercise K: Straight Leg Raises - Hip Abductors 1. Lie on your side with your left / right leg in the top position. Lie so your head, shoulder, knee, and hip line  up. You may bend your bottom knee to help you keep your balance. 2. Roll your hips slightly forward so your hips are stacked directly over each other and your left / right knee is facing forward. 3. Leading with your heel, lift your top leg 4-6 inches (10-15 cm). You should feel the muscles in your outer hip lifting. ? Do not let your foot drift forward. ? Do not let your knee roll toward the ceiling. 4. Hold this position for __________ seconds. 5. Slowly return your leg to the starting position. 6. Let your muscles relax completely after each repetition. Repeat __________ times. Complete this exercise __________ times a day. Exercise L: Straight Leg Raises - Hip Extensors 1. Lie on your abdomen on a firm surface. You can put a pillow under your hips if that is more comfortable. 2. Tense the muscles in your buttocks and lift your left / right leg about 4-6 inches (10-15 cm). Keep your knee straight as you lift your leg. 3. Hold this position for __________ seconds. 4. Slowly lower your leg to the starting position. 5. Let your leg relax completely after each repetition. Repeat __________ times. Complete this exercise __________ times a day. This information is not intended to replace advice given to you by your health care provider. Make sure you discuss any questions you have with your health care provider. Document Released: 02/12/2005 Document Revised: 12/24/2015 Document Reviewed: 02/04/2015 Elsevier Interactive Patient Education  2018 [employer]

## 2017-05-22 ENCOUNTER — Other Ambulatory Visit: Payer: Self-pay | Admitting: *Deleted

## 2017-05-22 MED ORDER — SECUKINUMAB 150 MG/ML ~~LOC~~ SOAJ: 150.0000 mg | SUBCUTANEOUS | 0 refills | Status: DC

## 2017-05-22 NOTE — Telephone Encounter (Signed)
Refill request received via fax   Last Visit: 03/27/17 Next Visit: 08/28/17 Labs: 01/15/17 labs are c/w previous fatty liver is the cause of the Elevated LFTs TB Gold: 08/15/16 Neg   Left message to advise patient he is due to update labs.  Okay to refill 30 day supply per Dr. Corliss Skainseveshwar.

## 2017-06-16 ENCOUNTER — Other Ambulatory Visit: Payer: Self-pay | Admitting: *Deleted

## 2017-06-16 DIAGNOSIS — E790 Hyperuricemia without signs of inflammatory arthritis and tophaceous disease: Secondary | ICD-10-CM

## 2017-06-16 DIAGNOSIS — Z79899 Other long term (current) drug therapy: Secondary | ICD-10-CM

## 2017-06-16 MED ORDER — SECUKINUMAB 150 MG/ML ~~LOC~~ SOAJ: 150.0000 mg | SUBCUTANEOUS | 0 refills | Status: DC

## 2017-06-16 NOTE — Telephone Encounter (Signed)
Refill request received via fax   Last Visit: 03/27/17 Next Visit: 08/28/17 Labs: 01/15/17 labs are c/w previous fatty liver is the cause of the Elevated LFTs TB Gold: 08/15/16 Neg   Spoke with patient and faxed orders to PCP's office he will update labs tomorrow.   Okay to refill 30 day supply per Dr. Corliss Skainseveshwar.

## 2017-06-17 ENCOUNTER — Other Ambulatory Visit: Payer: Self-pay | Admitting: *Deleted

## 2017-06-17 DIAGNOSIS — Z79899 Other long term (current) drug therapy: Secondary | ICD-10-CM

## 2017-06-17 LAB — COMPLETE METABOLIC PANEL WITH GFR
AG RATIO: 1.9 (calc) (ref 1.0–2.5)
ALBUMIN MSPROF: 5 g/dL (ref 3.6–5.1)
ALT: 36 U/L (ref 9–46)
AST: 25 U/L (ref 10–40)
Alkaline phosphatase (APISO): 78 U/L (ref 40–115)
BUN: 14 mg/dL (ref 7–25)
CALCIUM: 9.9 mg/dL (ref 8.6–10.3)
CO2: 27 mmol/L (ref 20–32)
CREATININE: 1.15 mg/dL (ref 0.60–1.35)
Chloride: 101 mmol/L (ref 98–110)
GFR, EST AFRICAN AMERICAN: 95 mL/min/{1.73_m2} (ref >=60)
GFR, EST NON AFRICAN AMERICAN: 82 mL/min/{1.73_m2} (ref >=60)
GLOBULIN: 2.7 g/dL (ref 1.9–3.7)
Glucose, Bld: 97 mg/dL (ref 65–99)
Potassium: 4.7 mmol/L (ref 3.5–5.3)
Sodium: 138 mmol/L (ref 135–146)
TOTAL PROTEIN: 7.7 g/dL (ref 6.1–8.1)
Total Bilirubin: 1.2 mg/dL (ref 0.2–1.2)

## 2017-06-17 LAB — CBC WITH DIFFERENTIAL/PLATELET
BASOS ABS: 33 {cells}/uL (ref 0–200)
BASOS PCT: 0.5 %
EOS ABS: 137 {cells}/uL (ref 15–500)
Eosinophils Relative: 2.1 %
HCT: 48.5 % (ref 38.5–50.0)
Hemoglobin: 16.8 g/dL (ref 13.2–17.1)
Lymphs Abs: 3016 cells/uL (ref 850–3900)
MCH: 31 pg (ref 27.0–33.0)
MCHC: 34.6 g/dL (ref 32.0–36.0)
MCV: 89.5 fL (ref 80.0–100.0)
MPV: 9.4 fL (ref 7.5–12.5)
Monocytes Relative: 7 %
Neutro Abs: 2860 cells/uL (ref 1500–7800)
Neutrophils Relative %: 44 %
PLATELETS: 251 10*3/uL (ref 140–400)
RBC: 5.42 10*6/uL (ref 4.20–5.80)
RDW: 12.6 % (ref 11.0–15.0)
Total Lymphocyte: 46.4 %
WBC: 6.5 10*3/uL (ref 3.8–10.8)
WBCMIX: 455 {cells}/uL (ref 200–950)

## 2017-06-18 NOTE — Progress Notes (Signed)
WNL

## 2017-07-10 ENCOUNTER — Telehealth: Payer: Self-pay | Admitting: Rheumatology

## 2017-07-10 NOTE — Telephone Encounter (Signed)
Patient called requesting prescription refills of Allpurinol and Colchicine.  Patient states his co-pay card also expired and needs a new one.  Patient states that he is completely out of both medications.

## 2017-07-13 MED ORDER — ALLOPURINOL 300 MG PO TABS: 300.0000 mg | ORAL_TABLET | Freq: Every day | ORAL | 1 refills | Status: DC

## 2017-07-13 MED ORDER — COLCHICINE 0.6 MG PO TABS: 0.6000 mg | ORAL_TABLET | Freq: Two times a day (BID) | ORAL | 3 refills | Status: DC

## 2017-07-13 MED ORDER — COLCHICINE 0.6 MG PO CAPS: 0.6000 mg | ORAL_CAPSULE | Freq: Two times a day (BID) | ORAL | 3 refills | Status: DC

## 2017-07-13 NOTE — Telephone Encounter (Signed)
Last Visit: 03/27/17 Next Visit: 08/28/17 Labs: 06/17/17 WNL  Okay to refill per Dr. Corliss Skainseveshwar   Patient will activate copay card online.

## 2017-07-30 ENCOUNTER — Other Ambulatory Visit: Payer: Self-pay | Admitting: *Deleted

## 2017-07-30 MED ORDER — SECUKINUMAB 150 MG/ML ~~LOC~~ SOAJ: 150.0000 mg | SUBCUTANEOUS | 0 refills | Status: DC

## 2017-07-30 NOTE — Telephone Encounter (Signed)
Refill request received via fax  Last Visit: 03/27/17 Next Visit: 08/28/17 Labs: 06/17/17 WNL TB Gold: 08/15/16 Neg   Okay to refill per Dr. Corliss Skainseveshwar

## 2017-08-14 NOTE — Progress Notes (Signed)
Office Visit Note  Patient: Miguel Evans             Date of Birth: 1980/09/30           MRN: [id number]             PCP: Nonnie Done., MD Referring: Nonnie Done., MD Visit Date: 08/28/2017 Occupation: @    Subjective:  Pain in neck, hands, and knees    History of Present Illness: Miguel Evans is a 37 y.o. male with history of psoriatic arthritis, gout, and DDD.  Patient has been injecting Cosentyx every other week.  He states he has noticed benefit since putting up his doses of Cosentyx.  He states that he will get increased joint stiffness leading up to his next dose about 3 days prior.  He states that he currently is having discomfort in his bilateral hands bilateral knees and neck.  He states that overall he feels a Cosentyx has been helping his joint pain and swelling now.  He reports he has been seeing a chiropractor 3 times a week for management of his neck pain.  He states that her range of motion is improved slightly.  He states chiropractor has helped with his hip pain and lower back pain though.  He states that he is having some swelling in his bilateral hands and bilateral feet.  He reports that he is having left SI joint pain and a few weeks ago had some right Achilles tenderness and tension.  He reports that he does have some psoriasis on his abdomen and periodically has patches on his face.  He states that after his Cosentyx injections his psoriasis usually resolves within 2 to 3 days. He denies any recent gout flares.  He continues take allopurinol 300 mg once daily and colchicine 0.6 mg 1 tablet daily. He reports that he has good days and bad days with his fibromyalgia.  He states in bodies he will have a significant muscle tenderness and muscle aches as generalized.  He states he continues take Cymbalta 30 mg twice daily which has been helping significantly with his muscle aches and joint pain.   Activities of Daily Living:  Patient reports morning stiffness for  30-45 minutes.   Patient Reports nocturnal pain.  Difficulty dressing/grooming: Reports Difficulty climbing stairs: Reports Difficulty getting out of chair: Reports Difficulty using hands for taps, buttons, cutlery, and/or writing: Reports   Review of Systems  Constitutional: Positive for fatigue. Negative for night sweats.  HENT: Negative for mouth sores, trouble swallowing, trouble swallowing, mouth dryness and nose dryness.   Eyes: Negative for redness, visual disturbance and dryness.  Respiratory: Negative for cough, hemoptysis, shortness of breath and difficulty breathing.   Cardiovascular: Positive for swelling in legs/feet. Negative for chest pain, palpitations, hypertension and irregular heartbeat.  Gastrointestinal: Negative for abdominal pain, constipation and diarrhea.  Endocrine: Negative for increased urination.  Genitourinary: Negative for painful urination and pelvic pain.  Musculoskeletal: Positive for arthralgias, joint pain, joint swelling and morning stiffness. Negative for myalgias, muscle weakness, muscle tenderness and myalgias.  Skin: Positive for rash. Negative for color change, hair loss, nodules/bumps, skin tightness, ulcers and sensitivity to sunlight.  Allergic/Immunologic: Negative for susceptible to infections.  Neurological: Positive for dizziness, headaches and weakness. Negative for fainting, memory loss and night sweats.  Hematological: Positive for bruising/bleeding tendency. Negative for swollen glands.  Psychiatric/Behavioral: Negative for depressed mood, confusion and sleep disturbance. The patient is not nervous/anxious.     PMFS History:  Patient Active Problem List   Diagnosis Date Noted  . Psoriasis 03/27/2017  . 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 Patellofemoral arthralgia of both knees 02/24/2017  . Hyperuricemia 03/26/2016  . Elevated blood pressure  03/26/2016  . Elevated LFTs 03/26/2016  . Anxiety 03/26/2016  . Psoriatic arthritis (HCC)  03/25/2016  . High risk medication use 03/25/2016  . Cervicalgia 03/25/2016  . Hand pain 03/25/2016  . 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 Anxiety   . Gout   . Hypertension     Family History  Problem Relation Age of Onset  . Cancer Mother        breast   . Bipolar disorder Mother   . Hypertension Father   . Osteoarthritis Father   . Emphysema Maternal Grandfather   . CAD Paternal Grandfather   . Healthy Son   . Healthy Son   . Healthy Daughter   . Healthy Daughter    Past Surgical History:  Procedure Laterality Date  . ANTERIOR CRUCIATE LIGAMENT REPAIR Bilateral Left in 2002, Right 2013  . VASECTOMY     Social History   Social History Narrative  . Not on file     Objective: Vital Signs: BP (!) 126/91 (BP Location: Left Arm, Patient Position: Sitting, Cuff Size: Large)   Pulse 98   Resp 17   Ht  (1.905 m)   Wt 289 lb (131.1 kg)   BMI 36.12 kg/m    Physical Exam  Constitutional: He is oriented to person, place, and time. He appears well-developed and well-nourished.  HENT:  Head: Normocephalic and atraumatic.  Eyes: Pupils are equal, round, and reactive to light. Conjunctivae and EOM are normal.  Neck: Normal range of motion. Neck supple.  Cardiovascular: Normal rate, regular rhythm and normal heart sounds.  Pulmonary/Chest: Effort normal and breath sounds normal.  Abdominal: Soft. Bowel sounds are normal.  Lymphadenopathy:    He has no cervical adenopathy.  Neurological: He is alert and oriented to person, place, and time.  Skin: Skin is warm and dry. Capillary refill takes less than 2 seconds.  Psychiatric: He has a normal mood and affect. His behavior is normal.  Nursing note and vitals reviewed.    Musculoskeletal Exam: C-spine limited range of motion with lateral rotation to the right.  Thoracic and lumbar spine limited range of motion.  He has tenderness in left SI joint.  No midline spinal  tenderness.  Shoulder joints, elbow joints, wrist joints, MCPs, PIPs, DIPs good range of motion.  He has dactylitis of several digits.  He has tenderness of several PIP joints in his hands.  He has tenderness of his right wrist with synovitis and left elbow tenderness. Hip joints, knee joints, ankle joints, MTPs, PIPs, DIPs good range of motion with no synovitis.  He has some warmth in his bilateral knees.  He has some swelling in the dorsal aspect of his bilateral feet.  He has tenderness of the right Achilles tendon.  He has tenderness of trochanteric bursa.   CDAI Exam: CDAI Homunculus Exam:   Tenderness:  RUE: wrist LUE: ulnohumeral and radiohumeral Right hand: 3rd PIP Left hand: 3rd PIP and 4th PIP RLE: tibiofemoral LLE: tibiofemoral  Swelling:  RUE: wrist Right hand: 2nd MCP and 3rd PIP Left hand: 2nd MCP, 2nd PIP and 3rd PIP Left foot: 3rd MTP and 3rd PIP  Joint Counts:  CDAI Tender Joint count: 7 CDAI Swollen Joint count: 6  Investigation: No additional findings.TB Gold: 08/15/2016 Negative  CBC Latest Ref Rng & Units 06/17/2017 01/15/2017 10/24/2016  WBC 3.8 - 10.8 Thousand/uL 6.5 6.8 6.2  Hemoglobin 13.2 - 17.1 g/dL 16.1 09.6 04.5  Hematocrit 38.5 - 50.0 % 48.5 45.7 47.1  Platelets 140 - 400 Thousand/uL 251 219 227   CMP Latest Ref Rng & Units 06/17/2017 01/15/2017 10/24/2016  Glucose 65 - 99 mg/dL 97 409(W) 98  BUN 7 - 25 mg/dL Creatinine 0.60 - 1.35 mg/dL 1.19 1.47 8.29  Sodium 135 - 146 mmol/L 138 139 136  Potassium 3.5 - 5.3 mmol/L 4.7 5.1 4.8  Chloride 98 - 110 mmol/L 101 103 103  CO2 20 - 32 mmol/L Calcium 8.6 - 10.3 mg/dL 9.9 9.8 9.6  Total Protein 6.1 - 8.1 g/dL 7.7 7.2 7.2  Total Bilirubin 0.2 - 1.2 mg/dL 1.2 0.9 0.9  Alkaline Phos 40 - 115 U/L - - 74  AST 10 - 40 U/L 25 67(H) 66(H)  ALT 9 - 46 U/L 36 120(H) 114(H)    Imaging: No results found.  Speciality Comments: No specialty comments available.    Procedures:  No procedures  performed Allergies: Patient has no known allergies.   Assessment / Plan:     Visit Diagnoses: Psoriatic arthritis (HCC) - He has active synovitis and dactylitis on exam today. He has discomfort in his neck, bilateral hands, and bilateral knee joints.  He has swelling and warmth on the dorsal aspect of bilateral feet. He has left SI joint tenderness and right achilles tendon tenderness as well. He has been injecting Cosentyx 150 mg subcutaneously every other week.  He has noticed a benefit since spacing out his doses.  He does experience increased joint stiffness 3 days prior to when he due for his next injection.  He was previously on MTX but discontinued due to elevated LFTs, which were later attributed to fatty liver disease.  He is going to try restarting on MTX 0.4 ml subcutaneous injections once weekly and taking folic acid 1 mg daily. He has left over MTX and folic acid at home and does not need a refill at this time. He is due for labs in June.  We will continue to monitor his LFTs. We will see him back in 3 months for a follow up visit. He previously failed Enbrel, failed Humira, and he can't use prednisone due to mood swings.   Psoriasis: He has psoriasis on his abdomen.  His psoriasis typically clears 2-3 days after his Cosentyx injections.    High risk medication use - Cosentyx 150 mg subcutaneous every other week. -CBC, CMP, and TB gold will be drawn in June.  Plan: CBC with Differential/Platelet, COMPLETE METABOLIC PANEL WITH GFR, QuantiFERON-TB Gold Plus  Idiopathic chronic gout of multiple sites without tophus - He has not had any recent gout flares.  He continues to take allopurinol 300 mg by mouth daily and colchicine 0.6 mg by mouth daily.  His uric acid level will be ordered with his next lab work in June.  - Plan: Uric acid  Elevated LFTs - Per patient Dr. Elnoria Howard diagnosed him with fatty liver and advised him weight loss.   DDD (degenerative disc disease), cervical: He continues to  have limited ROM and chronic pain.  He has been seeing a chiropractor 3 times a week.  His ROM has improved.    Fibromyalgia - He continues to have periodic generalized muscle tenderness and muscle aches  due to fibromyalgia.  He has been taking Cymbalta 30 mg by mouth twice a day, which has improved his pain.  He was encouraged to continue to exercise on a regular basis.    History of hypertension    Orders: Orders Placed This Encounter  Procedures  . CBC with Differential/Platelet  . COMPLETE METABOLIC PANEL WITH GFR  . QuantiFERON-TB Gold Plus  . Uric acid   No orders of the defined types were placed in this encounter.   Face-to-face time spent with patient was 30 minutes.> 50% of time was spent in counseling and coordination of care.  Follow-Up Instructions: Return in about 3 months (around 11/28/2017) for Psoriatic arthritis, Gout.   Gearldine Bienenstock, PA-C I examined and evaluated the patient with [name] PA.  Patient had dactylitis on examination.  Overall he is doing better on Cosentyx.  We decided to add methotrexate 0.4 mL subcu.  He had problems with elevated LFTs in the past.  We will continue to monitor his labs closely.  The plan of care was discussed as noted above.  Pollyann Savoy, MD Note - This record has been created using Animal nutritionist.  Chart creation errors have been sought, but may not always  have been located. Such creation errors do not reflect on  the standard of medical care.

## 2017-08-24 ENCOUNTER — Telehealth: Payer: Self-pay | Admitting: Rheumatology

## 2017-08-24 ENCOUNTER — Encounter: Payer: BLUE CROSS/BLUE SHIELD | Attending: Physical Medicine & Rehabilitation

## 2017-08-24 ENCOUNTER — Ambulatory Visit: Payer: BLUE CROSS/BLUE SHIELD | Admitting: Physical Medicine & Rehabilitation

## 2017-08-24 ENCOUNTER — Encounter: Payer: Self-pay | Admitting: Physical Medicine & Rehabilitation

## 2017-08-24 VITALS — BP 128/92 | HR 90 | Resp 14 | Ht 73.0 in | Wt 296.0 lb

## 2017-08-24 DIAGNOSIS — G894 Chronic pain syndrome: Secondary | ICD-10-CM

## 2017-08-24 DIAGNOSIS — Z5181 Encounter for therapeutic drug level monitoring: Secondary | ICD-10-CM | POA: Diagnosis not present

## 2017-08-24 DIAGNOSIS — M461 Sacroiliitis, not elsewhere classified: Secondary | ICD-10-CM | POA: Diagnosis not present

## 2017-08-24 DIAGNOSIS — M222X2 Patellofemoral disorders, left knee: Secondary | ICD-10-CM

## 2017-08-24 DIAGNOSIS — Z87891 Personal history of nicotine dependence: Secondary | ICD-10-CM | POA: Insufficient documentation

## 2017-08-24 DIAGNOSIS — M222X1 Patellofemoral disorders, right knee: Secondary | ICD-10-CM | POA: Diagnosis not present

## 2017-08-24 DIAGNOSIS — M23307 Other meniscus derangements, unspecified meniscus, left knee: Secondary | ICD-10-CM | POA: Insufficient documentation

## 2017-08-24 DIAGNOSIS — M23306 Other meniscus derangements, unspecified meniscus, right knee: Secondary | ICD-10-CM | POA: Diagnosis not present

## 2017-08-24 DIAGNOSIS — Z79899 Other long term (current) drug therapy: Secondary | ICD-10-CM | POA: Insufficient documentation

## 2017-08-24 DIAGNOSIS — Z79891 Long term (current) use of opiate analgesic: Secondary | ICD-10-CM | POA: Diagnosis not present

## 2017-08-24 DIAGNOSIS — M542 Cervicalgia: Secondary | ICD-10-CM

## 2017-08-24 DIAGNOSIS — L4050 Arthropathic psoriasis, unspecified: Secondary | ICD-10-CM | POA: Insufficient documentation

## 2017-08-24 DIAGNOSIS — M50323 Other cervical disc degeneration at C6-C7 level: Secondary | ICD-10-CM | POA: Insufficient documentation

## 2017-08-24 NOTE — Telephone Encounter (Signed)
Ok to renew temp handicap placard

## 2017-08-24 NOTE — Telephone Encounter (Signed)
Please advise 

## 2017-08-24 NOTE — Telephone Encounter (Signed)
PLEASE ADVISE.

## 2017-08-24 NOTE — Progress Notes (Addendum)
Subjective:    Patient ID: Miguel Evans, male    DOB: 12-01-1980, 37 y.o.   MRN: [id number]  HPI  37 yo male with Psoriatic arthritis on monoclonal antibody therapy, chronic neck and shoulder as well as knee pain.  Has complained of increased low back pain since last visit ~42mo ago Getting DC adjustments for cervical and lumbar pain.  Still has some hip pain but overall improved Weight is up to 295 Pt completed PT , not much improvement Tramadol takes 1-2 times a day Doesn't take while working  stretching app on phone, no active strengthening exercise Pain Inventory Average Pain 6 Pain Right Now 4 My pain is sharp, dull, tingling and aching  In the last 24 hours, has pain interfered with the following? General activity 5 Relation with others 7 Enjoyment of life 7 What TIME of day is your pain at its worst? all Sleep (in general) Fair  Pain is worse with: walking, bending, sitting, inactivity, standing and some activites Pain improves with: rest, heat/ice, therapy/exercise, medication and injections Relief from Meds: 5  Mobility walk without assistance how many minutes can you walk? 20 ability to climb steps?  yes do you drive?  yes Do you have any goals in this area?  yes  Function employed # of hrs/week 32 I need assistance with the following:  dressing and household duties  Neuro/Psych weakness numbness trouble walking spasms  Prior Studies Any changes since last visit?  no  Physicians involved in your care Any changes since last visit?  no   Family History  Problem Relation Age of Onset  . Cancer Mother        breast   . Bipolar disorder Mother   . Hypertension Father   . Osteoarthritis Father   . Emphysema Maternal Grandfather   . CAD Paternal Grandfather   . Healthy Son   . Healthy Son   . Healthy Daughter   . Healthy Daughter    Social History   Socioeconomic History  . Marital status: Unknown    Spouse name: Not on file  . Number of  children: Not on file  . Years of education: Not on file  . Highest education level: Not on file  Occupational History  . Not on file  Social Needs  . Financial resource strain: Not on file  . Food insecurity:    Worry: Not on file    Inability: Not on file  . Transportation needs:    Medical: Not on file    Non-medical: Not on file  Tobacco Use  . Smoking status: Former Smoker    Packs/day: 0.25    Years: 5.00    Pack years: 1.25    Types: Cigarettes    Last attempt to quit: 03/27/2012    Years since quitting: 5.4  . Smokeless tobacco: Former Engineer, water and Sexual Activity  . Alcohol use: Yes    Alcohol/week: 1.2 oz    Types: 2 Cans of beer per week  . Drug use: No  . Sexual activity: Yes    Birth control/protection: Condom  Lifestyle  . Physical activity:    Days per week: Not on file    Minutes per session: Not on file  . Stress: Not on file  Relationships  . Social connections:    Talks on phone: Not on file    Gets together: Not on file    Attends religious service: Not on file    Active member of club  or organization: Not on file    Attends meetings of clubs or organizations: Not on file    Relationship status: Not on file  Other Topics Concern  . Not on file  Social History Narrative  . Not on file   Past Surgical History:  Procedure Laterality Date  . ANTERIOR CRUCIATE LIGAMENT REPAIR Bilateral Left in 2002, Right 2013  . VASECTOMY     Past Medical History:  Diagnosis Date  . Anxiety   . Gout   . Hypertension    BP (!) 128/92 (BP Location: Left Arm, Patient Position: Sitting, Cuff Size: Large)   Pulse 90   Resp 14   Ht  (1.854 m)   Wt 296 lb (134.3 kg)   SpO2 97%   BMI 39.05 kg/m   Opioid Risk Score:   Fall Risk Score:  `1  Depression screen PHQ 2/9  Depression screen PHQ 2/9 01/27/2017  Decreased Interest 1  Down, Depressed, Hopeless 1  PHQ - 2 Score 2  Altered sleeping 2  Tired, decreased energy 2  Change in appetite 2    Feeling bad or failure about yourself  2  Trouble concentrating 0  Moving slowly or fidgety/restless 0  Suicidal thoughts 0  PHQ-9 Score 10  Difficult doing work/chores Somewhat difficult   Review of Systems  Constitutional: Negative.   HENT: Negative.   Eyes: Negative.   Respiratory: Negative.   Cardiovascular: Negative.   Gastrointestinal: Negative.   Endocrine: Negative.   Genitourinary: Negative.   Musculoskeletal: Positive for arthralgias, back pain, gait problem and neck pain.       Spasms  Skin: Negative.   Allergic/Immunologic: Negative.   Neurological: Positive for weakness and numbness.  Hematological: Negative.   Psychiatric/Behavioral: Negative.   All other systems reviewed and are negative.      Objective:   Physical Exam  Constitutional: He appears well-developed and well-nourished.  HENT:  Head: Normocephalic and atraumatic.  Eyes: Pupils are equal, round, and reactive to light. EOM are normal.  Neck:  75% ROM for cervical flexion ext lateral bending and rotation  Musculoskeletal:       Right shoulder: He exhibits pain. He exhibits no tenderness and no deformity.       Left shoulder: He exhibits pain. He exhibits normal range of motion and no deformity.  Pain at endrange shoulder internal and external rotation  Nursing note and vitals reviewed.  Motor strength 5/5 in BUE Sensation normal in BUE  B Knees with full ROM, No effusion, Tenderness along medial and lateral joint lines       Assessment & Plan:  1.  Chronic neck pain C6-7 DDD no evidence of cervical radiculopathy  2.  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

## 2017-08-24 NOTE — Telephone Encounter (Signed)
Left message to advise patient his handicap placard will be waiting for him to pick up on Friday.

## 2017-08-24 NOTE — Telephone Encounter (Signed)
Patient has an appt Friday, and needs a renewal for his Handicap card. Patient gets 6 months at a time. Patient would like to pick form up at appt. Friday.

## 2017-08-24 NOTE — Telephone Encounter (Signed)
Not my patient

## 2017-08-24 NOTE — Patient Instructions (Signed)
Try doing plank exercise on knees and elbows

## 2017-08-28 ENCOUNTER — Encounter: Payer: Self-pay | Admitting: Rheumatology

## 2017-08-28 ENCOUNTER — Ambulatory Visit (INDEPENDENT_AMBULATORY_CARE_PROVIDER_SITE_OTHER): Payer: BLUE CROSS/BLUE SHIELD | Admitting: Rheumatology

## 2017-08-28 VITALS — BP 126/91 | HR 98 | Resp 17 | Ht 75.0 in | Wt 289.0 lb

## 2017-08-28 DIAGNOSIS — L409 Psoriasis, unspecified: Secondary | ICD-10-CM

## 2017-08-28 DIAGNOSIS — Z79899 Other long term (current) drug therapy: Secondary | ICD-10-CM

## 2017-08-28 DIAGNOSIS — M1A09X0 Idiopathic chronic gout, multiple sites, without tophus (tophi): Secondary | ICD-10-CM

## 2017-08-28 DIAGNOSIS — L4050 Arthropathic psoriasis, unspecified: Secondary | ICD-10-CM

## 2017-08-28 DIAGNOSIS — R945 Abnormal results of liver function studies: Secondary | ICD-10-CM

## 2017-08-28 DIAGNOSIS — M797 Fibromyalgia: Secondary | ICD-10-CM

## 2017-08-28 DIAGNOSIS — R7989 Other specified abnormal findings of blood chemistry: Secondary | ICD-10-CM

## 2017-08-28 DIAGNOSIS — M5030 Other cervical disc degeneration, unspecified cervical region: Secondary | ICD-10-CM

## 2017-08-28 DIAGNOSIS — Z8679 Personal history of other diseases of the circulatory system: Secondary | ICD-10-CM

## 2017-08-28 NOTE — Patient Instructions (Signed)
Standing Labs We placed an order today for your standing lab work.    Please come back and get your standing labs in June and every 3 months  We have open lab Monday through Friday from 8:30-11:30 AM and 1:30-4:00 PM  at the office of Dr. Shaili Deveshwar.   You may experience shorter wait times on Monday and Friday afternoons. The office is located at [address], [address], [place], [place] [address] No appointment is necessary.   Labs are drawn by [facility].  You may receive a bill from [facility] for your lab work. If you have any questions regarding directions or hours of operation,  please call [phone].    

## 2017-08-30 LAB — TOXASSURE SELECT,+ANTIDEPR,UR

## 2017-08-31 ENCOUNTER — Telehealth: Payer: Self-pay | Admitting: *Deleted

## 2017-08-31 NOTE — Telephone Encounter (Signed)
Urine drug screen is consistent for prescribed medications but is positive for THC as well.

## 2017-09-01 NOTE — Telephone Encounter (Signed)
Please notify patient of nonnarcotic treatments

## 2017-09-02 NOTE — Telephone Encounter (Addendum)
A letter has been sent through MyChart and will be mailed to the pt as well informing of non narcotic status.

## 2017-10-05 ENCOUNTER — Other Ambulatory Visit: Payer: Self-pay

## 2017-10-05 ENCOUNTER — Telehealth: Payer: Self-pay | Admitting: Rheumatology

## 2017-10-05 DIAGNOSIS — M1A09X0 Idiopathic chronic gout, multiple sites, without tophus (tophi): Secondary | ICD-10-CM

## 2017-10-05 DIAGNOSIS — Z79899 Other long term (current) drug therapy: Secondary | ICD-10-CM

## 2017-10-05 MED ORDER — METHOTREXATE SODIUM CHEMO INJECTION 50 MG/2ML: 10.0000 mg | INTRAMUSCULAR | 0 refills | Status: DC

## 2017-10-05 NOTE — Telephone Encounter (Signed)
Patient here today for labs, but request a refill on MTX. [facility]

## 2017-10-05 NOTE — Addendum Note (Signed)
Addended by: Henriette CombsHATTON, Delitha Elms L on: 10/05/2017 10:44 AM   Modules accepted: Orders

## 2017-10-05 NOTE — Telephone Encounter (Signed)
Last Visit: 08/28/17 Next Visit: 12/04/17 Labs: 06/17/17 WNL  Updated Labs today 10/05/17   Okay to refill per Dr. Corliss Skainseveshwar

## 2017-10-06 NOTE — Progress Notes (Signed)
Uric acid is 7.3.  Ideally we want his uric acid <6.  Please advise him to continue to take Allopurinol 300 mg daily.  CBC and CMP are WNL.

## 2017-10-07 ENCOUNTER — Other Ambulatory Visit: Payer: Self-pay | Admitting: *Deleted

## 2017-10-07 ENCOUNTER — Telehealth: Payer: Self-pay | Admitting: *Deleted

## 2017-10-07 DIAGNOSIS — Z9225 Personal history of immunosupression therapy: Secondary | ICD-10-CM

## 2017-10-07 LAB — COMPLETE METABOLIC PANEL WITH GFR
AG RATIO: 1.7 (calc) (ref 1.0–2.5)
ALBUMIN MSPROF: 4.7 g/dL (ref 3.6–5.1)
ALT: 36 U/L (ref 9–46)
AST: 27 U/L (ref 10–40)
Alkaline phosphatase (APISO): 85 U/L (ref 40–115)
BILIRUBIN TOTAL: 1 mg/dL (ref 0.2–1.2)
BUN: 12 mg/dL (ref 7–25)
CHLORIDE: 102 mmol/L (ref 98–110)
CO2: 29 mmol/L (ref 20–32)
Calcium: 10 mg/dL (ref 8.6–10.3)
Creat: 0.98 mg/dL (ref 0.60–1.35)
GFR, EST AFRICAN AMERICAN: 114 mL/min/{1.73_m2} (ref >=60)
GFR, Est Non African American: 99 mL/min/{1.73_m2} (ref >=60)
GLOBULIN: 2.7 g/dL (ref 1.9–3.7)
GLUCOSE: 96 mg/dL (ref 65–99)
POTASSIUM: 4.8 mmol/L (ref 3.5–5.3)
SODIUM: 138 mmol/L (ref 135–146)
Total Protein: 7.4 g/dL (ref 6.1–8.1)

## 2017-10-07 LAB — QUANTIFERON-TB GOLD PLUS
NIL: 0.06 IU/mL
QuantiFERON-TB Gold Plus: POSITIVE — AB
TB1-NIL: 0.02 [IU]/mL
TB2-NIL: 0.46 [IU]/mL

## 2017-10-07 LAB — CBC WITH DIFFERENTIAL/PLATELET
BASOS ABS: 12 {cells}/uL (ref 0–200)
Basophils Relative: 0.2 %
EOS PCT: 2.2 %
Eosinophils Absolute: 132 cells/uL (ref 15–500)
HEMATOCRIT: 44.2 % (ref 38.5–50.0)
HEMOGLOBIN: 15.2 g/dL (ref 13.2–17.1)
LYMPHS ABS: 2310 {cells}/uL (ref 850–3900)
MCH: 31.5 pg (ref 27.0–33.0)
MCHC: 34.4 g/dL (ref 32.0–36.0)
MCV: 91.5 fL (ref 80.0–100.0)
MONOS PCT: 9 %
MPV: 9.5 fL (ref 7.5–12.5)
NEUTROS ABS: 3006 {cells}/uL (ref 1500–7800)
Neutrophils Relative %: 50.1 %
Platelets: 223 10*3/uL (ref 140–400)
RBC: 4.83 10*6/uL (ref 4.20–5.80)
RDW: 13.8 % (ref 11.0–15.0)
Total Lymphocyte: 38.5 %
WBC mixed population: 540 cells/uL (ref 200–950)
WBC: 6 10*3/uL (ref 3.8–10.8)

## 2017-10-07 LAB — URIC ACID: Uric Acid, Serum: 7.3 mg/dL (ref 4.0–8.0)

## 2017-10-07 NOTE — Progress Notes (Signed)
TB gold positive.  Please advise patient to discontinue Cosentyx and MTX.  We will recheck TB gold ASAP.  Please see if he can come today.

## 2017-10-07 NOTE — Telephone Encounter (Signed)
-----   Message from Gearldine Bienenstockaylor M Dale, PA-C sent at 10/07/2017  8:11 AM EDT ----- TB gold positive.  Please advise patient to discontinue Cosentyx and MTX.  We will recheck TB gold ASAP.  Please see if he can come today.

## 2017-10-09 LAB — QUANTIFERON-TB GOLD PLUS
MITOGEN-NIL: 9.15 [IU]/mL
NIL: 0.03 IU/mL
QuantiFERON-TB Gold Plus: NEGATIVE
TB1-NIL: 0 IU/mL
TB2-NIL: <0 IU/mL

## 2017-10-09 NOTE — Progress Notes (Signed)
I think the TB gold positive was a lab error.  Please have them repeat another TB Gold which should not be charged to his insurance.  He should be able to resume his medications.

## 2017-10-12 ENCOUNTER — Other Ambulatory Visit: Payer: Self-pay | Admitting: *Deleted

## 2017-10-12 ENCOUNTER — Other Ambulatory Visit: Payer: Self-pay

## 2017-10-12 DIAGNOSIS — Z9225 Personal history of immunosupression therapy: Secondary | ICD-10-CM

## 2017-10-14 LAB — QUANTIFERON-TB GOLD PLUS
Mitogen-NIL: >10 IU/mL
NIL: 0.05 IU/mL
QUANTIFERON-TB GOLD PLUS: NEGATIVE
TB1-NIL: 0.01 [IU]/mL
TB2-NIL: 0.01 [IU]/mL

## 2017-10-21 ENCOUNTER — Telehealth: Payer: Self-pay | Admitting: Rheumatology

## 2017-10-21 NOTE — Telephone Encounter (Signed)
Faxed prior authorization information that was sent to our office today.

## 2017-10-21 NOTE — Telephone Encounter (Signed)
A representative from [facility] called stating the prior authorization has expired for patient's prescription of Cosentyx. They state a fax has been sent to the office today to be filled out and faxed back or you can call with verbal orders #[id number] Ref # [id number]

## 2017-10-29 ENCOUNTER — Telehealth (INDEPENDENT_AMBULATORY_CARE_PROVIDER_SITE_OTHER): Payer: Self-pay | Admitting: Radiology

## 2017-10-29 NOTE — Telephone Encounter (Signed)
cosentyx 2 pens 150mg /ml pen injector  Approved 09/26/2017- 10/26/2018

## 2017-11-11 ENCOUNTER — Telehealth: Payer: Self-pay

## 2017-11-11 MED ORDER — SECUKINUMAB 150 MG/ML ~~LOC~~ SOAJ: 150.0000 mg | SUBCUTANEOUS | 0 refills | Status: DC

## 2017-11-11 NOTE — Telephone Encounter (Signed)
Refill request received via fax.   Last visit: 08/28/2017 Next visit: 12/04/2017 Labs: 10/05/2017 WNL  TB gold: 10/12/2017 Negative   Okay to refill cosentyx, per Dr. Corliss Skainseveshwar.

## 2017-11-20 NOTE — Progress Notes (Signed)
Office Visit Note  Patient: Miguel Evans             Date of Birth: January 28, 1981           MRN: [id number]             PCP: Nonnie Done., MD Referring: Nonnie Done., MD Visit Date: 12/04/2017 Occupation: @GUAROCC @  Subjective:  Increased pain   History of Present Illness: Miguel Evans is a 37 y.o. male with history of psoriatic arthritis and psoriasis.  He states he has been having increased pain and stiffness recently.  He describes pain in his neck, lower back and his left knee.  There is no history of any gout flare since the last visit.  He continues to have some pain from fibromyalgia.  Been having increased pain over bilateral trochanteric area.  He is also noticed a knot behind his left knee.  He has been having increased pain in his left knee joint.  He has been going to the chiropractor on a regular basis.  Activities of Daily Living:  Patient reports morning stiffness for 30-45 minutes.   Patient Reports nocturnal pain.  Difficulty dressing/grooming: Reports Difficulty climbing stairs: Reports Difficulty getting out of chair: Reports Difficulty using hands for taps, buttons, cutlery, and/or writing: Reports  Review of Systems  Constitutional: Positive for fatigue.  HENT: Positive for mouth dryness. Negative for mouth sores, trouble swallowing and trouble swallowing.   Eyes: Negative for pain and dryness.  Respiratory: Negative for shortness of breath and difficulty breathing.   Cardiovascular: Negative for chest pain, palpitations and swelling in legs/feet.  Gastrointestinal: Negative for abdominal pain, constipation and diarrhea.  Endocrine: Negative for increased urination.  Genitourinary: Negative for pelvic pain.  Musculoskeletal: Positive for arthralgias, joint pain and morning stiffness. Negative for gait problem and joint swelling.  Skin: Positive for rash. Negative for hair loss.  Allergic/Immunologic: Negative for susceptible to infections.  Neurological:  Negative for dizziness, light-headedness, headaches, memory loss and weakness.  Hematological: Negative for bruising/bleeding tendency.  Psychiatric/Behavioral: Positive for depressed mood and sleep disturbance. Negative for suicidal ideas and confusion. The patient is nervous/anxious.     PMFS History:  Patient Active Problem List   Diagnosis Date Noted  . Psoriasis 03/27/2017  . 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 Patellofemoral arthralgia of both knees 02/24/2017  . Hyperuricemia 03/26/2016  . Elevated blood pressure  03/26/2016  . Elevated LFTs 03/26/2016  . Anxiety 03/26/2016  . Psoriatic arthritis (HCC) 03/25/2016  . High risk medication use 03/25/2016  . Cervicalgia 03/25/2016  . Hand pain 03/25/2016  . 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 Anxiety   . Gout   . Hypertension     Family History  Problem Relation Age of Onset  . Cancer Mother        breast   . Bipolar disorder Mother   . Hypertension Father   . Osteoarthritis Father   . Emphysema Maternal Grandfather   . CAD Paternal Grandfather   . Healthy Son   . Healthy Son   . Healthy Daughter   . Healthy Daughter    Past Surgical History:  Procedure Laterality Date  . ANTERIOR CRUCIATE LIGAMENT REPAIR Bilateral Left in 2002, Right 2013  . VASECTOMY     Social History   Social History Narrative  . Not on file    Objective: Vital Signs: BP (!) 134/93 (BP Location: Left Arm, Patient Position: Sitting, Cuff Size:  Large)   Pulse 97   Resp 16   Ht 6\' 3"  (1.905 m)   Wt 298 lb (135.2 kg)   BMI 37.25 kg/m    Physical Exam  Constitutional: He is oriented to person, place, and time. He appears well-developed and well-nourished.  HENT:  Head: Normocephalic and atraumatic.  Eyes: Pupils are equal, round, and reactive to light. Conjunctivae and EOM are normal.  Neck: Normal range of motion. Neck supple.  Cardiovascular: Normal rate, regular rhythm and normal heart  sounds.  Pulmonary/Chest: Effort normal and breath sounds normal.  Abdominal: Soft. Bowel sounds are normal.  Neurological: He is alert and oriented to person, place, and time.  Skin: Skin is warm and dry. Capillary refill takes less than 2 seconds.  Psychiatric: He has a normal mood and affect. His behavior is normal.  Nursing note and vitals reviewed.    Musculoskeletal Exam: C-spine limited range of motion with discomfort.  Thoracic and lumbar spine good range of motion.  He has some tenderness over right SI joint.  He has tenderness over bilateral trochanteric bursa.  Shoulder joints elbow joints wrist joints MCPs PIPs DIPs were in good range of motion.  He has tenderness on palpation over some of his MCPs and PIP joints.  No synovitis was noted.  Hip joints were in good range of motion.  A palpable popliteal cyst was noted behind his left knee.  Ankle joints MTPs PIPs were in good range of motion with no synovitis.  Was no evidence of Achilles tendinitis or plantar fasciitis.  CDAI Exam: CDAI Score: 3  Patient Global Assessment: 6 (mm); Provider Global Assessment: 4 (mm) Swollen: 0 ; Tender: 3  Joint Exam      Right  Left  MCP 3   Tender     Sacroiliac   Tender     Knee      Tender     Investigation: No additional findings.  Imaging: No results found.  Recent Labs: Lab Results  Component Value Date   WBC 6.0 10/05/2017   HGB 15.2 10/05/2017   PLT 223 10/05/2017   NA 138 10/05/2017   K 4.8 10/05/2017   CL 102 10/05/2017   CO2 29 10/05/2017   GLUCOSE 96 10/05/2017   BUN 12 10/05/2017   CREATININE 0.98 10/05/2017   BILITOT 1.0 10/05/2017   ALKPHOS 74 10/24/2016   AST 27 10/05/2017   ALT 36 10/05/2017   PROT 7.4 10/05/2017   ALBUMIN 4.7 10/24/2016   CALCIUM 10.0 10/05/2017   GFRAA 114 10/05/2017   QFTBGOLDPLUS NEGATIVE 10/12/2017    Speciality Comments: No specialty comments available.  Procedures:  Large Joint Inj: L knee on 12/04/2017 10:57 AM Indications:  pain Details: 18 G 1.5 in needle, medial approach  Arthrogram: No  Medications: 3 mL lidocaine 1 %; 60 mg triamcinolone acetonide 40 MG/ML Aspirate: 32 mL clear; sent for lab analysis Outcome: tolerated well, no immediate complications  Left knee popliteal cyst Procedure, treatment alternatives, risks and benefits explained, specific risks discussed. Consent was given by the patient. Immediately prior to procedure a time out was called to verify the correct patient, procedure, equipment, support staff and site/side marked as required. Patient was prepped and draped in the usual sterile fashion.     Allergies: Patient has no known allergies.   Assessment / Plan:     Visit Diagnoses: Psoriatic arthritis (HCC) -complains of increased pain and discomfort in multiple joints.  His pain is mostly in his cervical spine and  lumbar spine.  He has some discomfort over left SI joint.  Is been also having discomfort in his left knee joint.  Plan: Sedimentation rate  Psoriasis-he has no active psoriasis.  High risk medication use - Cosentyx 150 mg subcutaneous every other week. He is going to try restarting on MTX 0.4 ml subcutaneous injections once weekly and taking folic acid 1 mg daily.  Methotrexate dose was decreased due to elevation in LFTs.  His LFTs are normal.  We will increase methotrexate 2.6 mL subcu weekly.- Plan: CBC with Differential/Platelet, COMPLETE METABOLIC PANEL WITH GFR today and then every 3 months.  Synovial cyst of left popliteal space-large popliteal cyst which was causing discomfort.  After informed consent was obtained the area was prepped in sterile fashion.  The synovial fluid was aspirated and sent for cell count crystals and culture.  Idiopathic chronic gout of multiple sites without tophus-he has not had any gout flare.  DDD (degenerative disc disease), cervical chronic pain  Fibromyalgia-he continues to have some generalized pain from fibromyalgia.  Chronic left SI  joint pain-he continues to have some discomfort.  Trochanteric bursitis of both hips-he has bilateral trochanteric bursitis which continues to be a problem.  Stretching exercises were discussed.  History of hypertension -his blood pressure is a still elevated.  Orders: Orders Placed This Encounter  Procedures  . Large Joint Inj: L knee  . Anaerobic and Aerobic Culture  . CBC with Differential/Platelet  . COMPLETE METABOLIC PANEL WITH GFR  . Sedimentation rate  . Synovial cell count + diff, w/ crystals   No orders of the defined types were placed in this encounter.   Face-to-face time spent with patient was 30 minutes. Greater than 50% of time was spent in counseling and coordination of care.  Follow-Up Instructions: Return in about 6 months (around 06/06/2018) for Psoriatic arthritis, Gout, Fibromyalgia, DDD.   Pollyann SavoyShaili Bianna Haran, MD  Note - This record has been created using Animal nutritionistDragon software.  Chart creation errors have been sought, but may not always  have been located. Such creation errors do not reflect on  the standard of medical care.

## 2017-12-04 ENCOUNTER — Encounter: Payer: Self-pay | Admitting: Physician Assistant

## 2017-12-04 ENCOUNTER — Ambulatory Visit: Payer: BLUE CROSS/BLUE SHIELD | Admitting: Rheumatology

## 2017-12-04 VITALS — BP 134/93 | HR 97 | Resp 16 | Ht 75.0 in | Wt 298.0 lb

## 2017-12-04 DIAGNOSIS — M7122 Synovial cyst of popliteal space [Baker], left knee: Secondary | ICD-10-CM

## 2017-12-04 DIAGNOSIS — M533 Sacrococcygeal disorders, not elsewhere classified: Secondary | ICD-10-CM

## 2017-12-04 DIAGNOSIS — L409 Psoriasis, unspecified: Secondary | ICD-10-CM

## 2017-12-04 DIAGNOSIS — M7061 Trochanteric bursitis, right hip: Secondary | ICD-10-CM

## 2017-12-04 DIAGNOSIS — M7062 Trochanteric bursitis, left hip: Secondary | ICD-10-CM

## 2017-12-04 DIAGNOSIS — M1A09X0 Idiopathic chronic gout, multiple sites, without tophus (tophi): Secondary | ICD-10-CM

## 2017-12-04 DIAGNOSIS — M5030 Other cervical disc degeneration, unspecified cervical region: Secondary | ICD-10-CM

## 2017-12-04 DIAGNOSIS — M797 Fibromyalgia: Secondary | ICD-10-CM

## 2017-12-04 DIAGNOSIS — Z8679 Personal history of other diseases of the circulatory system: Secondary | ICD-10-CM

## 2017-12-04 DIAGNOSIS — G8929 Other chronic pain: Secondary | ICD-10-CM

## 2017-12-04 DIAGNOSIS — L4050 Arthropathic psoriasis, unspecified: Secondary | ICD-10-CM

## 2017-12-04 DIAGNOSIS — Z79899 Other long term (current) drug therapy: Secondary | ICD-10-CM | POA: Diagnosis not present

## 2017-12-04 MED ORDER — LIDOCAINE HCL 1 % IJ SOLN
3.0000 mL | INTRAMUSCULAR | Status: AC | PRN
  Administered 2017-12-04: 3 mL

## 2017-12-04 MED ORDER — TRIAMCINOLONE ACETONIDE 40 MG/ML IJ SUSP
60.0000 mg | INTRAMUSCULAR | Status: AC | PRN
  Administered 2017-12-04: 60 mg via INTRA_ARTICULAR

## 2017-12-04 NOTE — Patient Instructions (Signed)
Standing Labs We placed an order today for your standing lab work.    Please come back and get your standing labs in November and every 3 months   We have open lab Monday through Friday from 8:30-11:30 AM and 1:30-4:00 PM  at the office of Dr. Giovanni Bath.   You may experience shorter wait times on Monday and Friday afternoons. The office is located at [address], [address], [place], [place] [address] No appointment is necessary.   Labs are drawn by [facility].  You may receive a bill from [facility] for your lab work. If you have any questions regarding directions or hours of operation,  please call [phone].     

## 2017-12-05 LAB — COMPLETE METABOLIC PANEL WITH GFR
AG Ratio: 2 (calc) (ref 1.0–2.5)
ALBUMIN MSPROF: 4.9 g/dL (ref 3.6–5.1)
ALKALINE PHOSPHATASE (APISO): 75 U/L (ref 40–115)
ALT: 34 U/L (ref 9–46)
AST: 24 U/L (ref 10–40)
BUN: 15 mg/dL (ref 7–25)
CHLORIDE: 104 mmol/L (ref 98–110)
CO2: 29 mmol/L (ref 20–32)
Calcium: 10 mg/dL (ref 8.6–10.3)
Creat: 0.96 mg/dL (ref 0.60–1.35)
GFR, Est African American: 117 mL/min/{1.73_m2} (ref >=60)
GFR, Est Non African American: 101 mL/min/{1.73_m2} (ref >=60)
GLOBULIN: 2.5 g/dL (ref 1.9–3.7)
GLUCOSE: 84 mg/dL (ref 65–99)
Potassium: 4.9 mmol/L (ref 3.5–5.3)
SODIUM: 140 mmol/L (ref 135–146)
Total Bilirubin: 0.8 mg/dL (ref 0.2–1.2)
Total Protein: 7.4 g/dL (ref 6.1–8.1)

## 2017-12-05 LAB — CBC WITH DIFFERENTIAL/PLATELET
Basophils Absolute: 38 cells/uL (ref 0–200)
Basophils Relative: 0.6 %
EOS ABS: 179 {cells}/uL (ref 15–500)
Eosinophils Relative: 2.8 %
HCT: 45.6 % (ref 38.5–50.0)
HEMOGLOBIN: 15.7 g/dL (ref 13.2–17.1)
Lymphs Abs: 2547 cells/uL (ref 850–3900)
MCH: 31.8 pg (ref 27.0–33.0)
MCHC: 34.4 g/dL (ref 32.0–36.0)
MCV: 92.3 fL (ref 80.0–100.0)
MONOS PCT: 9.8 %
MPV: 9.6 fL (ref 7.5–12.5)
NEUTROS ABS: 3008 {cells}/uL (ref 1500–7800)
NEUTROS PCT: 47 %
Platelets: 229 10*3/uL (ref 140–400)
RBC: 4.94 10*6/uL (ref 4.20–5.80)
RDW: 13 % (ref 11.0–15.0)
Total Lymphocyte: 39.8 %
WBC mixed population: 627 cells/uL (ref 200–950)
WBC: 6.4 10*3/uL (ref 3.8–10.8)

## 2017-12-05 LAB — SEDIMENTATION RATE: Sed Rate: 6 mm/h (ref 0–15)

## 2017-12-10 LAB — SYNOVIAL CELL COUNT + DIFF, W/ CRYSTALS
Basophils, %: 0 %
Eosinophils-Synovial: 0 % (ref 0–2)
Lymphocytes-Synovial Fld: 33 % (ref 0–74)
Monocyte/Macrophage: 47 % (ref 0–69)
NEUTROPHIL, SYNOVIAL: 3 % (ref 0–24)
Synoviocytes, %: 17 % — ABNORMAL HIGH (ref 0–15)
WBC, SYNOVIAL: 242 {cells}/uL — AB (ref <150)

## 2017-12-10 LAB — ANAEROBIC AND AEROBIC CULTURE
AER RESULT:: NO GROWTH
MICRO NUMBER: 91014125
MICRO NUMBER:: 91014104
SPECIMEN QUALITY: ADEQUATE
SPECIMEN QUALITY: ADEQUATE

## 2018-01-06 ENCOUNTER — Other Ambulatory Visit: Payer: Self-pay | Admitting: Rheumatology

## 2018-01-06 MED ORDER — METHOTREXATE SODIUM CHEMO INJECTION 50 MG/2ML: 10.0000 mg | INTRAMUSCULAR | 0 refills | Status: DC

## 2018-01-06 NOTE — Telephone Encounter (Signed)
Last Visit: 12/04/17 Next visit: 06/11/18  Labs: 12/04/17 CBC and CMP WNL.   Okay to refill per Dr. Corliss Skains

## 2018-01-07 ENCOUNTER — Other Ambulatory Visit: Payer: Self-pay | Admitting: *Deleted

## 2018-01-07 MED ORDER — ALLOPURINOL 300 MG PO TABS: 300.0000 mg | ORAL_TABLET | Freq: Every day | ORAL | 1 refills | Status: DC

## 2018-01-07 NOTE — Telephone Encounter (Signed)
Refill request received via fax  Last Visit: 12/04/17 Next visit: 06/11/18  Labs: 12/04/17 CBC and CMP WNL.   Okay to refill per Dr. Corliss Skains

## 2018-01-27 ENCOUNTER — Encounter: Payer: Self-pay | Admitting: Rheumatology

## 2018-02-05 ENCOUNTER — Other Ambulatory Visit: Payer: Self-pay | Admitting: *Deleted

## 2018-02-05 MED ORDER — SECUKINUMAB 150 MG/ML ~~LOC~~ SOAJ: 150.0000 mg | SUBCUTANEOUS | 0 refills | Status: DC

## 2018-02-05 NOTE — Telephone Encounter (Signed)
Refill request received via fax  Last Visit: 12/04/17 Next visit: 06/11/18  Labs: 8/23/19CBC and CMP WNL. TB Gold: 10/12/17 Neg   Okay to refill per Dr. Corliss Skains

## 2018-02-15 NOTE — Addendum Note (Signed)
Addended by: Doreene Eland on: 02/15/2018 03:07 PM   Modules accepted: Orders

## 2018-02-23 ENCOUNTER — Ambulatory Visit: Payer: BLUE CROSS/BLUE SHIELD | Admitting: Physical Medicine & Rehabilitation

## 2018-02-25 ENCOUNTER — Telehealth (INDEPENDENT_AMBULATORY_CARE_PROVIDER_SITE_OTHER): Payer: Self-pay | Admitting: Rheumatology

## 2018-02-25 NOTE — Telephone Encounter (Signed)
[facility] [employer] [facility] checking status of request. I tried calling her back but was unable to speak with her. I faxed her back advising their [facility] and gave her number to contact directly.

## 2018-03-09 ENCOUNTER — Encounter: Payer: Self-pay | Admitting: Rheumatology

## 2018-03-10 ENCOUNTER — Other Ambulatory Visit: Payer: Self-pay

## 2018-03-10 DIAGNOSIS — Z79899 Other long term (current) drug therapy: Secondary | ICD-10-CM

## 2018-03-11 LAB — CBC WITH DIFFERENTIAL/PLATELET
Basophils Absolute: 28 cells/uL (ref 0–200)
Basophils Relative: 0.5 %
EOS PCT: 2.7 %
Eosinophils Absolute: 149 cells/uL (ref 15–500)
HEMATOCRIT: 45.2 % (ref 38.5–50.0)
HEMOGLOBIN: 15.2 g/dL (ref 13.2–17.1)
LYMPHS ABS: 2233 {cells}/uL (ref 850–3900)
MCH: 31.7 pg (ref 27.0–33.0)
MCHC: 33.6 g/dL (ref 32.0–36.0)
MCV: 94.4 fL (ref 80.0–100.0)
MPV: 9.5 fL (ref 7.5–12.5)
Monocytes Relative: 9.7 %
NEUTROS PCT: 46.5 %
Neutro Abs: 2558 cells/uL (ref 1500–7800)
Platelets: 235 10*3/uL (ref 140–400)
RBC: 4.79 10*6/uL (ref 4.20–5.80)
RDW: 12.6 % (ref 11.0–15.0)
TOTAL LYMPHOCYTE: 40.6 %
WBC: 5.5 10*3/uL (ref 3.8–10.8)
WBCMIX: 534 {cells}/uL (ref 200–950)

## 2018-03-11 LAB — COMPLETE METABOLIC PANEL WITH GFR
AG Ratio: 2 (calc) (ref 1.0–2.5)
ALBUMIN MSPROF: 4.7 g/dL (ref 3.6–5.1)
ALKALINE PHOSPHATASE (APISO): 72 U/L (ref 40–115)
ALT: 32 U/L (ref 9–46)
AST: 24 U/L (ref 10–40)
BILIRUBIN TOTAL: 0.9 mg/dL (ref 0.2–1.2)
BUN: 13 mg/dL (ref 7–25)
CO2: 27 mmol/L (ref 20–32)
Calcium: 9.6 mg/dL (ref 8.6–10.3)
Chloride: 104 mmol/L (ref 98–110)
Creat: 0.99 mg/dL (ref 0.60–1.35)
GFR, Est African American: 113 mL/min/{1.73_m2} (ref >=60)
GFR, Est Non African American: 98 mL/min/{1.73_m2} (ref >=60)
GLOBULIN: 2.4 g/dL (ref 1.9–3.7)
Glucose, Bld: 93 mg/dL (ref 65–99)
POTASSIUM: 5 mmol/L (ref 3.5–5.3)
SODIUM: 140 mmol/L (ref 135–146)
Total Protein: 7.1 g/dL (ref 6.1–8.1)

## 2018-04-02 ENCOUNTER — Other Ambulatory Visit: Payer: Self-pay | Admitting: Rheumatology

## 2018-04-02 NOTE — Telephone Encounter (Signed)
Last Visit: 12/04/17 Next visit: 06/11/18  Labs: 03/10/18 WNL  Okay to refill per Dr. Corliss Skainseveshwar

## 2018-04-22 ENCOUNTER — Other Ambulatory Visit: Payer: Self-pay | Admitting: *Deleted

## 2018-04-22 MED ORDER — METHOTREXATE SODIUM CHEMO INJECTION 50 MG/2ML: 10.0000 mg | INTRAMUSCULAR | 0 refills | Status: DC

## 2018-04-22 NOTE — Telephone Encounter (Signed)
Refill request received via fax   Last Visit: 12/04/17 Next visit: 06/11/18  Labs: 03/12/18 WNL TB Gold: 10/12/17 Neg   Okay to refill per Dr. Corliss Skains

## 2018-04-27 ENCOUNTER — Other Ambulatory Visit: Payer: Self-pay | Admitting: *Deleted

## 2018-04-27 MED ORDER — SECUKINUMAB 150 MG/ML ~~LOC~~ SOAJ: 150.0000 mg | SUBCUTANEOUS | 0 refills | Status: DC

## 2018-04-27 NOTE — Telephone Encounter (Signed)
Refill request received via fax  Last Visit: 12/04/17 Next visit: 06/11/18  Labs: 03/10/18 WNL TB Gold: 10/12/17 Neg   Okay to refill per Dr. Corliss Skains

## 2018-05-28 NOTE — Progress Notes (Signed)
Office Visit Note  Patient: Miguel Evans             Date of Birth: Jan 22, 1981           MRN: [id number]             PCP: Nonnie DoneSlatosky, John J., MD Referring: Nonnie DoneSlatosky, John J., MD Visit Date: 06/11/2018 Occupation: @GUAROCC @  Subjective:  Left knee baker's cyst    History of Present Illness: Miguel Evans is a 38 y.o. male with history of psoriatic arthritis, gout, and fibromyalgia. He is on Cosentyx 150 mg sq injections every other week, MTX 0.6 ml sq once weekly and folic acid 1 mg po daily.  Patient reports that he does not feel as though Cosentyx is as effective.  He has not noticed any improvement since adding on methotrexate.  He states that on the days that he injects both methotrexate and Cosentyx he develops significant nausea and fatigue for 1 to 2 days.  He reports that 1 month ago he fell on outstretched arm and has had right shoulder pain since.  He states that he heard a "pop" and has had limited and painful range of motion since.  He reports that his left knee Baker's cyst returned in November 2019 after being aspirated in August.  He reports he is having pain and swelling in both knee joints.  He is also having discomfort in his left elbow and has noticed tenderness and swelling.  He is having pain in bilateral feet.  He is experiencing left Achilles tendinitis.  He has left SI joint tenderness.  He continues to chronic neck pain and sees a Landchiropractor.  He states he currently has psoriasis on his stomach.  Activities of Daily Living:  Patient reports morning stiffness for 1 hour.   Patient Reports nocturnal pain.  Difficulty dressing/grooming: Denies Difficulty climbing stairs: Reports Difficulty getting out of chair: Reports Difficulty using hands for taps, buttons, cutlery, and/or writing: Reports  Review of Systems  Constitutional: Positive for fatigue. Negative for night sweats.  HENT: Positive for mouth dryness. Negative for mouth sores and nose dryness.   Eyes: Negative for  redness, visual disturbance and dryness.  Respiratory: Positive for cough. Negative for hemoptysis, shortness of breath and difficulty breathing.   Cardiovascular: Negative for chest pain, palpitations, hypertension, irregular heartbeat and swelling in legs/feet.  Gastrointestinal: Negative for blood in stool, constipation and diarrhea.  Endocrine: Negative for increased urination.  Genitourinary: Negative for painful urination.  Musculoskeletal: Positive for arthralgias, joint pain, joint swelling and morning stiffness. Negative for myalgias, muscle weakness, muscle tenderness and myalgias.  Skin: Negative for color change, rash, hair loss, nodules/bumps, skin tightness, ulcers and sensitivity to sunlight.  Allergic/Immunologic: Negative for susceptible to infections.  Neurological: Positive for headaches. Negative for dizziness, fainting, memory loss, night sweats and weakness.  Hematological: Negative for swollen glands.  Psychiatric/Behavioral: Positive for depressed mood. Negative for sleep disturbance. The patient is nervous/anxious.     PMFS History:  Patient Active Problem List   Diagnosis Date Noted  . Psoriasis 03/27/2017  . 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 Patellofemoral arthralgia of both knees 02/24/2017  . Hyperuricemia 03/26/2016  . Elevated blood pressure  03/26/2016  . Elevated LFTs 03/26/2016  . Anxiety 03/26/2016  . Psoriatic arthritis (HCC) 03/25/2016  . High risk medication use 03/25/2016  . Cervicalgia 03/25/2016  . Hand pain 03/25/2016  . 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 Anxiety   .  Gout   . Hypertension     Family History  Problem Relation Age of Onset  . Cancer Mother        breast   . Bipolar disorder Mother   . Hypertension Father   . Osteoarthritis Father   . Emphysema Maternal Grandfather   . CAD Paternal Grandfather   . Healthy Son   . Healthy Son   . Healthy Daughter   . Healthy Daughter     Past Surgical History:  Procedure Laterality Date  . ANTERIOR CRUCIATE LIGAMENT REPAIR Bilateral Left in 2002, Right 2013  . VASECTOMY     Social History   Social History Narrative  . Not on file    There is no immunization history on file for this patient.   Objective: Vital Signs: BP (!) 145/99 (BP Location: Left Arm, Patient Position: Sitting, Cuff Size: Large)   Pulse 91   Resp 16   Ht 6\' 3"  (1.905 m)   Wt 294 lb 6.4 oz (133.5 kg)   BMI 36.80 kg/m    Physical Exam Vitals signs and nursing note reviewed.  Constitutional:      Appearance: He is well-developed.  HENT:     Head: Normocephalic and atraumatic.  Eyes:     Conjunctiva/sclera: Conjunctivae normal.     Pupils: Pupils are equal, round, and reactive to light.  Neck:     Musculoskeletal: Normal range of motion and neck supple.  Cardiovascular:     Rate and Rhythm: Normal rate and regular rhythm.     Heart sounds: Normal heart sounds.  Pulmonary:     Effort: Pulmonary effort is normal.     Breath sounds: Normal breath sounds.  Abdominal:     General: Bowel sounds are normal.     Palpations: Abdomen is soft.  Lymphadenopathy:     Cervical: No cervical adenopathy.  Skin:    General: Skin is warm and dry.     Capillary Refill: Capillary refill takes less than 2 seconds.  Neurological:     Mental Status: He is alert and oriented to person, place, and time.  Psychiatric:        Behavior: Behavior normal.      Musculoskeletal Exam: C-spine, thoracic spine, and lumbar spine limited ROM.  No midline spinal tenderness.  Left SI joint tenderness.  Right shoulder full but painful ROM.  Tenderness at the rotator cuff insertion site anteriorly.  Left shoulder full ROM.  Good ROM of both elbow joints.  Left elbow joint warmth and tenderness.  Wrist joints, MCPs, PIPs ,and DIPs good ROM.  Complete fist formation bilaterally.  Right 1st MCP joint tenderness.  Hip joints good ROM with no discomfort.  Knee joints good  ROM.  No warmth or effusion of knee joints.  Left prepatellar bursitis and pes anserine bursitis. Left baker's cyst palpable.  No tenderness or swelling of ankle joints.  No tenderness of MTPs or PIPs.  Left achilles tendonitis.     CDAI Exam: CDAI Score: Not documented Patient Global Assessment: Not documented; Provider Global Assessment: Not documented Swollen: 1 ; Tender: 3  Joint Exam      Right  Left  Elbow     Swollen Tender  MCP 1   Tender     Sacroiliac      Tender     Investigation: No additional findings.  Imaging: No results found.  Recent Labs: Lab Results  Component Value Date   WBC 5.5 03/10/2018   HGB 15.2 03/10/2018  PLT 235 03/10/2018   NA 140 03/10/2018   K 5.0 03/10/2018   CL 104 03/10/2018   CO2 27 03/10/2018   GLUCOSE 93 03/10/2018   BUN 13 03/10/2018   CREATININE 0.99 03/10/2018   BILITOT 0.9 03/10/2018   ALKPHOS 74 10/24/2016   AST 24 03/10/2018   ALT 32 03/10/2018   PROT 7.1 03/10/2018   ALBUMIN 4.7 10/24/2016   CALCIUM 9.6 03/10/2018   GFRAA 113 03/10/2018   QFTBGOLDPLUS NEGATIVE 10/12/2017    Speciality Comments: No specialty comments available.  Procedures:  Large Joint Inj: L knee on 06/11/2018 1:46 PM Indications: pain Details: 27 G 1.5 in needle, ultrasound-guided medial approach  Arthrogram: No  Medications: 3 mL lidocaine 1 %; 60 mg triamcinolone acetonide 40 MG/ML Aspirate: 27 mL (27 ml of clear fluid aspirated from left popliteal cyst) Outcome: tolerated well, no immediate complications Procedure, treatment alternatives, risks and benefits explained, specific risks discussed. Consent was given by the patient. Immediately prior to procedure a time out was called to verify the correct patient, procedure, equipment, support staff and site/side marked as required. Patient was prepped and draped in the usual sterile fashion.     Allergies: Patient has no known allergies.    Assessment / Plan:     Visit Diagnoses: Psoriatic  arthritis [facility]): He presents today with left prepatellar bursitis, an inflammed left popliteal cyst, left medial epicondylitis, left SI joint tenderness, and tenderness of the right first MCP joint.  He continues to inject Cosentyx 150 mg subcutaneously every 2 weeks.  He restarted on methotrexate 0.6 mL subcutaneous weekly injections in August 2019. He has not noticed any improvement with combination therapy.  He has been experiencing nausea and significant fatigue when he injects both methotrexate and Cosentyx on the same day.  He has had an inadequate response to Enbrel and Humira in the past.  We are hesitant to increase the dose of methotrexate due to history of elevated LFTs but he will increase MTX to 0.8 ml sq once weekly and we will recheck lab work in 2 weeks.  If his labs are stable and he does notice much improvement he will increase to 1.0 ml sq once weekly.  He declined a prescription for prednisone.  He requested for the left popliteal cyst to be aspirated today.  He tolerated the procedure well.  He was advised to notify us if he develops increased joint pain or joint swelling.  He will follow up in 3 months.   Psoriasis: He has patches of psoriasis on the anterior surface of his abdomen.   High risk medication use - Cosentyx 150 mg subcutaneous every other week, MTX 0.6 ml sq inj once weekly, folic acid 1 mg daily.(inadequate response to Enbrel and Humira).  CBC and CMP will be drawn today.  TB gold was negative on 10/12/2017.  He will return for lab work in May and every 3 months to monitor for drug toxicity.  He has not had any recent infections.- Plan: CBC with Differential/Platelet, COMPLETE METABOLIC PANEL WITH GFR  Popliteal cyst, left: He has tenderness and inflammation of the left popliteal cyst.  An aspiration was performed and 27 ml of clear fluid was drawn off.  Fluid was not sent for analysis since it was sent on 12/04/17.  Idiopathic chronic gout of multiple sites without tophus:  He has not had any recent gout flares.  He is taking allopurinol 3 mg 1 tablet by mouth daily and colchicine 0.6 mg by mouth once  daily.  Uric acid was 7.3 on 10/05/2017.  We will check uric acid with his next lab work.  A future order was placed today.   DDD (degenerative disc disease), cervical: He has limited range of motion with lateral rotation.  He has good flexion extension.  He has chronic pain in his neck and sees a chiropractor on a regular basis.  He is not experiencing any symptoms of radiculopathy at this time.  Fibromyalgia: He has generalized muscle aches and muscle tenderness due to fibromyalgia.  Trochanteric bursitis of both hips: He has no tenderness over trochanteric bursa at this time.  Chronic left SI joint pain: He has left SI joint tenderness on exam.  History of hypertension: Blood pressure is 145/99 today.  Elevated LFTs: We will check lab work today and then in 2 weeks and then every 3 months to monitor for drug toxicity.  Chronic right shoulder pain: He fell on an outstretched arm 1 month ago and heard a pop in his right shoulder.  He has painful external rotation.  We will order an x-ray of the right shoulder followed by an MRI to rule out a tear.  Orders: Orders Placed This Encounter  Procedures  . Large Joint Inj  . Large Joint Inj  . XR Shoulder Right  . MR SHOULDER RIGHT WO CONTRAST  . CBC with Differential/Platelet  . COMPLETE METABOLIC PANEL WITH GFR  . Uric acid   No orders of the defined types were placed in this encounter.   Face-to-face time spent with patient was 30 minutes. Greater than 50% of time was spent in counseling and coordination of care.  Follow-Up Instructions: Return in about 3 months (around 09/09/2018) for Psoriatic arthritis, Gout, Fibromyalgia.   Gearldine Bienenstock, PA-C   I examined and evaluated the patient with [name] PA.  Patient is having a flare with increased pain and swelling in multiple joints.  He also had a Baker's  cyst present today.  Per his request after informed consent was obtained, under ultrasound guidance the popliteal cyst was aspirated.  He declined prednisone taper.  He will continue Cosentyx with increased dose of methotrexate for right now.  If he has an adequate response to Cosentyx we may switch him to [employer].  The plan of care was discussed as noted above.  Pollyann Savoy, MD  Note - This record has been created using Animal nutritionist.  Chart creation errors have been sought, but may not always  have been located. Such creation errors do not reflect on  the standard of medical care.

## 2018-06-09 ENCOUNTER — Other Ambulatory Visit: Payer: Self-pay | Admitting: Rheumatology

## 2018-06-09 NOTE — Telephone Encounter (Signed)
Last Visit: 12/04/17 Next visit: 06/11/18  Labs: 03/12/18 WNL  Okay to refill per Dr. Corliss Skains

## 2018-06-11 ENCOUNTER — Ambulatory Visit: Payer: BLUE CROSS/BLUE SHIELD | Admitting: Physician Assistant

## 2018-06-11 ENCOUNTER — Encounter: Payer: Self-pay | Admitting: Physician Assistant

## 2018-06-11 ENCOUNTER — Ambulatory Visit (INDEPENDENT_AMBULATORY_CARE_PROVIDER_SITE_OTHER): Payer: BLUE CROSS/BLUE SHIELD

## 2018-06-11 VITALS — BP 145/99 | HR 91 | Resp 16 | Ht 75.0 in | Wt 294.4 lb

## 2018-06-11 DIAGNOSIS — L409 Psoriasis, unspecified: Secondary | ICD-10-CM | POA: Diagnosis not present

## 2018-06-11 DIAGNOSIS — Z8679 Personal history of other diseases of the circulatory system: Secondary | ICD-10-CM

## 2018-06-11 DIAGNOSIS — M533 Sacrococcygeal disorders, not elsewhere classified: Secondary | ICD-10-CM

## 2018-06-11 DIAGNOSIS — M7122 Synovial cyst of popliteal space [Baker], left knee: Secondary | ICD-10-CM | POA: Diagnosis not present

## 2018-06-11 DIAGNOSIS — G8929 Other chronic pain: Secondary | ICD-10-CM

## 2018-06-11 DIAGNOSIS — L4050 Arthropathic psoriasis, unspecified: Secondary | ICD-10-CM | POA: Diagnosis not present

## 2018-06-11 DIAGNOSIS — M25511 Pain in right shoulder: Secondary | ICD-10-CM

## 2018-06-11 DIAGNOSIS — R945 Abnormal results of liver function studies: Secondary | ICD-10-CM

## 2018-06-11 DIAGNOSIS — M1A09X0 Idiopathic chronic gout, multiple sites, without tophus (tophi): Secondary | ICD-10-CM | POA: Diagnosis not present

## 2018-06-11 DIAGNOSIS — R7989 Other specified abnormal findings of blood chemistry: Secondary | ICD-10-CM

## 2018-06-11 DIAGNOSIS — M7062 Trochanteric bursitis, left hip: Secondary | ICD-10-CM

## 2018-06-11 DIAGNOSIS — Z79899 Other long term (current) drug therapy: Secondary | ICD-10-CM | POA: Diagnosis not present

## 2018-06-11 DIAGNOSIS — M5030 Other cervical disc degeneration, unspecified cervical region: Secondary | ICD-10-CM

## 2018-06-11 DIAGNOSIS — M797 Fibromyalgia: Secondary | ICD-10-CM

## 2018-06-11 DIAGNOSIS — M7061 Trochanteric bursitis, right hip: Secondary | ICD-10-CM

## 2018-06-11 MED ORDER — LIDOCAINE HCL 1 % IJ SOLN
3.0000 mL | INTRAMUSCULAR | Status: AC | PRN
  Administered 2018-06-11: 3 mL

## 2018-06-11 MED ORDER — TRIAMCINOLONE ACETONIDE 40 MG/ML IJ SUSP
60.0000 mg | INTRAMUSCULAR | Status: AC | PRN
  Administered 2018-06-11: 60 mg via INTRA_ARTICULAR

## 2018-06-11 NOTE — Patient Instructions (Addendum)
Standing Labs We placed an order today for your standing lab work.    Please come back and get your standing labs in 2 weeks and every 3 months   We have open lab Monday through Friday from 8:30-11:30 AM and 1:30-4:00 PM  at the office of Dr. Shaili Deveshwar.   You may experience shorter wait times on Monday and Friday afternoons. The office is located at [address], [address], [place], [place] [address] No appointment is necessary.   Labs are drawn by [facility].  You may receive a bill from [facility] for your lab work.  If you wish to have your labs drawn at another location, please call the office 24 hours in advance to send orders.  If you have any questions regarding directions or hours of operation,  please call [phone].   Just as a reminder please drink plenty of water prior to coming for your lab work. Thanks!   

## 2018-06-12 LAB — COMPLETE METABOLIC PANEL WITH GFR
AG Ratio: 2 (calc) (ref 1.0–2.5)
ALT: 33 U/L (ref 9–46)
AST: 23 U/L (ref 10–40)
Albumin: 5 g/dL (ref 3.6–5.1)
Alkaline phosphatase (APISO): 79 U/L (ref 36–130)
BUN: 17 mg/dL (ref 7–25)
CO2: 26 mmol/L (ref 20–32)
CREATININE: 0.99 mg/dL (ref 0.60–1.35)
Calcium: 9.7 mg/dL (ref 8.6–10.3)
Chloride: 103 mmol/L (ref 98–110)
GFR, Est African American: 113 mL/min/{1.73_m2} (ref >=60)
GFR, Est Non African American: 98 mL/min/{1.73_m2} (ref >=60)
GLOBULIN: 2.5 g/dL (ref 1.9–3.7)
Glucose, Bld: 97 mg/dL (ref 65–99)
Potassium: 4.5 mmol/L (ref 3.5–5.3)
Sodium: 139 mmol/L (ref 135–146)
Total Bilirubin: 1.2 mg/dL (ref 0.2–1.2)
Total Protein: 7.5 g/dL (ref 6.1–8.1)

## 2018-06-12 LAB — CBC WITH DIFFERENTIAL/PLATELET
Absolute Monocytes: 506 cells/uL (ref 200–950)
Basophils Absolute: 43 cells/uL (ref 0–200)
Basophils Relative: 0.7 %
Eosinophils Absolute: 153 cells/uL (ref 15–500)
Eosinophils Relative: 2.5 %
HCT: 47.3 % (ref 38.5–50.0)
Hemoglobin: 16.1 g/dL (ref 13.2–17.1)
Lymphs Abs: 2568 cells/uL (ref 850–3900)
MCH: 31.4 pg (ref 27.0–33.0)
MCHC: 34 g/dL (ref 32.0–36.0)
MCV: 92.2 fL (ref 80.0–100.0)
MPV: 9.8 fL (ref 7.5–12.5)
Monocytes Relative: 8.3 %
Neutro Abs: 2830 cells/uL (ref 1500–7800)
Neutrophils Relative %: 46.4 %
PLATELETS: 233 10*3/uL (ref 140–400)
RBC: 5.13 10*6/uL (ref 4.20–5.80)
RDW: 13.1 % (ref 11.0–15.0)
TOTAL LYMPHOCYTE: 42.1 %
WBC: 6.1 10*3/uL (ref 3.8–10.8)

## 2018-06-14 NOTE — Progress Notes (Signed)
CBC and CMP WNL

## 2018-06-29 ENCOUNTER — Other Ambulatory Visit: Payer: Self-pay | Admitting: Rheumatology

## 2018-06-29 NOTE — Telephone Encounter (Signed)
Last Visit: 06/11/18 Next Visit: 09/10/18 Labs: 06/11/18 WNL  Okay to refill per Dr. Corliss Skains

## 2018-07-13 ENCOUNTER — Other Ambulatory Visit: Payer: Self-pay | Admitting: *Deleted

## 2018-07-13 MED ORDER — SECUKINUMAB 150 MG/ML ~~LOC~~ SOAJ: 150.0000 mg | SUBCUTANEOUS | 0 refills | Status: DC

## 2018-07-13 NOTE — Telephone Encounter (Signed)
Refill request received via fax  Last Visit: 06/11/18 Next Visit: 09/10/18 Labs: 06/11/18 WNL TB Gold: 10/12/17 Neg   Okay to refill per Dr. Corliss Skains

## 2018-08-14 ENCOUNTER — Other Ambulatory Visit: Payer: Self-pay | Admitting: Rheumatology

## 2018-08-16 NOTE — Telephone Encounter (Signed)
Last Visit: 06/11/2018 Next Visit: 09/10/2018 Labs: 06/11/2018 CBC and CMP WNL  Okay to refill per Dr. Corliss Skains.

## 2018-08-24 ENCOUNTER — Telehealth: Payer: Self-pay | Admitting: Rheumatology

## 2018-08-24 NOTE — Telephone Encounter (Signed)
Melissa from [employer] called stating authorization is needed for patient's MRI which is scheduled on Thursday, 08/26/18.  Please call back at #[phone] ext [phone]

## 2018-08-26 ENCOUNTER — Ambulatory Visit (HOSPITAL_COMMUNITY)
Admission: RE | Admit: 2018-08-26 | Discharge: 2018-08-26 | Disposition: A | Discharge status: Home / Self Care | Payer: BLUE CROSS/BLUE SHIELD | Source: Ambulatory Visit | Attending: Physician Assistant | Admitting: Physician Assistant

## 2018-08-26 ENCOUNTER — Other Ambulatory Visit: Payer: Self-pay

## 2018-08-26 DIAGNOSIS — M25511 Pain in right shoulder: Secondary | ICD-10-CM | POA: Insufficient documentation

## 2018-08-27 NOTE — Progress Notes (Deleted)
Office Visit Note  Patient: Miguel Evans             Date of Birth: September 13, 1980           MRN: [id number]             PCP: Nonnie Done., MD Referring: Nonnie Done., MD Visit Date: 09/10/2018 Occupation: @GUAROCC @  Subjective:  No chief complaint on file.   History of Present Illness: Miguel Evans is a 38 y.o. male ***   Activities of Daily Living:  Patient reports morning stiffness for *** {minute/hour:19697}.   Patient {ACTIONS;DENIES/REPORTS:21021675::"Denies"} nocturnal pain.  Difficulty dressing/grooming: {ACTIONS;DENIES/REPORTS:21021675::"Denies"} Difficulty climbing stairs: {ACTIONS;DENIES/REPORTS:21021675::"Denies"} Difficulty getting out of chair: {ACTIONS;DENIES/REPORTS:21021675::"Denies"} Difficulty using hands for taps, buttons, cutlery, and/or writing: {ACTIONS;DENIES/REPORTS:21021675::"Denies"}  No Rheumatology ROS completed.   PMFS History:  Patient Active Problem List   Diagnosis Date Noted  . Psoriasis 03/27/2017  . 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 Patellofemoral arthralgia of both knees 02/24/2017  . Hyperuricemia 03/26/2016  . Elevated blood pressure  03/26/2016  . Elevated LFTs 03/26/2016  . Anxiety 03/26/2016  . Psoriatic arthritis (HCC) 03/25/2016  . High risk medication use 03/25/2016  . Cervicalgia 03/25/2016  . Hand pain 03/25/2016  . 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 Anxiety   . Gout   . Hypertension     Family History  Problem Relation Age of Onset  . Cancer Mother        breast   . Bipolar disorder Mother   . Hypertension Father   . Osteoarthritis Father   . Emphysema Maternal Grandfather   . CAD Paternal Grandfather   . Healthy Son   . Healthy Son   . Healthy Daughter   . Healthy Daughter    Past Surgical History:  Procedure Laterality Date  . ANTERIOR CRUCIATE LIGAMENT REPAIR Bilateral Left in 2002, Right 2013  . VASECTOMY     Social History   Social History Narrative  .  Not on file    There is no immunization history on file for this patient.   Objective: Vital Signs: There were no vitals taken for this visit.   Physical Exam   Musculoskeletal Exam: ***  CDAI Exam: CDAI Score: Not documented Patient Global Assessment: Not documented; Provider Global Assessment: Not documented Swollen: Not documented; Tender: Not documented Joint Exam   Not documented   There is currently no information documented on the homunculus. Go to the Rheumatology activity and complete the homunculus joint exam.  Investigation: No additional findings.  Imaging: No results found.  Recent Labs: Lab Results  Component Value Date   WBC 6.1 06/11/2018   HGB 16.1 06/11/2018   PLT 233 06/11/2018   NA 139 06/11/2018   K 4.5 06/11/2018   CL 103 06/11/2018   CO2 26 06/11/2018   GLUCOSE 97 06/11/2018   BUN 17 06/11/2018   CREATININE 0.99 06/11/2018   BILITOT 1.2 06/11/2018   ALKPHOS 74 10/24/2016   AST 23 06/11/2018   ALT 33 06/11/2018   PROT 7.5 06/11/2018   ALBUMIN 4.7 10/24/2016   CALCIUM 9.7 06/11/2018   GFRAA 113 06/11/2018   QFTBGOLDPLUS NEGATIVE 10/12/2017    Speciality Comments: No specialty comments available.  Procedures:  No procedures performed Allergies: Patient has no known allergies.   Assessment / Plan:     Visit Diagnoses: No diagnosis found.   Orders: No orders of the defined types were placed in this encounter.  No orders  of the defined types were placed in this encounter.   Face-to-face time spent with patient was *** minutes. Greater than 50% of time was spent in counseling and coordination of care.  Follow-Up Instructions: No follow-ups on file.   Gearldine Bienenstockaylor M Janisse Ghan, PA-C  Note - This record has been created using Dragon software.  Chart creation errors have been sought, but may not always  have been located. Such creation errors do not reflect on  the standard of medical care.

## 2018-08-30 NOTE — Progress Notes (Signed)
MRI of right shoulder revealed a full thickness tear of the supraspinatus.  Please notify patient and refer to orthopedics for further evaluation and treatment.

## 2018-09-02 ENCOUNTER — Encounter: Payer: Self-pay | Admitting: Orthopedic Surgery

## 2018-09-02 ENCOUNTER — Other Ambulatory Visit: Payer: Self-pay

## 2018-09-02 ENCOUNTER — Ambulatory Visit: Payer: BLUE CROSS/BLUE SHIELD | Admitting: Orthopedic Surgery

## 2018-09-02 VITALS — BP 159/112 | HR 104 | Resp 15 | Ht 75.0 in | Wt 305.0 lb

## 2018-09-02 DIAGNOSIS — S46011A Strain of muscle(s) and tendon(s) of the rotator cuff of right shoulder, initial encounter: Secondary | ICD-10-CM

## 2018-09-02 NOTE — Progress Notes (Signed)
Office Visit Note   Patient: Miguel Evans           Date of Birth: 1981-02-25           MRN: [id number] Visit Date: 09/02/2018              Requested by: Nonnie Done., MD [address] [place], [place] [address] PCP: Nonnie Done., MD   Assessment & Plan: Visit Diagnoses:  1. Traumatic complete tear of right rotator cuff, initial encounter     Plan:  #1: I discussed with him the results of his MRI scan of his right shoulder.  It is apparent with this tear and is stage II there is imperative that we repair this. #2: Discussed with him the fact that surgical repair is indicated. #3: Follow back up on Tuesday to see Dr. Cleophas Dunker for his evaluation and plan for surgery on Thursday after the visit. #4: Also the fact that he is on medications for his rheumatologic problems he is at a point with his medications that surgery could be entertained now.  Follow-Up Instructions: Return in about 5 days (around 09/07/2018).   Orders:  No orders of the defined types were placed in this encounter.  No orders of the defined types were placed in this encounter.     Procedures: No procedures performed   Clinical Data: No additional findings.   Subjective: Chief Complaint  Patient presents with   Right Shoulder - Pain, Injury   HPI Patient states he fell in December 2019 and something "popped" in his right shoulder. Patient states the pain has remained the same since the fall and his shoulder continues to pop and grind. Patient states his pain level is a 7, sometimes even an 8 or 9.  He had seen Dr. Link Snuffer for his rheumatologic problem and had mentioned about the pain in his shoulder and an MRI scan was ordered.  He was then referred by them for evaluation.   Review of Systems  Constitutional: Negative for fatigue.  HENT: Negative for sneezing, sore throat and tinnitus.   Eyes: Negative for pain and itching.  Respiratory: Negative for shortness of breath and wheezing.     Cardiovascular: Negative for chest pain.  Gastrointestinal: Negative for abdominal pain, constipation and diarrhea.  Endocrine: Negative for polyuria.  Genitourinary: Negative for decreased urine volume and urgency.  Musculoskeletal: Negative for gait problem.  Skin: Negative for rash.  Allergic/Immunologic: Positive for immunocompromised state.  Neurological: Negative for dizziness, weakness, numbness and headaches.  Hematological: Does not bruise/bleed easily.  Psychiatric/Behavioral: Negative for confusion. The patient is not nervous/anxious.      Objective: Vital Signs: BP (!) 159/112 (BP Location: Left Wrist, Patient Position: Sitting, Cuff Size: Normal)    Pulse (!) 104    Resp 15    Ht  (1.905 m)    Wt (!) 305 lb (138.3 kg)    BMI 38.12 kg/m   Physical Exam Constitutional:      Appearance: He is well-developed.  Eyes:     Pupils: Pupils are equal, round, and reactive to light.  Pulmonary:     Effort: Pulmonary effort is normal.  Skin:    General: Skin is warm and dry.  Neurological:     Mental Status: He is alert and oriented to person, place, and time.  Psychiatric:        Behavior: Behavior normal.     Ortho Exam  He is tender to palpation the anterior portion of  his shoulder.  His motion passively is good.  Specialty Comments:  No specialty comments available.  Imaging: Mr Shoulder Right Wo Contrast  Result Date: 08/27/2018 CLINICAL DATA:  Right shoulder pain with painful range of motion for 6 months. EXAM: MRI OF THE RIGHT SHOULDER WITHOUT CONTRAST TECHNIQUE: Multiplanar, multisequence MR imaging of the shoulder was performed. No intravenous contrast was administered. COMPARISON:  None. FINDINGS: Rotator cuff: There is an 11 x 19 mm full-thickness tear of the anterior aspect of the distal supraspinatus tendon. Remainder of the rotator cuff is intact. There is a small intramuscular ganglion cyst at the musculotendinous junction of the infraspinatus, best  seen on image 5 of series 7. Muscles:  No atrophy. Biceps long head:  Properly located and intact. Acromioclavicular Joint: Mild AC joint arthropathy. Type 2 acromion. Glenohumeral Joint: Minimal nonspecific effusion. No chondral defect. Minimal cystic degenerative changes of the posterior aspect of the glenoid seen best on series 4. Labrum:  Intact. Bones:  No marrow abnormality, fracture or dislocation. Other: None IMPRESSION: 1. 11 x 19 mm full-thickness tear of the anterior aspect of the distal supraspinatus tendon. 2. Mild AC joint arthropathy. Electronically Signed   By: Francene BoyersJames  Maxwell M.D.   On: 08/27/2018 16:10    PMFS History: Current Outpatient Medications  Medication Sig Dispense Refill   allopurinol (ZYLOPRIM) 300 MG tablet TAKE 1 TABLET DAILY 90 tablet 0   ALPRAZolam (XANAX) 0.5 MG tablet Take 0.5 mg by mouth at bedtime as needed for anxiety.     amLODipine (NORVASC) 5 MG tablet Take 5 mg by mouth daily.     DULoxetine (CYMBALTA) 30 MG capsule Take 30 mg by mouth 2 (two) times daily.      folic acid (FOLVITE) 1 MG tablet Take 2 mg by mouth daily.     glucosamine-chondroitin 500-400 MG tablet Take 1 tablet by mouth daily.     lisinopril (PRINIVIL,ZESTRIL) 20 MG tablet Take 20 mg by mouth daily.      methotrexate 50 MG/2ML injection Inject 0.4 mLs (10 mg total) into the skin once a week. (Patient taking differently: Inject 20 mg into the skin once a week. 0.8 mL weekly) 6 mL 0   MITIGARE 0.6 MG CAPS Take 1 capsule by mouth once daily 60 capsule 0   Multiple Vitamin (MULTIVITAMIN) capsule Take 1 capsule by mouth daily.     Omega-3 Fatty Acids (FISH OIL) 1000 MG CAPS Take by mouth.     Secukinumab 150 MG/ML SOAJ Inject 150 mg into the skin every 14 (fourteen) days. 6 pen 0   No current facility-administered medications for this visit.     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   Past Medical History:  Diagnosis Date   Anxiety    Gout    Hypertension     Family History  Problem Relation Age of Onset   Cancer Mother        breast    Bipolar disorder Mother    Hypertension Father    Osteoarthritis Father    Emphysema Maternal Grandfather    CAD Paternal Grandfather  Healthy Son    Healthy Son    Healthy Daughter    Healthy Daughter     Past Surgical History:  Procedure Laterality Date   ANTERIOR CRUCIATE LIGAMENT REPAIR Bilateral Left in 2002, Right 2013   VASECTOMY     Social History   Occupational History   Not on file  Tobacco Use   Smoking status: Former Smoker    Packs/day: 0.25    Years: 5.00    Pack years: 1.25    Types: Cigarettes    Last attempt to quit: 03/27/2012    Years since quitting: 6.4   Smokeless tobacco: Former Neurosurgeon  Substance and Sexual Activity   Alcohol use: Yes    Alcohol/week: 2.0 standard drinks    Types: 2 Cans of beer per week   Drug use: No   Sexual activity: Yes    Birth control/protection: Condom

## 2018-09-07 ENCOUNTER — Ambulatory Visit: Payer: BLUE CROSS/BLUE SHIELD | Admitting: Orthopaedic Surgery

## 2018-09-07 ENCOUNTER — Encounter: Payer: Self-pay | Admitting: Orthopaedic Surgery

## 2018-09-07 ENCOUNTER — Other Ambulatory Visit: Payer: Self-pay

## 2018-09-07 VITALS — BP 149/99 | HR 91 | Ht 75.0 in | Wt 305.0 lb

## 2018-09-07 DIAGNOSIS — M25511 Pain in right shoulder: Secondary | ICD-10-CM | POA: Diagnosis not present

## 2018-09-07 NOTE — Progress Notes (Signed)
Office Visit Note   Patient: Miguel Evans           Date of Birth: Jun 28, 1980           MRN: [id number] Visit Date: 09/07/2018              Requested by: Nonnie Done., MD [address] [place], [place] [address] PCP: Nonnie Done., MD   Assessment & Plan: Visit Diagnoses:  1. Acute pain of right shoulder     Plan: Traumatic rotator cuff tear right shoulder with a 11 x 19 mm full-thickness tear of the anterior aspect of the distal supraspinatus.  The remainder of the rotator cuff is intact.  There were mild degenerative changes at the [facility] joint.  No chondral defects in the glenohumeral joint discussion with Miguel Evans regarding rotator cuff tear.  He like to proceed with surgery.  I have discussed the outpatient nature and the arthroscopic procedure as well as the mini open rotator cuff tear repair.  We have discussed the expected pain, nerve block, sling and need for postop physical therapy.  Also have discussed potential incomplete pain relief and potential for re-tear.  He like to proceed.  Follow-Up Instructions: Return will schedule surgery for RCTrepair.   Orders:  No orders of the defined types were placed in this encounter.  No orders of the defined types were placed in this encounter.     Procedures: No procedures performed   Clinical Data: No additional findings.   Subjective: Chief Complaint  Patient presents with  . Right Shoulder - Pain  Patient presents today for right shoulder follow up. He was here five days ago and saw Arlys John and went over his MRI. He is back today to discuss treatment and surgery with Dr.Merel Santoli. Not taking anything for pain. No changes in his symptoms.   HPI  Review of Systems  Constitutional: Negative for fatigue.  HENT: Negative for ear pain.   Eyes: Negative for pain.  Respiratory: Negative for shortness of breath.   Cardiovascular: Negative for leg swelling.  Gastrointestinal: Negative for constipation and diarrhea.   Endocrine: Negative for cold intolerance and heat intolerance.  Genitourinary: Negative for difficulty urinating.  Musculoskeletal: Positive for joint swelling.  Skin: Negative for rash.  Allergic/Immunologic: Negative for food allergies.  Neurological: Positive for weakness.  Hematological: Does not bruise/bleed easily.  Psychiatric/Behavioral: Negative for sleep disturbance.     Objective: Vital Signs: BP (!) 149/99   Pulse 91   Ht 6\' 3"  (1.905 m)   Wt (!) 305 lb (138.3 kg)   BMI 38.12 kg/m   Physical Exam Constitutional:      Appearance: He is well-developed.  Eyes:     Pupils: Pupils are equal, round, and reactive to light.  Pulmonary:     Effort: Pulmonary effort is normal.  Skin:    General: Skin is warm and dry.  Neurological:     Mental Status: He is alert and oriented to person, place, and time.  Psychiatric:        Behavior: Behavior normal.     Ortho Exam awake alert and oriented x3.  Positive empty can and impingement testing right shoulder.  Has local tenderness over the acromioclavicular joint and the anterior subacromial region.  Full overhead motion actively.  Good strength. Specialty Comments:  No specialty comments available.  Imaging: No results found.   PMFS History: Patient Active Problem List   Diagnosis Date Noted  . Psoriasis 03/27/2017  . 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  Patellofemoral arthralgia of both knees 02/24/2017  . Hyperuricemia 03/26/2016  . Elevated blood pressure  03/26/2016  . Elevated LFTs 03/26/2016  . Anxiety 03/26/2016  . Psoriatic arthritis (HCC) 03/25/2016  . High risk medication use 03/25/2016  . Cervicalgia 03/25/2016  . Hand pain 03/25/2016  . 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 Anxiety   . Gout   . Hypertension     Family History  Problem Relation Age of Onset  . Cancer Mother        breast   . Bipolar disorder Mother   . Hypertension Father   .  Osteoarthritis Father   . Emphysema Maternal Grandfather   . CAD Paternal Grandfather   . Healthy Son   . Healthy Son   . Healthy Daughter   . Healthy Daughter     Past Surgical History:  Procedure Laterality Date  . ANTERIOR CRUCIATE LIGAMENT REPAIR Bilateral Left in 2002, Right 2013  . VASECTOMY     Social History   Occupational History  . Not on file  Tobacco Use  . Smoking status: Former Smoker    Packs/day: 0.25    Years: 5.00    Pack years: 1.25    Types: Cigarettes    Last attempt to quit: 03/27/2012    Years since quitting: 6.4  . Smokeless tobacco: Former Engineer, waterUser  Substance and Sexual Activity  . Alcohol use: Yes    Alcohol/week: 2.0 standard drinks    Types: 2 Cans of beer per week  . Drug use: No  . Sexual activity: Yes    Birth control/protection: Condom

## 2018-09-08 ENCOUNTER — Encounter: Payer: Self-pay | Admitting: Orthopaedic Surgery

## 2018-09-10 ENCOUNTER — Ambulatory Visit: Payer: Self-pay | Admitting: Physician Assistant

## 2018-09-10 NOTE — Telephone Encounter (Signed)
Authorization for surgery scheduled 09/16/2018 - [id number] for diagnosis [id number] and [id number].

## 2018-09-14 ENCOUNTER — Inpatient Hospital Stay: Payer: BLUE CROSS/BLUE SHIELD | Admitting: Orthopaedic Surgery

## 2018-09-20 ENCOUNTER — Other Ambulatory Visit (HOSPITAL_COMMUNITY)
Admission: RE | Admit: 2018-09-20 | Discharge: 2018-09-20 | Disposition: A | Discharge status: Home / Self Care | Payer: BC Managed Care – PPO | Source: Ambulatory Visit | Attending: Orthopaedic Surgery | Admitting: Orthopaedic Surgery

## 2018-09-20 DIAGNOSIS — Z1159 Encounter for screening for other viral diseases: Secondary | ICD-10-CM | POA: Insufficient documentation

## 2018-09-21 LAB — NOVEL CORONAVIRUS, NAA (HOSP ORDER, SEND-OUT TO REF LAB; TAT 18-24 HRS): SARS-CoV-2, NAA: NOT DETECTED

## 2018-09-21 NOTE — Patient Instructions (Addendum)
Mittie Bododam [place]     Your procedure is scheduled on: 09-23-2018   [facility]  Entrance Report to admitting at  1030 AM     Call this number if you have problems the morning of surgery [phone]    Remember: Do not eat food After Midnight.    Please drink ensure at 4:30 AM   BRUSH YOUR TEETH MORNING OF SURGERY AND RINSE YOUR MOUTH OUT, NO CHEWING GUM CANDY OR MINTS.     Take these medicines the morning of surgery with A SIP OF WATER       BRING MASK AND TUBING TO HOSPITAL WITH YOU            : DULOXETINE (CYMBALTA), AMLODIPINE (NORVASC), ALLOPURINOL, MITIGARE                                You may not have any metal on your body including              piercings             Do not wear jewelry, , lotions, powders or  deodorant         .              Men may shave face and neck.   Do not bring valuables to the hospital. [facility] IS NOT             RESPONSIBLE   FOR VALUABLES.  Contacts, dentures or bridgework may not be worn into surgery.      Patients discharged the day of surgery will not be allowed to drive home . IF YOU ARE HAVING SURGERY AND GOING HOME THE SAME DAY, YOU MUST HAVE AN ADULT TO DRIVE YOU HOME AND BE WITH YOU FOR 24 HOURS.  YOU MAY GO HOME BY TAXI OR UBER OR ORTHERWISE, BUT AN ADULT MUST ACCOMPANY YOU HOME AND STAY WITH YOU FOR 24 HOURS.   Name and phone number of your driver:   Special Instructions: N/A              Please read over the following fact sheets you were given: _____________________________________________________________________             [facility] - Pre[facility]  Before surgery, you can play an important role.   Because skin is not sterile, your skin needs to be as free of germs as possible.   You can reduce the number of germs on your skin by washing with CHG (chlorahexidine gluconate) soap before surgery.   CHG is an antiseptic cleaner which kills germs and bonds with the skin to  continue killing germs even after washing. Please DO NOT use if you have an allergy to CHG or antibacterial soaps.   If your skin becomes reddened/irritated stop using the CHG and inform your nurse when you arrive at Short Stay. Do not shave (including legs and underarms) for at least 48 hours prior to the first CHG shower.  You may shave your face/neck.  Please follow these instructions carefully:   1.  Shower with CHG Soap the night before surgery and the  morning of Surgery.  2.  If you choose to wash your hair, wash your hair first as usual with your  normal  shampoo.  3.  After you shampoo,  rinse your hair and body thoroughly to remove the  shampoo.                                         4.  Use CHG as you would any other liquid soap.  You can apply chg directly  to the skin and wash                       Gently with a scrungie or clean washcloth.  5.  Apply the CHG Soap to your body ONLY FROM THE NECK DOWN.   Do not use on face/ open                           Wound or open sores. Avoid contact with eyes, ears mouth and genitals (private parts).                       Wash face,  Genitals (private parts) with your normal soap.             6.  Wash thoroughly, paying special attention to the area where your surgery  will be performed.  7.  Thoroughly rinse your body with warm water from the neck down.  8.  DO NOT shower/wash with your normal soap after using and rinsing off  the CHG Soap.                9.  Pat yourself dry with a clean towel.            10.  Wear clean pajamas.            11.  Place clean sheets on your bed the night of your first shower and do not  sleep with pets.  Day of Surgery : Do not apply any lotions/deodorants the morning of surgery.  Please wear clean clothes to the hospital/surgery center.    FAILURE TO FOLLOW THESE INSTRUCTIONS MAY RESULT IN THE CANCELLATION OF YOUR SURGERY PATIENT SIGNATURE_________________________________  NURSE  SIGNATURE__________________________________  ________________________________________________________________________

## 2018-09-21 NOTE — H&P (Signed)
Norlene CampbellPeter Whitfield, MD   Jacqualine CodeBrian Issaiah Seabrooks, PA-C [address], [place], [place]  [id number]                             [phone]   ORTHOPAEDIC HISTORY & PHYSICAL  Miguel Evans MRN:  [id number] DOB/SEX:  10/19/1980/male  CHIEF COMPLAINT:  Painful right shoulder  HISTORY: Patient states he fell in December 2019 and something "popped" in his right shoulder. Patient states the pain has remained the same since the fall and his shoulder continues to pop and grind. Patient states his pain level is a 7, sometimes even an 8 or 9.  He had seen Dr. Link Snuffereveschwar for his rheumatologic problem and had mentioned about the pain in his shoulder and an MRI scan was ordered.    FINDINGS: Rotator cuff: There is an 11 x 19 mm full-thickness tear of the anterior aspect of the distal supraspinatus tendon. Remainder of the rotator cuff is intact. There is a small intramuscular ganglion cyst at the musculotendinous junction of the infraspinatus, best seen on image 5 of series 7. Muscles:  No atrophy. Biceps long head:  Properly located and intact. Acromioclavicular Joint: Mild AC joint arthropathy. Type 2 acromion. Glenohumeral Joint: Minimal nonspecific effusion. No chondral defect. Minimal cystic degenerative changes of the posterior aspect of the glenoid seen best on series 4. Labrum:  Intact. Bones:  No marrow abnormality, fracture or dislocation. Other: None IMPRESSION: 1. 11 x 19 mm full-thickness tear of the anterior aspect of the distal supraspinatus tendon. 2. Mild AC joint arthropathy. Electronically Signed   By: Francene BoyersJames  Maxwell M.D.   On: 08/27/2018 16:10       PAST MEDICAL HISTORY: Patient Active Problem List   Diagnosis Date Noted  . Psoriasis 03/27/2017  . 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 Patellofemoral arthralgia of both knees 02/24/2017  . Hyperuricemia 03/26/2016  . Elevated blood pressure  03/26/2016  . Elevated LFTs 03/26/2016  . Anxiety 03/26/2016  . Psoriatic arthritis (HCC) 03/25/2016  . High  risk medication use 03/25/2016  . Cervicalgia 03/25/2016  . Hand pain 03/25/2016  . 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 Anxiety   . Gout   . Hypertension    Past Surgical History:  Procedure Laterality Date  . ANTERIOR CRUCIATE LIGAMENT REPAIR Bilateral Left in 2002, Right 2013  . VASECTOMY       MEDICATIONS:   No medications prior to admission.   No current facility-administered medications for this encounter.    Current Outpatient Medications  Medication Sig Dispense Refill  . allopurinol (ZYLOPRIM) 300 MG tablet TAKE 1 TABLET DAILY (Patient taking differently: Take 300 mg by mouth daily. ) 90 tablet 0  . ALPRAZolam (XANAX) 0.5 MG tablet Take 0.5 mg by mouth at bedtime as needed for anxiety.    Marland Kitchen. amLODipine (NORVASC) 5 MG tablet Take 5 mg by mouth daily.    . DULoxetine (CYMBALTA) 30 MG capsule Take 30 mg by mouth 2 (two) times daily.     Marland Kitchen. glucosamine-chondroitin 500-400 MG tablet Take 1 tablet by mouth daily.    Marland Kitchen. lisinopril (PRINIVIL,ZESTRIL) 20 MG tablet Take 20 mg by mouth daily.     . methotrexate 50 MG/2ML injection Inject 0.4 mLs (10 mg total) into the skin once a week. (Patient taking differently: Inject 20 mg into the skin once a week. 0.8 mL weekly) 6 mL 0  . MITIGARE  0.6 MG CAPS Take 1 capsule by mouth once daily (Patient taking differently: Take 0.6 mg by mouth daily. ) 60 capsule 0  . Multiple Vitamin (MULTIVITAMIN) capsule Take 1 capsule by mouth daily.    . Omega-3 Fatty Acids (FISH OIL) 1000 MG CAPS Take 1,000 mg by mouth daily.     . Secukinumab 150 MG/ML SOAJ Inject 150 mg into the skin every 14 (fourteen) days. 6 pen 0     ALLERGIES:  No Known Allergies  REVIEW OF SYSTEMS:  Review of Systems  Constitutional: Negative for fatigue.  HENT: Negative for ear pain.   Eyes: Negative for pain.  Respiratory: Negative for shortness of breath.   Cardiovascular: Negative for leg swelling.  Gastrointestinal: Negative  for constipation and diarrhea.  Endocrine: Negative for cold intolerance and heat intolerance.  Genitourinary: Negative for difficulty urinating.  Musculoskeletal: Positive for joint swelling.  Skin: Negative for rash.  Allergic/Immunologic: Negative for food allergies.  Neurological: Positive for weakness.  Hematological: Does not bruise/bleed easily.  Psychiatric/Behavioral: Negative for sleep disturbance.    FAMILY HISTORY:   Family History  Problem Relation Age of Onset  . Cancer Mother        breast   . Bipolar disorder Mother   . Hypertension Father   . Osteoarthritis Father   . Emphysema Maternal Grandfather   . CAD Paternal Grandfather   . Healthy Son   . Healthy Son   . Healthy Daughter   . Healthy Daughter     SOCIAL HISTORY:   Social History   Tobacco Use  . Smoking status: Former Smoker    Packs/day: 0.25    Years: 5.00    Pack years: 1.25    Types: Cigarettes    Last attempt to quit: 03/27/2012    Years since quitting: 6.4  . Smokeless tobacco: Former Network engineer Use Topics  . Alcohol use: Yes    Alcohol/week: 2.0 standard drinks    Types: 2 Cans of beer per week      EXAMINATION: Vital signs in last 24 hours:    Head is normocephalic.   Eyes:  Pupils equal, round and reactive to light and accommodation.  Extraocular intact. ENT: Ears, nose, and throat were benign.   Neck: supple, no bruits were noted.   Chest: good expansion.   Lungs: essentially clear.   Cardiac: regular rhythm and rate, normal S1, S2.  No murmurs appreciated. Pulses :  2+ bilateral and symmetric in bilateral extremities. Abdomen is scaphoid, soft, nontender, no masses palpable, normal bowel sounds                  present. CNS:  He is oriented x3 and cranial nerves II-XII grossly intact. Breast, rectal, and genital exams: not performed and not indicated for an orthopedic evaluation. Musculoskeletal: Positive empty can and impingement testing right shoulder.  Has local  tenderness over the acromioclavicular joint and the anterior subacromial region.  Full overhead motion actively.  Good strength.   Imaging Review 1. 11 x 19 mm full-thickness tear of the anterior aspect of the distal supraspinatus tendon. 2. Mild AC joint arthropathy.   ASSESSMENT: 1. 11 x 19 mm full-thickness tear of the anterior aspect of the distal supraspinatus tendon. 2. Mild AC joint arthropathy.   Past Medical History:  Diagnosis Date  . Anxiety   . Gout   . Hypertension     PLAN: Plan for IMPRESSION: 1. 11 x 19 mm full-thickness tear of the anterior aspect of  the distal supraspinatus tendon. 2. Mild AC joint arthropathy.   The procedure,  risks, and benefits of surgery were presented and reviewed. The risks including but not limited to infection, blood clots, vascular and nerve injury, stiffness,  among others were discussed. The patient acknowledged the explanation, agreed to proceed.   Miguel DroneBrian D. Aleda Granaetrarca, PA-C [phone]   09/21/2018 12:17 PM

## 2018-09-22 ENCOUNTER — Encounter (HOSPITAL_COMMUNITY)
Admission: RE | Admit: 2018-09-22 | Payer: BLUE CROSS/BLUE SHIELD | Source: Ambulatory Visit | Attending: Surgery | Admitting: Surgery

## 2018-09-22 ENCOUNTER — Other Ambulatory Visit: Payer: Self-pay

## 2018-09-22 ENCOUNTER — Encounter (HOSPITAL_COMMUNITY)
Admission: RE | Admit: 2018-09-22 | Discharge: 2018-09-22 | Disposition: A | Discharge status: Home / Self Care | Payer: BC Managed Care – PPO | Source: Ambulatory Visit | Attending: Orthopaedic Surgery | Admitting: Orthopaedic Surgery

## 2018-09-22 ENCOUNTER — Encounter (HOSPITAL_COMMUNITY): Payer: Self-pay

## 2018-09-22 DIAGNOSIS — M7541 Impingement syndrome of right shoulder: Secondary | ICD-10-CM | POA: Insufficient documentation

## 2018-09-22 DIAGNOSIS — M19011 Primary osteoarthritis, right shoulder: Secondary | ICD-10-CM | POA: Insufficient documentation

## 2018-09-22 DIAGNOSIS — Z01818 Encounter for other preprocedural examination: Secondary | ICD-10-CM | POA: Diagnosis not present

## 2018-09-22 DIAGNOSIS — M75101 Unspecified rotator cuff tear or rupture of right shoulder, not specified as traumatic: Secondary | ICD-10-CM | POA: Insufficient documentation

## 2018-09-22 DIAGNOSIS — I10 Essential (primary) hypertension: Secondary | ICD-10-CM | POA: Diagnosis not present

## 2018-09-22 DIAGNOSIS — R9431 Abnormal electrocardiogram [ECG] [EKG]: Secondary | ICD-10-CM | POA: Diagnosis not present

## 2018-09-22 LAB — CBC WITH DIFFERENTIAL/PLATELET
Abs Immature Granulocytes: 0.02 10*3/uL (ref 0.00–0.07)
Basophils Absolute: 0 10*3/uL (ref 0.0–0.1)
Basophils Relative: 1 %
Eosinophils Absolute: 0.2 10*3/uL (ref 0.0–0.5)
Eosinophils Relative: 3 %
HCT: 45.3 % (ref 39.0–52.0)
Hemoglobin: 15.2 g/dL (ref 13.0–17.0)
Immature Granulocytes: 0 %
Lymphocytes Relative: 38 %
Lymphs Abs: 2.3 10*3/uL (ref 0.7–4.0)
MCH: 32.3 pg (ref 26.0–34.0)
MCHC: 33.6 g/dL (ref 30.0–36.0)
MCV: 96.4 fL (ref 80.0–100.0)
Monocytes Absolute: 0.5 10*3/uL (ref 0.1–1.0)
Monocytes Relative: 8 %
Neutro Abs: 3 10*3/uL (ref 1.7–7.7)
Neutrophils Relative %: 50 %
Platelets: 188 10*3/uL (ref 150–400)
RBC: 4.7 MIL/uL (ref 4.22–5.81)
RDW: 12.2 % (ref 11.5–15.5)
WBC: 6 10*3/uL (ref 4.0–10.5)
nRBC: 0 % (ref 0.0–0.2)

## 2018-09-22 LAB — URINALYSIS, ROUTINE W REFLEX MICROSCOPIC
Bilirubin Urine: NEGATIVE
Glucose, UA: NEGATIVE mg/dL
Hgb urine dipstick: NEGATIVE
Ketones, ur: NEGATIVE mg/dL
Leukocytes,Ua: NEGATIVE
Nitrite: NEGATIVE
Protein, ur: NEGATIVE mg/dL
Specific Gravity, Urine: 1.012 (ref 1.005–1.030)
pH: 6 (ref 5.0–8.0)

## 2018-09-22 LAB — BASIC METABOLIC PANEL
Anion gap: 9 (ref 5–15)
BUN: 14 mg/dL (ref 6–20)
CO2: 24 mmol/L (ref 22–32)
Calcium: 9 mg/dL (ref 8.9–10.3)
Chloride: 104 mmol/L (ref 98–111)
Creatinine, Ser: 0.84 mg/dL (ref 0.61–1.24)
GFR calc Af Amer: >60 mL/min (ref >60)
GFR calc non Af Amer: >60 mL/min (ref >60)
Glucose, Bld: 112 mg/dL — ABNORMAL HIGH (ref 70–99)
Potassium: 4.1 mmol/L (ref 3.5–5.1)
Sodium: 137 mmol/L (ref 135–145)

## 2018-09-22 LAB — PROTIME-INR
INR: 0.9 (ref 0.8–1.2)
Prothrombin Time: 12.5 seconds (ref 11.4–15.2)

## 2018-09-22 LAB — APTT: aPTT: 27 seconds (ref 24–36)

## 2018-09-22 LAB — SURGICAL PCR SCREEN
MRSA, PCR: NEGATIVE
Staphylococcus aureus: NEGATIVE

## 2018-09-22 MED ORDER — DEXTROSE 5 % IV SOLN
3.0000 g | INTRAVENOUS | Status: AC
  Administered 2018-09-23: 13:00:00 3 g via INTRAVENOUS
  Filled 2018-09-22: qty 3

## 2018-09-22 MED ORDER — CEFAZOLIN SODIUM-DEXTROSE 2-4 GM/100ML-% IV SOLN: 2.0000 g | INTRAVENOUS | Status: DC

## 2018-09-22 MED ORDER — ENSURE PRE-SURGERY PO LIQD
296.0000 mL | Freq: Once | ORAL | Status: DC
  Filled 2018-09-22: qty 296

## 2018-09-22 NOTE — Progress Notes (Signed)
SPOKE W/  Patient      SCREENING SYMPTOMS OF COVID 19:   COUGH-- no  RUNNY NOSE--- no  SORE THROAT--- no  NASAL CONGESTION---- no  SNEEZING---- no  SHORTNESS OF BREATH--- no  DIFFICULTY BREATHING--- no  TEMP >100.0 ----- no  UNEXPLAINED BODY ACHES------ no  CHILLS -------- no  HEADACHES --------- no  LOSS OF SMELL/ TASTE -------- no    HAVE YOU OR ANY FAMILY MEMBER TRAVELLED PAST 14 DAYS OUT OF THE   COUNTY--- no STATE---- no COUNTRY---- no  HAVE YOU OR ANY FAMILY MEMBER BEEN EXPOSED TO ANYONE WITH COVID 19?  No

## 2018-09-23 ENCOUNTER — Ambulatory Visit (HOSPITAL_COMMUNITY)
Admission: RE | Admit: 2018-09-23 | Discharge: 2018-09-23 | Disposition: A | Discharge status: Home / Self Care | Payer: BC Managed Care – PPO | Attending: Orthopaedic Surgery | Admitting: Orthopaedic Surgery

## 2018-09-23 ENCOUNTER — Encounter (HOSPITAL_COMMUNITY)
Admission: RE | Disposition: A | Discharge status: Home / Self Care | Payer: Self-pay | Source: Home / Self Care | Attending: Orthopaedic Surgery

## 2018-09-23 ENCOUNTER — Inpatient Hospital Stay: Payer: BLUE CROSS/BLUE SHIELD | Admitting: Orthopaedic Surgery

## 2018-09-23 ENCOUNTER — Ambulatory Visit (HOSPITAL_COMMUNITY): Payer: BC Managed Care – PPO | Admitting: Anesthesiology

## 2018-09-23 ENCOUNTER — Encounter (HOSPITAL_COMMUNITY): Payer: Self-pay | Admitting: Emergency Medicine

## 2018-09-23 ENCOUNTER — Telehealth (HOSPITAL_COMMUNITY): Payer: Self-pay | Admitting: *Deleted

## 2018-09-23 ENCOUNTER — Other Ambulatory Visit: Payer: Self-pay

## 2018-09-23 ENCOUNTER — Ambulatory Visit (HOSPITAL_COMMUNITY): Payer: BC Managed Care – PPO | Admitting: Physician Assistant

## 2018-09-23 DIAGNOSIS — S46011A Strain of muscle(s) and tendon(s) of the rotator cuff of right shoulder, initial encounter: Secondary | ICD-10-CM | POA: Diagnosis present

## 2018-09-23 DIAGNOSIS — Z79899 Other long term (current) drug therapy: Secondary | ICD-10-CM | POA: Insufficient documentation

## 2018-09-23 DIAGNOSIS — Z87891 Personal history of nicotine dependence: Secondary | ICD-10-CM | POA: Diagnosis not present

## 2018-09-23 DIAGNOSIS — L4050 Arthropathic psoriasis, unspecified: Secondary | ICD-10-CM | POA: Insufficient documentation

## 2018-09-23 DIAGNOSIS — I10 Essential (primary) hypertension: Secondary | ICD-10-CM | POA: Insufficient documentation

## 2018-09-23 DIAGNOSIS — M75121 Complete rotator cuff tear or rupture of right shoulder, not specified as traumatic: Secondary | ICD-10-CM | POA: Diagnosis not present

## 2018-09-23 DIAGNOSIS — Z6839 Body mass index (BMI) 39.0-39.9, adult: Secondary | ICD-10-CM | POA: Insufficient documentation

## 2018-09-23 DIAGNOSIS — W19XXXA Unspecified fall, initial encounter: Secondary | ICD-10-CM | POA: Insufficient documentation

## 2018-09-23 DIAGNOSIS — M7541 Impingement syndrome of right shoulder: Secondary | ICD-10-CM | POA: Insufficient documentation

## 2018-09-23 DIAGNOSIS — F419 Anxiety disorder, unspecified: Secondary | ICD-10-CM | POA: Diagnosis not present

## 2018-09-23 DIAGNOSIS — M75101 Unspecified rotator cuff tear or rupture of right shoulder, not specified as traumatic: Secondary | ICD-10-CM | POA: Diagnosis present

## 2018-09-23 HISTORY — PX: SHOULDER ARTHROSCOPY WITH SUBACROMIAL DECOMPRESSION AND OPEN ROTATOR C: SHX5688

## 2018-09-23 SURGERY — SHOULDER ARTHROSCOPY WITH SUBACROMIAL DECOMPRESSION AND OPEN ROTATOR CUFF REPAIR, OPEN BICEPS TENDON REPAIR
Anesthesia: General | Site: Shoulder | Laterality: Right

## 2018-09-23 MED ORDER — PROMETHAZINE HCL 25 MG/ML IJ SOLN: 6.2500 mg | INTRAMUSCULAR | Status: DC | PRN

## 2018-09-23 MED ORDER — PROPOFOL 10 MG/ML IV BOLUS
INTRAVENOUS | Status: DC | PRN
  Administered 2018-09-23: 350 mg via INTRAVENOUS

## 2018-09-23 MED ORDER — ACETAMINOPHEN 10 MG/ML IV SOLN
1000.0000 mg | Freq: Once | INTRAVENOUS | Status: AC
  Administered 2018-09-23: 13:00:00 1000 mg via INTRAVENOUS
  Filled 2018-09-23: qty 100

## 2018-09-23 MED ORDER — CHLORHEXIDINE GLUCONATE 4 % EX LIQD: 60.0000 mL | Freq: Once | CUTANEOUS | Status: DC

## 2018-09-23 MED ORDER — LACTATED RINGERS IV SOLN
INTRAVENOUS | Status: DC
  Administered 2018-09-23 (×2): via INTRAVENOUS

## 2018-09-23 MED ORDER — PROPOFOL 10 MG/ML IV BOLUS
INTRAVENOUS | Status: AC
  Filled 2018-09-23: qty 20

## 2018-09-23 MED ORDER — MIDAZOLAM HCL 2 MG/2ML IJ SOLN
1.0000 mg | Freq: Once | INTRAMUSCULAR | Status: AC
  Administered 2018-09-23: 2 mg via INTRAVENOUS
  Filled 2018-09-23: qty 2

## 2018-09-23 MED ORDER — BUPIVACAINE LIPOSOME 1.3 % IJ SUSP
INTRAMUSCULAR | Status: DC | PRN
  Administered 2018-09-23: 10 mL via PERINEURAL

## 2018-09-23 MED ORDER — BUPIVACAINE-EPINEPHRINE (PF) 0.25% -1:200000 IJ SOLN
INTRAMUSCULAR | Status: AC
  Filled 2018-09-23: qty 30

## 2018-09-23 MED ORDER — SODIUM CHLORIDE 0.9 % IR SOLN
Status: DC | PRN
  Administered 2018-09-23: 1000 mL

## 2018-09-23 MED ORDER — HYDROMORPHONE HCL 1 MG/ML IJ SOLN: 0.2500 mg | INTRAMUSCULAR | Status: DC | PRN

## 2018-09-23 MED ORDER — POVIDONE-IODINE 10 % EX SWAB
2.0000 "application " | Freq: Once | CUTANEOUS | Status: AC
  Administered 2018-09-23: 2 via TOPICAL

## 2018-09-23 MED ORDER — SUGAMMADEX SODIUM 200 MG/2ML IV SOLN
INTRAVENOUS | Status: DC | PRN
  Administered 2018-09-23: 300 mg via INTRAVENOUS

## 2018-09-23 MED ORDER — BUPIVACAINE HCL (PF) 0.5 % IJ SOLN
INTRAMUSCULAR | Status: DC | PRN
  Administered 2018-09-23: 15 mL via PERINEURAL

## 2018-09-23 MED ORDER — MIDAZOLAM HCL 2 MG/2ML IJ SOLN: 0.5000 mg | Freq: Once | INTRAMUSCULAR | Status: DC | PRN

## 2018-09-23 MED ORDER — SUGAMMADEX SODIUM 500 MG/5ML IV SOLN
INTRAVENOUS | Status: AC
  Filled 2018-09-23: qty 10

## 2018-09-23 MED ORDER — BUPIVACAINE-EPINEPHRINE 0.25% -1:200000 IJ SOLN
INTRAMUSCULAR | Status: DC | PRN
  Administered 2018-09-23: 15 mL

## 2018-09-23 MED ORDER — OXYCODONE HCL 5 MG PO CAPS: 5.0000 mg | ORAL_CAPSULE | ORAL | 0 refills | Status: DC | PRN

## 2018-09-23 MED ORDER — ROCURONIUM BROMIDE 10 MG/ML (PF) SYRINGE
PREFILLED_SYRINGE | INTRAVENOUS | Status: DC | PRN
  Administered 2018-09-23: 80 mg via INTRAVENOUS

## 2018-09-23 MED ORDER — SUGAMMADEX SODIUM 200 MG/2ML IV SOLN
INTRAVENOUS | Status: AC
  Filled 2018-09-23: qty 6

## 2018-09-23 MED ORDER — FENTANYL CITRATE (PF) 100 MCG/2ML IJ SOLN
50.0000 ug | Freq: Once | INTRAMUSCULAR | Status: AC
  Administered 2018-09-23: 100 ug via INTRAVENOUS
  Filled 2018-09-23: qty 2

## 2018-09-23 MED ORDER — FENTANYL CITRATE (PF) 100 MCG/2ML IJ SOLN
INTRAMUSCULAR | Status: DC | PRN
  Administered 2018-09-23 (×2): 100 ug via INTRAVENOUS
  Administered 2018-09-23: 50 ug via INTRAVENOUS

## 2018-09-23 MED ORDER — MEPERIDINE HCL 50 MG/ML IJ SOLN: 6.2500 mg | INTRAMUSCULAR | Status: DC | PRN

## 2018-09-23 MED ORDER — SODIUM CHLORIDE 0.9 % IV SOLN: INTRAVENOUS | Status: DC

## 2018-09-23 MED ORDER — DEXAMETHASONE SODIUM PHOSPHATE 10 MG/ML IJ SOLN
INTRAMUSCULAR | Status: DC | PRN
  Administered 2018-09-23: 5 mg via INTRAVENOUS

## 2018-09-23 MED ORDER — ONDANSETRON HCL 4 MG/2ML IJ SOLN
INTRAMUSCULAR | Status: DC | PRN
  Administered 2018-09-23: 4 mg via INTRAVENOUS

## 2018-09-23 MED ORDER — DEXMEDETOMIDINE HCL 200 MCG/2ML IV SOLN
INTRAVENOUS | Status: DC | PRN
  Administered 2018-09-23: 12 ug via INTRAVENOUS
  Administered 2018-09-23: 8 ug via INTRAVENOUS
  Administered 2018-09-23: 12 ug via INTRAVENOUS

## 2018-09-23 MED ORDER — FENTANYL CITRATE (PF) 250 MCG/5ML IJ SOLN
INTRAMUSCULAR | Status: AC
  Filled 2018-09-23: qty 5

## 2018-09-23 SURGICAL SUPPLY — 76 items
ANCHOR PEEK ALL THREAD (Anchor) ×2 IMPLANT
ANCHOR SUT QUATTRO KNTLS 4.5 (Anchor) ×2 IMPLANT
ANCHOR SUT QUATTRO X 2  5.5MM (Anchor) IMPLANT
ANCHOR SUT QUATTRO X2 5.5 (Anchor) IMPLANT
BAG DECANTER FOR FLEXI CONT (MISCELLANEOUS) IMPLANT
BENZOIN TINCTURE PRP APPL 2/3 (GAUZE/BANDAGES/DRESSINGS) ×2 IMPLANT
BLADE AVERAGE 25MMX9MM (BLADE)
BLADE AVERAGE 25X9 (BLADE) IMPLANT
BLADE SURG 15 STRL LF DISP TIS (BLADE) IMPLANT
BLADE SURG 15 STRL SS (BLADE)
BURR OVAL 8 FLU 5.0MM X 13CM (MISCELLANEOUS) ×1
BURR OVAL 8 FLU 5.0X13 (MISCELLANEOUS) ×2 IMPLANT
CANNULA ACUFLEX KIT 5X76 (CANNULA) ×2 IMPLANT
CLOSURE STERI-STRIP 1/2X4 (GAUZE/BANDAGES/DRESSINGS) ×1
CLOSURE WOUND 1/2 X4 (GAUZE/BANDAGES/DRESSINGS) ×1
CLSR STERI-STRIP ANTIMIC 1/2X4 (GAUZE/BANDAGES/DRESSINGS) ×1 IMPLANT
COVER WAND RF STERILE (DRAPES) IMPLANT
DECANTER SPIKE VIAL GLASS SM (MISCELLANEOUS) ×2 IMPLANT
DISSECTOR 4.0MM X 13CM (MISCELLANEOUS) ×2 IMPLANT
DRAPE IMP U-DRAPE 54X76 (DRAPES) ×3 IMPLANT
DRAPE SHOULDER BEACH CHAIR (DRAPES) ×3 IMPLANT
DRSG AQUACEL AG ADV 3.5X 4 (GAUZE/BANDAGES/DRESSINGS) ×2 IMPLANT
DRSG AQUACEL AG ADV 3.5X 6 (GAUZE/BANDAGES/DRESSINGS) ×2 IMPLANT
DRSG EMULSION OIL 3X3 NADH (GAUZE/BANDAGES/DRESSINGS) ×6 IMPLANT
DRSG PAD ABDOMINAL 8X10 ST (GAUZE/BANDAGES/DRESSINGS) ×6 IMPLANT
DURAPREP 26ML APPLICATOR (WOUND CARE) ×5 IMPLANT
ELECT NDL TIP 2.8 STRL (NEEDLE) IMPLANT
ELECT NEEDLE TIP 2.8 STRL (NEEDLE) ×3 IMPLANT
ELECT PENCIL ROCKER SW 15FT (MISCELLANEOUS) IMPLANT
ELECT REM PT RETURN 15FT ADLT (MISCELLANEOUS) ×3 IMPLANT
GAUZE 4X4 16PLY RFD (DISPOSABLE) IMPLANT
GAUZE SPONGE 4X4 12PLY STRL (GAUZE/BANDAGES/DRESSINGS) ×3 IMPLANT
GLOVE BIO SURGEON STRL SZ8.5 (GLOVE) ×3 IMPLANT
GLOVE BIOGEL PI IND STRL 8 (GLOVE) ×1 IMPLANT
GLOVE BIOGEL PI IND STRL 8.5 (GLOVE) ×1 IMPLANT
GLOVE BIOGEL PI INDICATOR 8 (GLOVE) ×2
GLOVE BIOGEL PI INDICATOR 8.5 (GLOVE) ×2
GLOVE ECLIPSE 8.0 STRL XLNG CF (GLOVE) ×6 IMPLANT
GOWN STRL REUS W/ TWL LRG LVL3 (GOWN DISPOSABLE) ×1 IMPLANT
GOWN STRL REUS W/ TWL XL LVL3 (GOWN DISPOSABLE) IMPLANT
GOWN STRL REUS W/TWL 2XL LVL3 (GOWN DISPOSABLE) ×3 IMPLANT
GOWN STRL REUS W/TWL LRG LVL3 (GOWN DISPOSABLE) ×2
GOWN STRL REUS W/TWL XL LVL3 (GOWN DISPOSABLE)
KIT BASIN OR (CUSTOM PROCEDURE TRAY) ×3 IMPLANT
MANIFOLD NEPTUNE II (INSTRUMENTS) IMPLANT
NDL 1/2 CIR CATGUT .05X1.09 (NEEDLE) IMPLANT
NDL SCORPION MULTI FIRE (NEEDLE) IMPLANT
NEEDLE 1/2 CIR CATGUT .05X1.09 (NEEDLE) IMPLANT
NEEDLE SCORPION MULTI FIRE (NEEDLE) IMPLANT
NS IRRIG 1000ML POUR BTL (IV SOLUTION) IMPLANT
PACK ARTHROSCOPY DSU (CUSTOM PROCEDURE TRAY) ×3 IMPLANT
PORT APPOLLO RF 90DEGREE MULTI (SURGICAL WAND) ×3 IMPLANT
RESTRAINT HEAD UNIVERSAL NS (MISCELLANEOUS) ×3 IMPLANT
SLING ARM FOAM STRAP LRG (SOFTGOODS) IMPLANT
SLING ARM FOAM STRAP MED (SOFTGOODS) IMPLANT
SLING ARM FOAM STRAP XLG (SOFTGOODS) ×2 IMPLANT
SLING ARM IMMOBILIZER LRG (SOFTGOODS) ×2 IMPLANT
STAPLER VISISTAT 35W (STAPLE) IMPLANT
STRIP CLOSURE SKIN 1/2X4 (GAUZE/BANDAGES/DRESSINGS) ×1 IMPLANT
SUCTION FRAZIER HANDLE 10FR (MISCELLANEOUS)
SUCTION TUBE FRAZIER 10FR DISP (MISCELLANEOUS) IMPLANT
SUT BONE WAX W31G (SUTURE) IMPLANT
SUT ETHILON 3 0 PS 1 (SUTURE) IMPLANT
SUT MNCRL AB 3-0 PS2 18 (SUTURE) ×2 IMPLANT
SUT PROLENE 3 0 PS 2 (SUTURE) IMPLANT
SUT VIC AB 0 CT1 27 (SUTURE)
SUT VIC AB 0 CT1 27XBRD ANBCTR (SUTURE) IMPLANT
SUT VIC AB 2-0 SH 27 (SUTURE) ×2
SUT VIC AB 2-0 SH 27XBRD (SUTURE) IMPLANT
SYR BULB 3OZ (MISCELLANEOUS) IMPLANT
TOWEL SURG RFD BLUE STRL DISP (DISPOSABLE) ×3 IMPLANT
TUBING ARTHROSCOPY IRRIG 16FT (MISCELLANEOUS) ×3 IMPLANT
TUBING CONNECTING 10 (TUBING) ×3 IMPLANT
TUBING CONNECTING 10' (TUBING) ×3
WATER STERILE IRR 1000ML POUR (IV SOLUTION) ×3 IMPLANT
YANKAUER SUCT BULB TIP NO VENT (SUCTIONS) IMPLANT

## 2018-09-23 NOTE — Anesthesia Procedure Notes (Signed)
Procedure Name: Intubation Performed by: Gean Maidens, CRNA Pre-anesthesia Checklist: Patient identified, Emergency Drugs available, Suction available, Patient being monitored and Timeout performed Patient Re-evaluated:Patient Re-evaluated prior to induction Oxygen Delivery Method: Circle system utilized Preoxygenation: Pre-oxygenation with 100% oxygen Induction Type: IV induction Ventilation: Two handed mask ventilation required and Oral airway inserted - appropriate to patient size Laryngoscope Size: Mac and 4 Grade View: Grade I Tube type: Oral Tube size: 7.5 mm Number of attempts: 1 Airway Equipment and Method: Stylet Placement Confirmation: ETT inserted through vocal cords under direct vision,  positive ETCO2 and breath sounds checked- equal and bilateral Secured at: 23 cm Tube secured with: Tape Dental Injury: Teeth and Oropharynx as per pre-operative assessment

## 2018-09-23 NOTE — Anesthesia Postprocedure Evaluation (Signed)
Anesthesia Post Note  Patient: Miguel Evans  Procedure(s) Performed: SHOULDER ARTHROSCOPY WITH SUBACROMIAL DECOMPRESSION AND OPEN ROTATOR CUFF REPAIR, (Right Shoulder)     Patient location during evaluation: PACU Anesthesia Type: General and Regional Level of consciousness: awake and alert, patient cooperative and oriented Pain management: pain level controlled Vital Signs Assessment: post-procedure vital signs reviewed and stable Respiratory status: spontaneous breathing, nonlabored ventilation and respiratory function stable Cardiovascular status: blood pressure returned to baseline and stable Postop Assessment: no apparent nausea or vomiting Anesthetic complications: no    Last Vitals:  Vitals:   09/23/18 1432 09/23/18 1445  BP: 130/81 121/81  Pulse: 79 78  Resp: 15 14  Temp: 36.4 C   SpO2: 98% 100%    Last Pain:  Vitals:   09/23/18 1445  TempSrc:   PainSc: 0-No pain                 Briget Shaheed,E. Kyran Connaughton

## 2018-09-23 NOTE — H&P (Signed)
The recent History & Physical has been reviewed. I have personally examined the patient today. There is no interval change to the documented History & Physical. The patient would like to proceed with the procedure.  Garald Balding 09/23/2018,  12:18 PM

## 2018-09-23 NOTE — Transfer of Care (Signed)
Immediate Anesthesia Transfer of Care Note  Patient: Miguel Evans  Procedure(s) Performed: SHOULDER ARTHROSCOPY WITH SUBACROMIAL DECOMPRESSION AND OPEN ROTATOR CUFF REPAIR, (Right Shoulder)  Patient Location: PACU  Anesthesia Type:General  Level of Consciousness: sedated, patient cooperative and responds to stimulation  Airway & Oxygen Therapy: Patient Spontanous Breathing and Patient connected to face mask oxygen  Post-op Assessment: Report given to RN and Post -op Vital signs reviewed and stable  Post vital signs: Reviewed and stable  Last Vitals:  Vitals Value Taken Time  BP 130/81 09/23/18 1432  Temp    Pulse 77 09/23/18 1434  Resp 15 09/23/18 1434  SpO2 99 % 09/23/18 1434  Vitals shown include unvalidated device data.  Last Pain:  Vitals:   09/23/18 1145  TempSrc:   PainSc: 0-No pain      Patients Stated Pain Goal: 4 (23/53/61 4431)  Complications: No apparent anesthesia complications

## 2018-09-23 NOTE — Progress Notes (Signed)
AssistedDr. Carswell Jackson with right, ultrasound guided, interscalene  block. Side rails up, monitors on throughout procedure. See vital signs in flow sheet. Tolerated Procedure well.  

## 2018-09-23 NOTE — Anesthesia Preprocedure Evaluation (Addendum)
Anesthesia Evaluation  Patient identified by MRN, date of birth, ID band Patient awake    Reviewed: Allergy & Precautions, NPO status , Patient's Chart, lab work & pertinent test results  History of Anesthesia Complications Negative for: history of anesthetic complications  Airway Mallampati: II  TM Distance: >3 FB Neck ROM: Full    Dental  (+) Teeth Intact, Dental Advisory Given   Pulmonary former smoker,    breath sounds clear to auscultation       Cardiovascular hypertension, Pt. on medications (-) angina Rhythm:Regular Rate:Normal     Neuro/Psych Anxiety negative neurological ROS     GI/Hepatic negative GI ROS, Neg liver ROS,   Endo/Other  Morbid obesity  Renal/GU negative Renal ROS     Musculoskeletal   Abdominal (+) + obese,   Peds  Hematology negative hematology ROS (+)   Anesthesia Other Findings   Reproductive/Obstetrics                            Anesthesia Physical Anesthesia Plan  ASA: II  Anesthesia Plan: General   Post-op Pain Management: GA combined w/ Regional for post-op pain   Induction:   PONV Risk Score and Plan: 2 and Ondansetron and Dexamethasone  Airway Management Planned: Oral ETT  Additional Equipment:   Intra-op Plan:   Post-operative Plan: Extubation in OR  Informed Consent: I have reviewed the patients History and Physical, chart, labs and discussed the procedure including the risks, benefits and alternatives for the proposed anesthesia with the patient or authorized representative who has indicated his/her understanding and acceptance.     Dental advisory given  Plan Discussed with: CRNA and Surgeon  Anesthesia Plan Comments: (Plan routine monitors, GETA with interscalene block for post op analgesia)       Anesthesia Quick Evaluation

## 2018-09-23 NOTE — Op Note (Signed)
NAMETONYA, Miguel Evans MEDICAL RECORD [id number] ACCOUNT [id number] DATE OF BIRTH:[birth year] FACILITY: [facility] LOCATION: [facility] [place], MD  OPERATIVE REPORT  DATE OF PROCEDURE:  09/23/2018  PREOPERATIVE DIAGNOSES: 1.  Impingement syndrome, right shoulder. 2.  Rotator cuff tear.  POSTOPERATIVE DIAGNOSES:   1.  Impingement syndrome, right shoulder. 2.  Rotator cuff tear.  PROCEDURE: 1.  Examination of right shoulder under anesthesia. 2.  Diagnostic arthroscopy, right shoulder. 3.  Arthroscopic subacromial decompression. 4.  Mini open rotator cuff tear repair.  SURGEON:  Joni Fears, MD  ASSISTANT:  Biagio Borg, PA-C  ANESTHESIA:  General with supplemental Exparel, interscalene nerve block.  COMPLICATIONS:  None.  HISTORY:  A 38 year old gentleman has evidence of impingement syndrome with significant pain with activities and sleeping.  Because of his poor response to time, exercises, and over-the-counter medicines, we obtained an MRI scan that demonstrated an 11 x  17 mm tear of the anterior supraspinatus with minimal retraction.  Because of his poor response, he is now to have a repair of the rotator cuff tear.  DESCRIPTION OF PROCEDURE:  The patient was met in the holding area and identified the right shoulder as the appropriate operative site and marked it accordingly.  The patient was then transported to room #6.  He did receive a preoperative Exparel interscalene nerve block.  He was carefully placed under general anesthesia while supine on the operating room stretcher and then transferred to the operating table.   Using the shoulder frame, he was placed in the semi-sitting position without difficulty.  The right shoulder, which had been previously marked as the appropriate operative site, was then evaluated under anesthesia without evidence of instability or adhesive capsulitis.  The right shoulder was then prepped with DuraPrep and the base of  the neck circumferentially to the wrist.  Sterile draping was performed.  Timeout was called again.  A marking pen was used to outline the [facility] joint, the coracoid and acromion.  At the point of fingerbreadth posterior and medial to the posterior angle acromion, a small stab wound was made and the arthroscope easily placed into the shoulder joint.  There  was a small effusion.  I did not see any loose bodies.  There was no chondromalacia of the glenoid or the humeral head.  There appeared to be a Buford complex.  The superior labrum was without evidence of a tear.  The biceps was intact.  The  subscapularis was intact.  There was a mild amount of synovitis.  I could visualize the full-thickness tear of the supraspinatus as I could easily visualize the subacromial space.  The arthroscope was then placed in subacromial space posteriorly, the cannula in the subacromial space anteriorly, and a third portal was established in the lateral subacromial space.  I debrided abundant bursal tissue.  There was minimal inflammatory tissue, particularly along the anterior acromion.  This was resected.  I did not violate the AC joint capsule.  There was a thickening of the acromion, and I thought there was  impingement, so I performed a subacromial decompression, removing some of the anterior acromion and CA ligament, but I had a nice space  at that point.  The arthroscopic equipment was then removed.  I made about an inch incision over the anterior aspect of the shoulder.  The patient had an [place] tattoo, and I tried to avoid the scar as much as possible.  I made the incision and carried it down to subcutaneous tissue.  Gross bleeders were  Bovie  coagulated.  A raphe in the deltoid fascia was incised and the subacromial space entered.  A self-retaining retractor was inserted.  The tear of the supraspinatus was identified.  It was U-shaped and about a centimeter long.  There was some atrophy along  the edges.  I  debrided the edges to nice thick tissue and then obtained bleeding bone on the humeral head.  I used a single 5.5 mm PEEK anchor with attached FiberWire to reattach the cuff to its anatomic position and then performed a second row repair  with the Quattro 4.5 mm anchor.  It had a very nice repair and no retraction with motion of the shoulder.  The wound was irrigated with saline solution, and 0 Vicryl was used to close the deltoid raphe, 3-0 Monocryl for the subcu, and then Steri-Strips for the skin.  A sterile bulky dressing was applied followed by an Ace bandage.  PLAN:  Oxycodone for pain.  Office first of the week.  LN/NUANCE  D:09/23/2018 T:09/23/2018 JOB:[id number]

## 2018-09-23 NOTE — Anesthesia Procedure Notes (Signed)
Anesthesia Regional Block: Interscalene brachial plexus block   Pre-Anesthetic Checklist: ,, timeout performed, Correct Patient, Correct Site, Correct Laterality, Correct Procedure, Correct Position, site marked, Risks and benefits discussed,  Surgical consent,  Pre-op evaluation,  At surgeon's request and post-op pain management  Laterality: Right and Upper  Prep: chloraprep       Needles:  Injection technique: Single-shot  Needle Type: Echogenic Stimulator Needle     Needle Length: 9cm  Needle Gauge: 21     Additional Needles:   Procedures:, nerve stimulator,,, ultrasound used (permanent image in chart),,,,   Nerve Stimulator or Paresthesia:  Response: forearm twitch, 0.45 mA, 0.1 ms,   Additional Responses:   Narrative:  Start time: 09/23/2018 12:01 PM End time: 09/23/2018 12:09 PM Injection made incrementally with aspirations every 5 mL.  Performed by: Personally  Anesthesiologist: Annye Asa, MD  Additional Notes: Pt identified in Holding room.  Monitors applied. Working IV access confirmed. Sterile prep R clavicle and neck.  #21ga PNS to forearm twitch at 0.49mA threshold with US guidance.  15cc 0.5% Bupivacaine with 10cc Exparel injected incrementally after negative test dose.  Patient asymptomatic, VSS, no heme aspirated, tolerated well.  Jenita Seashore, MD

## 2018-09-23 NOTE — Op Note (Signed)
PATIENT ID:      Miguel Evans  MRN:     [id number] DOB/AGE:    10/16/1980 / 38 y.o.       OPERATIVE REPORT    DATE OF PROCEDURE:  09/23/2018       PREOPERATIVE DIAGNOSIS:   Impingement right shoulder with rotator cuff tear                                                       Estimated body mass index is 39.51 kg/m as calculated from the following:   Height as of this encounter: 6\' 3"  (1.905 m).   Weight as of this encounter: 143.4 kg.     POSTOPERATIVE DIAGNOSIS:   same                                                                   Estimated body mass index is 39.51 kg/m as calculated from the following:   Height as of this encounter: 6\' 3"  (1.905 m).   Weight as of this encounter: 143.4 kg.     PROCEDURE:  Procedure(s):RIGHT SHOULDER ARTHROSCOPY WITH SUBACROMIAL DECOMPRESSION AND MINI  OPEN ROTATOR CUFF REPAIR     SURGEON:  Joni Fears, MD    ASSISTANT:   Biagio Borg, PA-C   (Present and scrubbed throughout the case, critical for assistance with exposure, retraction, instrumentation, and closure.)          ANESTHESIA: regional and general     DRAINS: none :      TOURNIQUET TIME: * No tourniquets in log *    COMPLICATIONS:  None   CONDITION:  stable  PROCEDURE IN DETAIL: [place] 09/23/2018, 1:54 PM

## 2018-09-23 NOTE — Discharge Instructions (Addendum)
General Anesthesia, Adult, Care After  This sheet gives you information about how to care for yourself after your procedure. Your health care provider may also give you more specific instructions. If you have problems or questions, contact your health care provider.  What can I expect after the procedure?  After the procedure, the following side effects are common:  Pain or discomfort at the IV site.  Nausea.  Vomiting.  Sore throat.  Trouble concentrating.  Feeling cold or chills.  Weak or tired.  Sleepiness and fatigue.  Soreness and body aches. These side effects can affect parts of the body that were not involved in surgery.  Follow these instructions at home:    For at least 24 hours after the procedure:  Have a responsible adult stay with you. It is important to have someone help care for you until you are awake and alert.  Rest as needed.  Do not:  Participate in activities in which you could fall or become injured.  Drive.  Use heavy machinery.  Drink alcohol.  Take sleeping pills or medicines that cause drowsiness.  Make important decisions or sign legal documents.  Take care of children on your own.  Eating and drinking  Follow any instructions from your health care provider about eating or drinking restrictions.  When you feel hungry, start by eating small amounts of foods that are soft and easy to digest (bland), such as toast. Gradually return to your regular diet.  Drink enough fluid to keep your urine pale yellow.  If you vomit, rehydrate by drinking water, juice, or clear broth.  General instructions  If you have sleep apnea, surgery and certain medicines can increase your risk for breathing problems. Follow instructions from your health care provider about wearing your sleep device:  Anytime you are sleeping, including during daytime naps.  While taking prescription pain medicines, sleeping medicines, or medicines that make you drowsy.  Return to your normal activities as told by your health care  provider. Ask your health care provider what activities are safe for you.  Take over-the-counter and prescription medicines only as told by your health care provider.  If you smoke, do not smoke without supervision.  Keep all follow-up visits as told by your health care provider. This is important.  Contact a health care provider if:  You have nausea or vomiting that does not get better with medicine.  You cannot eat or drink without vomiting.  You have pain that does not get better with medicine.  You are unable to pass urine.  You develop a skin rash.  You have a fever.  You have redness around your IV site that gets worse.  Get help right away if:  You have difficulty breathing.  You have chest pain.  You have blood in your urine or stool, or you vomit blood.  Summary  After the procedure, it is common to have a sore throat or nausea. It is also common to feel tired.  Have a responsible adult stay with you for the first 24 hours after general anesthesia. It is important to have someone help care for you until you are awake and alert.  When you feel hungry, start by eating small amounts of foods that are soft and easy to digest (bland), such as toast. Gradually return to your regular diet.  Drink enough fluid to keep your urine pale yellow.  Return to your normal activities as told by your health care provider. Ask your health care   provider what activities are safe for you.  This information is not intended to replace advice given to you by your health care provider. Make sure you discuss any questions you have with your health care provider.  Document Released: 07/07/2000 Document Revised: 11/14/2016 Document Reviewed: 11/14/2016  Elsevier Interactive Patient Education  2019 [employer]

## 2018-09-24 ENCOUNTER — Encounter (HOSPITAL_COMMUNITY): Payer: Self-pay | Admitting: Orthopaedic Surgery

## 2018-09-24 ENCOUNTER — Other Ambulatory Visit: Payer: Self-pay | Admitting: Orthopaedic Surgery

## 2018-09-24 ENCOUNTER — Telehealth: Payer: Self-pay | Admitting: Orthopaedic Surgery

## 2018-09-24 MED ORDER — OXYCODONE HCL 5 MG PO CAPS: 5.0000 mg | ORAL_CAPSULE | ORAL | 0 refills | Status: DC | PRN

## 2018-09-24 NOTE — Telephone Encounter (Signed)
[facility] calling to get modified directions for patients rx for Oxycodone. Cannot fill the way it is written. Please write for 30 tabs 1 every 4 hours. # [phone]

## 2018-09-24 NOTE — Telephone Encounter (Signed)
Spoke with patient and he is aware that Dr.Whitfield has spoke with [facility].

## 2018-09-24 NOTE — Telephone Encounter (Signed)
Patient called stating [facility] wouldn't fill his prescription of Oxycodone the way it is written which is 1-2 capsules every 4 hours.  Patient requested a return call.

## 2018-09-28 ENCOUNTER — Ambulatory Visit (INDEPENDENT_AMBULATORY_CARE_PROVIDER_SITE_OTHER): Payer: BC Managed Care – PPO | Admitting: Orthopaedic Surgery

## 2018-09-28 ENCOUNTER — Encounter: Payer: Self-pay | Admitting: Orthopaedic Surgery

## 2018-09-28 ENCOUNTER — Other Ambulatory Visit: Payer: Self-pay

## 2018-09-28 VITALS — BP 157/107 | HR 91 | Ht 75.0 in | Wt 315.0 lb

## 2018-09-28 DIAGNOSIS — S46011D Strain of muscle(s) and tendon(s) of the rotator cuff of right shoulder, subsequent encounter: Secondary | ICD-10-CM

## 2018-09-28 MED ORDER — METHOCARBAMOL 500 MG PO TABS: 500.0000 mg | ORAL_TABLET | Freq: Four times a day (QID) | ORAL | 0 refills | Status: DC

## 2018-09-28 NOTE — Progress Notes (Signed)
Office Visit Note   Patient: Zykeem Bauserman           Date of Birth: 1981-03-19           MRN: [id number] Visit Date: 09/28/2018              Requested by: Enid Skeens., MD [address],  [place] [address] PCP: Enid Skeens., MD   Assessment & Plan: Visit Diagnoses:  1. Traumatic complete tear of right rotator cuff, subsequent encounter     Plan: 5 days status post arthroscopic SCD and mini open rotator cuff tear repair involving supraspinatus tendon.  Doing well.  Having some "spasm at night" we will continue with oxycodone and add Robaxin.  Start physical therapy for passive range of motion at [facility] [place].  Continue with sling.  Office 2 weeks.  No use of right upper extremity  Follow-Up Instructions: Return in about 2 weeks (around 10/12/2018).   Orders:  No orders of the defined types were placed in this encounter.  Meds ordered this encounter  Medications  . methocarbamol (ROBAXIN) 500 MG tablet    Sig: Take 1 tablet (500 mg total) by mouth 4 (four) times daily.    Dispense:  30 tablet    Refill:  0    Order Specific Question:   Supervising Provider    Answer:   Garald Balding [[id number]]      Procedures: No procedures performed   Clinical Data: No additional findings.   Subjective: Chief Complaint  Patient presents with  . Right Shoulder - Routine Post Op    Right shoulder scope DOS 09/23/18  Patient presents today for a post op visit on his right shoulder. He is now 5 days out from right shoulder scope with SAD, and rotator cuff tear repair. Patient states that he is hurting. He has been taking oxycodone and tylenol. He is wearing his sling.  HPI  Review of Systems   Objective: Vital Signs: BP (!) 157/107   Pulse 91   Ht 6\' 3"  (1.905 m)   Wt (!) 315 lb (142.9 kg)   BMI 39.37 kg/m   Physical Exam  Ortho Exam right shoulder wounds healing without problem.  New Steri-Strips applied over benzoin.  Good grip and good release.  Specialty Comments:  No specialty comments available.  Imaging: No results found.   PMFS History: Patient Active Problem List   Diagnosis Date Noted  . Tear of right rotator cuff 09/23/2018  . Psoriasis 03/27/2017  . 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 Patellofemoral arthralgia of both knees 02/24/2017  . Hyperuricemia 03/26/2016  . Elevated blood pressure  03/26/2016  . Elevated LFTs 03/26/2016  . Anxiety 03/26/2016  . Psoriatic arthritis ([place]) 03/25/2016  . High risk medication use 03/25/2016  . Cervicalgia 03/25/2016  . Hand pain 03/25/2016  . 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 Anxiety   . Gout   . Hypertension     Family History  Problem Relation Age of Onset  . Cancer Mother        breast   . Bipolar disorder Mother   . Hypertension Father   . Osteoarthritis Father   . Emphysema Maternal Grandfather   . CAD Paternal Grandfather   . Healthy Son   . Healthy Son   . Healthy Daughter   . Healthy Daughter     Past Surgical History:  Procedure Laterality Date  . ANTERIOR  CRUCIATE LIGAMENT REPAIR Bilateral Left in 2002, Right 2013  . ANTERIOR CRUCIATE LIGAMENT REPAIR Bilateral 2014  . SHOULDER ARTHROSCOPY WITH SUBACROMIAL DECOMPRESSION AND OPEN ROTATOR C Right 09/23/2018   Procedure: SHOULDER ARTHROSCOPY WITH SUBACROMIAL DECOMPRESSION AND OPEN ROTATOR CUFF REPAIR,;  Surgeon: Valeria BatmanWhitfield, Peter W, MD;  Location: [facility];  Service: Orthopedics;  Laterality: Right;  WITH BLOCK  . VASECTOMY     Social History   Occupational History  . Not on file  Tobacco Use  . Smoking status: Former Smoker    Packs/day: 0.25    Years: 5.00    Pack years: 1.25    Types: Cigarettes    Quit date: 03/27/2012    Years since quitting: 6.5  . Smokeless tobacco: Former Engineer, waterUser  Substance and Sexual Activity  . Alcohol use: Yes    Alcohol/week: 2.0 standard drinks    Types: 2 Cans of beer per week  . Drug use: No  . Sexual activity: Yes     Birth control/protection: Condom

## 2018-09-28 NOTE — Addendum Note (Signed)
Addended by: Lendon Collar on: 09/28/2018 11:42 AM   Modules accepted: Orders

## 2018-09-29 ENCOUNTER — Other Ambulatory Visit: Payer: Self-pay | Admitting: Orthopaedic Surgery

## 2018-09-29 ENCOUNTER — Telehealth: Payer: Self-pay | Admitting: Orthopaedic Surgery

## 2018-09-29 ENCOUNTER — Encounter: Payer: Self-pay | Admitting: Orthopaedic Surgery

## 2018-09-29 MED ORDER — OXYCODONE HCL 5 MG PO CAPS: 5.0000 mg | ORAL_CAPSULE | ORAL | 0 refills | Status: DC | PRN

## 2018-09-29 NOTE — Progress Notes (Signed)
Office Visit Note  Patient: Miguel Evans             Date of Birth: 24-Mar-1981           MRN: [id number]             PCP: Nonnie DoneSlatosky, John J., MD Referring: Nonnie DoneSlatosky, John J., MD Visit Date: 10/11/2018 Occupation: @GUAROCC @  Subjective:  Pain and swelling in multiple joints.   History of Present Illness: Miguel Evans is a 38 y.o. male with history of psoriatic arthritis and psoriasis.  He was having pain in his right shoulder joint and the MRI revealed a rotator cuff tear.  He came off the medications about 5 weeks ago for surgery but the surgery was postponed for several reasons until 3 weeks ago.  He states he has been recovering well from the surgery and started doing physical therapy.  Although since he has been off Cosentyx and methotrexate he has been having increased pain and swelling in multiple joints.  He describes discomfort in his bilateral hands, bilateral knee joints, bilateral feet and C-spine .  He has not had any psoriasis flare.  Activities of Daily Living:  Patient reports morning stiffness for 90 minutes.   Patient Reports nocturnal pain.  Difficulty dressing/grooming: Reports Difficulty climbing stairs: Reports Difficulty getting out of chair: Reports Difficulty using hands for taps, buttons, cutlery, and/or writing: Reports  Review of Systems  Constitutional: Positive for fatigue.  HENT: Negative for mouth sores, mouth dryness and nose dryness.   Eyes: Negative for itching and dryness.  Respiratory: Negative for shortness of breath and difficulty breathing.   Cardiovascular: Negative for chest pain and palpitations.  Gastrointestinal: Negative for blood in stool, constipation and diarrhea.  Endocrine: Negative for increased urination.  Genitourinary: Negative for painful urination and pelvic pain.  Musculoskeletal: Positive for arthralgias, joint pain, joint swelling and morning stiffness.  Skin: Negative for rash and redness.  Allergic/Immunologic: Negative for  susceptible to infections.  Neurological: Positive for weakness. Negative for dizziness, headaches and memory loss.  Hematological: Negative for swollen glands.  Psychiatric/Behavioral: Negative for confusion. The patient is not nervous/anxious.     PMFS History:  Patient Active Problem List   Diagnosis Date Noted  . Tear of right rotator cuff 09/23/2018  . Psoriasis 03/27/2017  . 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 Patellofemoral arthralgia of both knees 02/24/2017  . Hyperuricemia 03/26/2016  . Elevated blood pressure  03/26/2016  . Elevated LFTs 03/26/2016  . Anxiety 03/26/2016  . Psoriatic arthritis (HCC) 03/25/2016  . High risk medication use 03/25/2016  . Cervicalgia 03/25/2016  . Hand pain 03/25/2016  . 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 Anxiety   . Gout   . Hypertension     Family History  Problem Relation Age of Onset  . Cancer Mother        breast   . Bipolar disorder Mother   . Hypertension Father   . Osteoarthritis Father   . Emphysema Maternal Grandfather   . CAD Paternal Grandfather   . Healthy Son   . Healthy Son   . Healthy Daughter   . Healthy Daughter    Past Surgical History:  Procedure Laterality Date  . ANTERIOR CRUCIATE LIGAMENT REPAIR Bilateral Left in 2002, Right 2013  . ANTERIOR CRUCIATE LIGAMENT REPAIR Bilateral 2014  . SHOULDER ARTHROSCOPY WITH SUBACROMIAL DECOMPRESSION AND OPEN ROTATOR C Right 09/23/2018   Procedure: SHOULDER ARTHROSCOPY WITH SUBACROMIAL DECOMPRESSION AND OPEN  ROTATOR CUFF REPAIR,;  Surgeon: Garald Balding, MD;  Location: [facility];  Service: Orthopedics;  Laterality: Right;  WITH BLOCK  . VASECTOMY     Social History   Social History Narrative  . Not on file    There is no immunization history on file for this patient.   Objective: Vital Signs: BP (!) 171/109 (BP Location: Left Wrist, Patient Position: Sitting, Cuff Size: Normal)   Pulse 90   Resp 14   Ht 6\' 3"  (1.905 m)    Wt (!) 318 lb 12.8 oz (144.6 kg)   BMI 39.85 kg/m    Physical Exam Vitals signs and nursing note reviewed.  Constitutional:      Appearance: He is well-developed.  HENT:     Head: Normocephalic and atraumatic.  Eyes:     Conjunctiva/sclera: Conjunctivae normal.     Pupils: Pupils are equal, round, and reactive to light.  Neck:     Musculoskeletal: Normal range of motion and neck supple.  Cardiovascular:     Rate and Rhythm: Normal rate and regular rhythm.     Heart sounds: Normal heart sounds.  Pulmonary:     Effort: Pulmonary effort is normal.     Breath sounds: Normal breath sounds.  Abdominal:     General: Bowel sounds are normal.     Palpations: Abdomen is soft.  Skin:    General: Skin is warm and dry.     Capillary Refill: Capillary refill takes less than 2 seconds.  Neurological:     Mental Status: He is alert and oriented to person, place, and time.  Psychiatric:        Behavior: Behavior normal.      Musculoskeletal Exam: He has a stiffness with range of motion of the cervical spine.  Lumbar spine was in good range of motion.  He had no SI joint tenderness.  Right shoulder joint was in a sling.  Left shoulder joint was in good range of motion.  He has some discomfort on palpation over bilateral elbow joints with no synovitis.  He had tenderness and synovitis in his hands as described below.  He has some discomfort in the knee joints.  No dactylitis and his toes was noted.  CDAI Exam: CDAI Score: 11.2  Patient Global: 6 mm; Provider Global: 6 mm Swollen: 2 ; Tender: 9  Joint Exam      Right  Left  Elbow   Tender   Tender  MCP 1   Tender     MCP 2   Tender     PIP 2     Swollen Tender  PIP 3     Swollen Tender  Cervical Spine   Tender     Knee   Tender   Tender     Investigation: No additional findings.  Imaging: No results found.  Recent Labs: Lab Results  Component Value Date   WBC 6.0 09/22/2018   HGB 15.2 09/22/2018   PLT 188 09/22/2018    NA 137 09/22/2018   K 4.1 09/22/2018   CL 104 09/22/2018   CO2 24 09/22/2018   GLUCOSE 112 (H) 09/22/2018   BUN 14 09/22/2018   CREATININE 0.84 09/22/2018   BILITOT 1.2 06/11/2018   ALKPHOS 74 10/24/2016   AST 23 06/11/2018   ALT 33 06/11/2018   PROT 7.5 06/11/2018   ALBUMIN 4.7 10/24/2016   CALCIUM 9.0 09/22/2018   GFRAA >60 09/22/2018   QFTBGOLDPLUS NEGATIVE 10/12/2017    Speciality Comments: No  specialty comments available.  Procedures:  No procedures performed Allergies: Patient has no known allergies.   Assessment / Plan:     Visit Diagnoses: Psoriatic arthritis (HCC) -he has been off Cosentyx and methotrexate for almost 5 weeks now.  He is having a flare with increased pain and discomfort in multiple joints.  He will resume methotrexate today.  He is 3 weeks status post surgery for right rotator cuff tear repair.  He is having difficulty getting Cosentyx.  We will evaluate the situation.    Psoriasis -he has no active psoriasis lesions today.  High risk medication use - Cosentyx 150 mg subcutaneous every other week, MTX 0.6 ml sq inj once weekly, which has been on hold for the last 5 weeks.  Folic acid 1 mg daily.(inadequate response to Enbrel and Humira).  - Plan: AST, ALT, QuantiFERON-TB Gold Plus,    Idiopathic chronic gout of multiple sites without tophus -he has been on allopurinol and colchicine.  Plan: Uric acid,  DDD (degenerative disc disease), cervical -he continues to have some chronic neck pain.  Chronic left SI joint pain -he is currently not having any tenderness.  S/P right rotator cuff repair - Dr. Cleophas DunkerWhitfield -he is 3 weeks post surgery and is recovering well.  History of hypertension -his blood pressure is very elevated.  Have advised him to see his PCP.  Fibromyalgia -he has history of generalized myalgias.  Orders: Orders Placed This Encounter  Procedures  . AST  . ALT  . QuantiFERON-TB Gold Plus  . Uric acid   No orders of the defined types  were placed in this encounter.   Face-to-face time spent with patient was 35 minutes. Greater than 50% of time was spent in counseling and coordination of care.  Follow-Up Instructions: Return in about 3 months (around 01/11/2019) for Psoriatic arthritis.   Pollyann SavoyShaili Kanton Kamel, MD  Note - This record has been created using Animal nutritionistDragon software.  Chart creation errors have been sought, but may not always  have been located. Such creation errors do not reflect on  the standard of medical care.

## 2018-09-29 NOTE — Telephone Encounter (Signed)
Called patient. No answer. Left message that pain medicine had been sent into [facility] this afternoon.

## 2018-10-01 ENCOUNTER — Other Ambulatory Visit: Payer: Self-pay | Admitting: Orthopaedic Surgery

## 2018-10-01 ENCOUNTER — Encounter: Payer: Self-pay | Admitting: Orthopaedic Surgery

## 2018-10-01 MED ORDER — OXYCODONE-ACETAMINOPHEN 5-325 MG PO TABS: 1.0000 | ORAL_TABLET | Freq: Four times a day (QID) | ORAL | 0 refills | Status: DC | PRN

## 2018-10-04 ENCOUNTER — Telehealth: Payer: Self-pay | Admitting: Orthopaedic Surgery

## 2018-10-04 NOTE — Telephone Encounter (Signed)
09/23/2018 OPnote faxed to [facility] [phone]

## 2018-10-07 ENCOUNTER — Telehealth: Payer: Self-pay

## 2018-10-07 ENCOUNTER — Other Ambulatory Visit: Payer: Self-pay | Admitting: Orthopaedic Surgery

## 2018-10-07 DIAGNOSIS — S46011D Strain of muscle(s) and tendon(s) of the rotator cuff of right shoulder, subsequent encounter: Secondary | ICD-10-CM

## 2018-10-07 NOTE — Telephone Encounter (Signed)
Faxed

## 2018-10-07 NOTE — Telephone Encounter (Signed)
New referral sent with protocol on it. Faxed to [facility] [phone]

## 2018-10-07 NOTE — Telephone Encounter (Signed)
Passive range of motion for total of 6 weeks from the time of surgery then progressed to active assisted and active range of motion.  Must wear the sling for that 6-week period of time

## 2018-10-07 NOTE — Telephone Encounter (Signed)
Please advise 

## 2018-10-11 ENCOUNTER — Encounter: Payer: Self-pay | Admitting: Physician Assistant

## 2018-10-11 ENCOUNTER — Ambulatory Visit (INDEPENDENT_AMBULATORY_CARE_PROVIDER_SITE_OTHER): Payer: BC Managed Care – PPO | Admitting: Rheumatology

## 2018-10-11 ENCOUNTER — Other Ambulatory Visit: Payer: Self-pay

## 2018-10-11 VITALS — BP 171/109 | HR 90 | Resp 14 | Ht 75.0 in | Wt 318.8 lb

## 2018-10-11 DIAGNOSIS — M797 Fibromyalgia: Secondary | ICD-10-CM

## 2018-10-11 DIAGNOSIS — R7989 Other specified abnormal findings of blood chemistry: Secondary | ICD-10-CM

## 2018-10-11 DIAGNOSIS — L4050 Arthropathic psoriasis, unspecified: Secondary | ICD-10-CM | POA: Diagnosis not present

## 2018-10-11 DIAGNOSIS — R945 Abnormal results of liver function studies: Secondary | ICD-10-CM

## 2018-10-11 DIAGNOSIS — L409 Psoriasis, unspecified: Secondary | ICD-10-CM | POA: Diagnosis not present

## 2018-10-11 DIAGNOSIS — Z9889 Other specified postprocedural states: Secondary | ICD-10-CM

## 2018-10-11 DIAGNOSIS — M533 Sacrococcygeal disorders, not elsewhere classified: Secondary | ICD-10-CM

## 2018-10-11 DIAGNOSIS — M1A09X0 Idiopathic chronic gout, multiple sites, without tophus (tophi): Secondary | ICD-10-CM | POA: Diagnosis not present

## 2018-10-11 DIAGNOSIS — G8929 Other chronic pain: Secondary | ICD-10-CM

## 2018-10-11 DIAGNOSIS — Z79899 Other long term (current) drug therapy: Secondary | ICD-10-CM

## 2018-10-11 DIAGNOSIS — Z8679 Personal history of other diseases of the circulatory system: Secondary | ICD-10-CM

## 2018-10-11 DIAGNOSIS — M5030 Other cervical disc degeneration, unspecified cervical region: Secondary | ICD-10-CM

## 2018-10-11 NOTE — Patient Instructions (Signed)
Standing Labs We placed an order today for your standing lab work.    Please come back and get your standing labs in October and every 3 months   We have open lab daily Monday through Thursday from 8:30-12:30 PM and 1:30-4:30 PM and Friday from 8:30-12:30 PM and 1:30 -4:00 PM at the office of Dr. Dmarcus Decicco.   You may experience shorter wait times on Monday and Friday afternoons. The office is located at [address], [address], [place], [place] [address] No appointment is necessary.   Labs are drawn by [facility].  You may receive a bill from [facility] for your lab work.  If you wish to have your labs drawn at another location, please call the office 24 hours in advance to send orders.  If you have any questions regarding directions or hours of operation,  please call [phone].   Just as a reminder please drink plenty of water prior to coming for your lab work. Thanks!   

## 2018-10-12 ENCOUNTER — Ambulatory Visit (INDEPENDENT_AMBULATORY_CARE_PROVIDER_SITE_OTHER): Payer: BC Managed Care – PPO | Admitting: Orthopaedic Surgery

## 2018-10-12 ENCOUNTER — Other Ambulatory Visit: Payer: Self-pay | Admitting: Pharmacist

## 2018-10-12 ENCOUNTER — Encounter: Payer: Self-pay | Admitting: Orthopaedic Surgery

## 2018-10-12 ENCOUNTER — Telehealth: Payer: Self-pay | Admitting: Pharmacy Technician

## 2018-10-12 VITALS — BP 156/110 | HR 110 | Ht 75.0 in | Wt 315.0 lb

## 2018-10-12 DIAGNOSIS — L409 Psoriasis, unspecified: Secondary | ICD-10-CM

## 2018-10-12 DIAGNOSIS — L4050 Arthropathic psoriasis, unspecified: Secondary | ICD-10-CM

## 2018-10-12 DIAGNOSIS — S46011D Strain of muscle(s) and tendon(s) of the rotator cuff of right shoulder, subsequent encounter: Secondary | ICD-10-CM

## 2018-10-12 MED ORDER — SECUKINUMAB 150 MG/ML ~~LOC~~ SOAJ: 150.0000 mg | SUBCUTANEOUS | 0 refills | Status: DC

## 2018-10-12 NOTE — Progress Notes (Signed)
Patient had difficulty obtaining medication and states it was an issue with his co-pay card on file.  Rachael patient advocate called specialty pharmacy. He has a co-pay card on file.  He has a $16 balance and his account was locked and prescription voided.  Patient notified and new prescription sent to [facility].

## 2018-10-12 NOTE — Progress Notes (Signed)
ALT is elevated. Avoid NSAIDS and alcohol.

## 2018-10-12 NOTE — Telephone Encounter (Signed)
Called [facility] to follow up on patient's Cosentyx prescription. Per rep Ava, they were unable to reach patient and he has a $16 balance with them. They closed his account and voided his prescription due to inactivity. Rep did confirm that patient's Cosentyx copay card is on file. Patient will need to call in to reactivate his account. Amber Rph will send in a new prescription to [facility].  Called patient and left a detailed message of the above info. As it seems, he is confused as to what pharmacy he is using. Cosentyx will be filled by [facility].  [facility]- [phone]  [facility]- [phone]- patient may call this number to request additional financial support if needed. They will only speak to the patient.  9:29 AM Beatriz Chancellor, CPhT

## 2018-10-12 NOTE — Progress Notes (Signed)
Office Visit Note   Patient: Miguel Evans           Date of Birth: [birth year]           MRN: [id number] Visit Date: 10/12/2018              Requested by: Enid Skeens., MD [address],  [place] [address] PCP: Enid Skeens., MD   Assessment & Plan: Visit Diagnoses:  1. Traumatic complete tear of right rotator cuff, subsequent encounter     Plan: 3 weeks status post arthroscopic subacromial decompression right shoulder and mini open rotator cuff tear repair.  Doing well.  Has started physical therapy for passive motion.  We will continue with the sling.  Office 2 weeks.still no use of right arm  Follow-Up Instructions: Return in about 2 weeks (around 10/26/2018).   Orders:  No orders of the defined types were placed in this encounter.  No orders of the defined types were placed in this encounter.     Procedures: No procedures performed   Clinical Data: No additional findings.   Subjective: Chief Complaint  Patient presents with  . Right Shoulder - Routine Post Op    Right shoulder scope DOS 09/23/18  Patient presents today for his right shoulder. He is almost 3 weeks out from right shoulder arthroscope with SAD, and rotator cuff tear repair. He is going to therapy twice weekly. Patient states that he is doing well, he has more pain in the morning upon getting up. He wears his sling. He is taking pain medicine only if needed.   HPI  Review of Systems   Objective: Vital Signs: BP (!) 156/110   Pulse (!) 110   Ht 6\' 3"  (1.905 m)   Wt (!) 315 lb (142.9 kg)   BMI 39.37 kg/m   Physical Exam  Ortho Exam incisions right shoulder healing without problem.  I could passively raise the arm fully overhead.  I can abduct to 90 degrees.  Neurologically intact.  Specialty Comments:  No specialty comments available.  Imaging: No results found.   PMFS History: Patient Active Problem List   Diagnosis Date Noted  . Tear of right rotator cuff 09/23/2018  .  Psoriasis 03/27/2017  . 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 Patellofemoral arthralgia of both knees 02/24/2017  . Hyperuricemia 03/26/2016  . Elevated blood pressure  03/26/2016  . Elevated LFTs 03/26/2016  . Anxiety 03/26/2016  . Psoriatic arthritis ([place]) 03/25/2016  . High risk medication use 03/25/2016  . Cervicalgia 03/25/2016  . Hand pain 03/25/2016  . 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 Anxiety   . Gout   . Hypertension     Family History  Problem Relation Age of Onset  . Cancer Mother        breast   . Bipolar disorder Mother   . Hypertension Father   . Osteoarthritis Father   . Emphysema Maternal Grandfather   . CAD Paternal Grandfather   . Healthy Son   . Healthy Son   . Healthy Daughter   . Healthy Daughter     Past Surgical History:  Procedure Laterality Date  . ANTERIOR CRUCIATE LIGAMENT REPAIR Bilateral Left in 2002, Right 2013  . ANTERIOR CRUCIATE LIGAMENT REPAIR Bilateral 2014  . SHOULDER ARTHROSCOPY WITH SUBACROMIAL DECOMPRESSION AND OPEN ROTATOR C Right 09/23/2018   Procedure: SHOULDER ARTHROSCOPY WITH SUBACROMIAL DECOMPRESSION AND OPEN ROTATOR CUFF REPAIR,;  Surgeon: Garald Balding,  MD;  Location: [facility];  Service: Orthopedics;  Laterality: Right;  WITH BLOCK  . VASECTOMY     Social History   Occupational History  . Not on file  Tobacco Use  . Smoking status: Former Smoker    Packs/day: 0.25    Years: 5.00    Pack years: 1.25    Types: Cigarettes    Quit date: 03/27/2012    Years since quitting: 6.5  . Smokeless tobacco: Former Engineer, waterUser  Substance and Sexual Activity  . Alcohol use: Yes    Alcohol/week: 2.0 standard drinks    Types: 2 Cans of beer per week  . Drug use: No  . Sexual activity: Yes    Birth control/protection: Condom

## 2018-10-13 LAB — QUANTIFERON-TB GOLD PLUS
Mitogen-NIL: 8.36 IU/mL
NIL: 0.02 IU/mL
QuantiFERON-TB Gold Plus: NEGATIVE
TB1-NIL: 0.01 IU/mL
TB2-NIL: 0 IU/mL

## 2018-10-13 LAB — ALT: ALT: 47 U/L — ABNORMAL HIGH (ref 9–46)

## 2018-10-13 LAB — URIC ACID: Uric Acid, Serum: 6.6 mg/dL (ref 4.0–8.0)

## 2018-10-13 LAB — AST: AST: 24 U/L (ref 10–40)

## 2018-10-13 NOTE — Progress Notes (Signed)
Uric acid is mildly elevated.  The goal is to keep uric acid below 6.  Please advise patient to avoid all red meat, shellfish and alcohol use.

## 2018-10-22 ENCOUNTER — Other Ambulatory Visit: Payer: Self-pay | Admitting: Rheumatology

## 2018-10-22 ENCOUNTER — Encounter: Payer: Self-pay | Admitting: Orthopaedic Surgery

## 2018-10-22 MED ORDER — MITIGARE 0.6 MG PO CAPS: 1.0000 | ORAL_CAPSULE | Freq: Every day | ORAL | 0 refills | Status: DC

## 2018-10-22 NOTE — Telephone Encounter (Signed)
Last Visit: 10/11/2018 Next Visit: 01/11/2019 Labs: 10/11/2018 ALT is elevated. Uric acid is mildly elevated.  Okay to refill per Dr. Estanislado Pandy.

## 2018-10-25 ENCOUNTER — Other Ambulatory Visit: Payer: Self-pay | Admitting: Orthopaedic Surgery

## 2018-10-25 MED ORDER — OXYCODONE-ACETAMINOPHEN 5-325 MG PO TABS: 1.0000 | ORAL_TABLET | Freq: Three times a day (TID) | ORAL | 0 refills | Status: DC | PRN

## 2018-10-25 NOTE — Telephone Encounter (Signed)
Sent in new prescription for oxycodone

## 2018-10-26 ENCOUNTER — Other Ambulatory Visit: Payer: Self-pay

## 2018-10-26 ENCOUNTER — Encounter: Payer: Self-pay | Admitting: Orthopaedic Surgery

## 2018-10-26 ENCOUNTER — Ambulatory Visit (INDEPENDENT_AMBULATORY_CARE_PROVIDER_SITE_OTHER): Payer: BC Managed Care – PPO | Admitting: Orthopaedic Surgery

## 2018-10-26 VITALS — BP 134/95 | HR 83 | Ht 75.0 in | Wt 315.0 lb

## 2018-10-26 DIAGNOSIS — S46011D Strain of muscle(s) and tendon(s) of the rotator cuff of right shoulder, subsequent encounter: Secondary | ICD-10-CM

## 2018-10-26 NOTE — Progress Notes (Signed)
Office Visit Note   Patient: Miguel Evans           Date of Birth: [birth year]           MRN: [id number] Visit Date: 10/26/2018              Requested by: Miguel DoneSlatosky, John J., MD [address] [place]  [place] [id number] PCP: Miguel DoneSlatosky, John J., MD   Assessment & Plan: Visit Diagnoses:  1. Traumatic complete tear of right rotator cuff, subsequent encounter     Plan: 5 weeks status post rotator cuff tear repair right shoulder and doing very well.  Back to work wearing the sling.  Will wear the sling 1 more week.  Continue with physical therapy.  No complications.  [name] relates that he has a few aches and pains but does not have any the pain that he had preoperatively.  He is working with the sling  Follow-Up Instructions: Return in about 3 weeks (around 11/16/2018).   Orders:  No orders of the defined types were placed in this encounter.  No orders of the defined types were placed in this encounter.     Procedures: No procedures performed   Clinical Data: No additional findings.   Subjective: Chief Complaint  Patient presents with  . Right Shoulder - Routine Post Op    Right shoulder scope DOS 09/23/18  Patient presents today for follow up on his right shoulder. He had a right shoulder arthroscopy with SAD and rotator cuff tear repair on 09/23/2018. He is now 5 weeks out from surgery. He said that he is doing well. He has more soreness in the morning and in the evening. He is going to therapy once weekly.   HPI  Review of Systems   Objective: Vital Signs: BP (!) 134/95   Pulse 83   Ht 6\' 3"  (1.905 m)   Wt (!) 315 lb (142.9 kg)   BMI 39.37 kg/m   Physical Exam  Ortho Exam right shoulder incisions healing without problem.  He could actively assist raising his right arm fully over his head with  90 degrees of abduction.  Good grip and good release.  Biceps intact.  Negative impingement.  Specialty Comments:  No specialty comments available.  Imaging: No results found.    PMFS History: Patient Active Problem List   Diagnosis Date Noted  . Tear of right rotator cuff 09/23/2018  . Psoriasis 03/27/2017  . 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 Patellofemoral arthralgia of both knees 02/24/2017  . Hyperuricemia 03/26/2016  . Elevated blood pressure  03/26/2016  . Elevated LFTs 03/26/2016  . Anxiety 03/26/2016  . Psoriatic arthritis (HCC) 03/25/2016  . High risk medication use 03/25/2016  . Cervicalgia 03/25/2016  . Hand pain 03/25/2016  . 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 Anxiety   . Gout   . Hypertension     Family History  Problem Relation Age of Onset  . Cancer Mother        breast   . Bipolar disorder Mother   . Hypertension Father   . Osteoarthritis Father   . Emphysema Maternal Grandfather   . CAD Paternal Grandfather   . Healthy Son   . Healthy Son   . Healthy Daughter   . Healthy Daughter     Past Surgical History:  Procedure Laterality Date  . ANTERIOR CRUCIATE LIGAMENT REPAIR Bilateral Left in 2002, Right 2013  . ANTERIOR CRUCIATE LIGAMENT REPAIR Bilateral  2014  . SHOULDER ARTHROSCOPY WITH SUBACROMIAL DECOMPRESSION AND OPEN ROTATOR C Right 09/23/2018   Procedure: SHOULDER ARTHROSCOPY WITH SUBACROMIAL DECOMPRESSION AND OPEN ROTATOR CUFF REPAIR,;  Surgeon: Garald Balding, MD;  Location: [facility];  Service: Orthopedics;  Laterality: Right;  WITH BLOCK  . VASECTOMY     Social History   Occupational History  . Not on file  Tobacco Use  . Smoking status: Former Smoker    Packs/day: 0.25    Years: 5.00    Pack years: 1.25    Types: Cigarettes    Quit date: 03/27/2012    Years since quitting: 6.5  . Smokeless tobacco: Former Network engineer and Sexual Activity  . Alcohol use: Yes    Alcohol/week: 2.0 standard drinks    Types: 2 Cans of beer per week  . Drug use: No  . Sexual activity: Yes    Birth control/protection: Condom

## 2018-11-08 ENCOUNTER — Telehealth: Payer: Self-pay | Admitting: Pharmacist

## 2018-11-08 NOTE — Telephone Encounter (Signed)
Received prior authorization request via fax from [employer] for Cosentyx. Please start PA in covermymeds.  Mariella Saa, PharmD, [place], [place] Clinical Specialty Pharmacist [phone]  11/08/2018 8:57 AM

## 2018-11-08 NOTE — Telephone Encounter (Signed)
Submitted Prior Authorization to [employer] per request via CoverMyMeds  Received notification from [employer] regarding a prior authorization for [place]. Authorization has been APPROVED from 10/09/2018 to 11/08/2019.   Will send document to scan center.  Authorization # [id number]  10:10 AM Beatriz Chancellor, CPhT

## 2018-11-23 ENCOUNTER — Ambulatory Visit: Payer: BC Managed Care – PPO | Admitting: Orthopaedic Surgery

## 2018-12-28 NOTE — Progress Notes (Addendum)
Office Visit Note  Patient: Miguel Evans             Date of Birth: 08-30-1980           MRN: [id number]             PCP: Nonnie Done., MD Referring: Nonnie Done., MD Visit Date: 01/11/2019 Occupation: @GUAROCC @  Subjective:  Pain in multiple joints    History of Present Illness: Benancio Osmundson is a 38 y.o. male with history of psoriatic arthritis, gout, and DDD.  He is on Cosentyx 150 mg sq every 14 days.  He has not missed any doses recently.  He presents today with pain in multiple joints that started about 1/2 months ago.  He states that the pain progressively has been getting worse and he was unable to get out of bed yesterday.  He is having pain in both knee joints.  He states that the popliteal cyst behind the left knee has returned.  He is also having left SI joint pain as well as trochanter bursitis bilaterally.  He has been having increased pain in both hands and both feet.  He experiences intermittent swelling in his hands and feet as well.  He is also having left Achilles tendinitis.  He has been experiencing severe neck pain.  He states that the pain is been constant and he has been experiencing left-sided radiculopathy.  He has pain with range of motion and is been having nocturnal pain as well.  He had a right rotator cuff repair performed by Dr. Cleophas Dunker in June 2020 and does not have any discomfort at this time.  He continues to have a patch of psoriasis on his abdomen but the scalp psoriasis has cleared.  He denies any recent gout flares.  He continues to take allopurinol 300 mg by mouth daily and Mitigare 0.6 mg by mouth daily  Activities of Daily Living:  Patient reports morning stiffness for several hours.   Patient Reports nocturnal pain.  Difficulty dressing/grooming: Denies Difficulty climbing stairs: Reports Difficulty getting out of chair: Reports Difficulty using hands for taps, buttons, cutlery, and/or writing: Reports  Review of Systems  Constitutional:  Negative for fatigue and night sweats.  HENT: Negative for mouth sores, mouth dryness and nose dryness.   Eyes: Negative for redness, itching and dryness.  Respiratory: Negative for cough, hemoptysis, shortness of breath, wheezing and difficulty breathing.   Cardiovascular: Negative for chest pain, palpitations, hypertension, irregular heartbeat and swelling in legs/feet.  Gastrointestinal: Negative for blood in stool, constipation and diarrhea.  Endocrine: Negative for increased urination.  Genitourinary: Negative for difficulty urinating and painful urination.  Musculoskeletal: Positive for arthralgias, joint pain, joint swelling and morning stiffness. Negative for myalgias, muscle weakness, muscle tenderness and myalgias.  Skin: Positive for rash. Negative for color change, hair loss, nodules/bumps, skin tightness, ulcers and sensitivity to sunlight.  Allergic/Immunologic: Negative for susceptible to infections.  Neurological: Negative for dizziness, fainting, memory loss, night sweats and weakness.  Hematological: Negative for swollen glands.  Psychiatric/Behavioral: Positive for depressed mood. Negative for confusion and sleep disturbance. The patient is nervous/anxious.     PMFS History:  Patient Active Problem List   Diagnosis Date Noted  . Tear of right rotator cuff 09/23/2018  . Psoriasis 03/27/2017  . 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 Patellofemoral arthralgia of both knees 02/24/2017  . Hyperuricemia 03/26/2016  . Elevated blood pressure  03/26/2016  . Elevated LFTs 03/26/2016  . Anxiety 03/26/2016  . Psoriatic arthritis (HCC) 03/25/2016  .  High risk medication use 03/25/2016  . Cervicalgia 03/25/2016  . Hand pain 03/25/2016  . 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 Anxiety   . Gout   . Hypertension     Family History  Problem Relation Age of Onset  . Cancer Mother        breast   . Bipolar disorder Mother   . Hypertension  Father   . Osteoarthritis Father   . Emphysema Maternal Grandfather   . CAD Paternal Grandfather   . Healthy Son   . Healthy Son   . Healthy Daughter   . Healthy Daughter    Past Surgical History:  Procedure Laterality Date  . ANTERIOR CRUCIATE LIGAMENT REPAIR Bilateral Left in 2002, Right 2013  . ANTERIOR CRUCIATE LIGAMENT REPAIR Bilateral 2014  . SHOULDER ARTHROSCOPY WITH SUBACROMIAL DECOMPRESSION AND OPEN ROTATOR C Right 09/23/2018   Procedure: SHOULDER ARTHROSCOPY WITH SUBACROMIAL DECOMPRESSION AND OPEN ROTATOR CUFF REPAIR,;  Surgeon: Valeria Batman, MD;  Location: [facility];  Service: Orthopedics;  Laterality: Right;  WITH BLOCK  . VASECTOMY     Social History   Social History Narrative  . Not on file    There is no immunization history on file for this patient.   Objective: Vital Signs: BP (!) 148/97 (BP Location: Left Arm, Patient Position: Sitting, Cuff Size: Large)   Pulse (!) 103   Resp 16   Ht 6\' 3"  (1.905 m)   Wt (!) 325 lb 12.8 oz (147.8 kg)   BMI 40.72 kg/m    Physical Exam Vitals signs and nursing note reviewed.  Constitutional:      Appearance: He is well-developed.  HENT:     Head: Normocephalic and atraumatic.  Eyes:     Conjunctiva/sclera: Conjunctivae normal.     Pupils: Pupils are equal, round, and reactive to light.  Neck:     Musculoskeletal: Normal range of motion and neck supple.  Cardiovascular:     Rate and Rhythm: Normal rate and regular rhythm.     Heart sounds: Normal heart sounds.  Pulmonary:     Effort: Pulmonary effort is normal.     Breath sounds: Normal breath sounds.  Abdominal:     General: Bowel sounds are normal.     Palpations: Abdomen is soft.  Skin:    General: Skin is warm and dry.     Capillary Refill: Capillary refill takes less than 2 seconds.  Neurological:     Mental Status: He is alert and oriented to person, place, and time.  Psychiatric:        Behavior: Behavior normal.      Musculoskeletal Exam:  C-spine limited ROM.  Thoracic and lumbar spine good range of motion.  He has left SI joint tenderness on exam.  Bilateral shoulder joints have good range of motion with no discomfort. Elbow joints good ROM with no synovitis.  Tenderness and thickening over the right ulnar styloid.   Hip joints good ROM.  Tenderness over bilateral trochanteric bursa.  Knee joints good ROM with discomfort bilaterally.  Baker's cyst palpable behind left knee.  Tenderness of both ankle joints.  Left achilles tendonitis.  No tenderness or swelling of MTPs or PIP joints.  No plantar fasciitis bilaterally.    CDAI Exam: CDAI Score: - Patient Global: -; Provider Global: - Swollen: 3 ; Tender: 14  Joint Exam      Right  Left  Wrist  Swollen Tender  MCP 1   Tender     MCP 2   Tender   Tender  MCP 3      Tender  MCP 5      Tender  PIP 2     Swollen Tender  PIP 3     Swollen Tender  Cervical Spine   Tender     Sacroiliac      Tender  Knee   Tender   Tender  Ankle   Tender   Tender     Investigation: No additional findings.  Imaging: Xr Cervical Spine 2 Or 3 Views  Result Date: 01/11/2019 No significant disc space narrowing was noted.  Anterior calcification was noted between C5-C6. Impression: These findings are consistent with mild degenerative changes.  Xr Foot 2 Views Left  Result Date: 01/11/2019 First MTP, PIP and DIP narrowing was noted.  No erosive changes were noted.  No intertarsal or tibiotalar joint space narrowing was noted.  Posterior inferior calcaneal spurs were noted. Impression: These findings are consistent with osteoarthritis of the foot.  Xr Foot 2 Views Right  Result Date: 01/11/2019 Severe narrowing of the first MTP joint was noted.  PIP and DIP narrowing was noted.  None of the other MTP joints showed any narrowing.  No erosive changes were noted.  No intertarsal joint space narrowing was noted.  No tibiotalar subtalar joint space narrowing was noted.  Posterior calcaneal spur was  noted. 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  Xr Hand 2 View Left  Result Date: 01/11/2019 Severe narrowing of first MCP joint with possible erosive changes were noted.  CMC narrowing was noted.  Radiocarpal joint space narrowing was noted.  No intercarpal or metacarpocarpal joint space narrowing was noted.  Mild PIP and DIP joint space narrowing was noted. Impression: These findings are consistent with psoriatic arthritis.  Xr Hand 2 View Right  Result Date: 01/11/2019 Right first MCP narrowing was noted.  Minimal PIP and DIP narrowing was noted.  No metacarpocarpal, intercarpal joint space narrowing was noted.  Radiocarpal joint space narrowing was noted.  Erosion was noted over ulnar styloid. 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   Recent Labs: Lab Results  Component Value Date   WBC 6.0 09/22/2018   HGB 15.2 09/22/2018   PLT 188 09/22/2018   NA 137 09/22/2018   K 4.1 09/22/2018   CL 104 09/22/2018   CO2 24 09/22/2018   GLUCOSE 112 (H) 09/22/2018   BUN 14 09/22/2018   CREATININE 0.84 09/22/2018   BILITOT 1.2 06/11/2018   ALKPHOS 74 10/24/2016   AST 24 10/11/2018   ALT 47 (H) 10/11/2018   PROT 7.5 06/11/2018   ALBUMIN 4.7 10/24/2016   CALCIUM 9.0 09/22/2018   GFRAA >60 09/22/2018   QFTBGOLDPLUS NEGATIVE 10/11/2018    Speciality Comments: No specialty comments available.  Procedures:  Large Joint Inj: L knee (Popliteal cyst) on 01/11/2019 12:21 PM Indications: pain Details: 27 G 1.5 in needle, ultrasound-guided posterior approach  Arthrogram: No  Medications: 1.5 mL lidocaine 1 %; 60 mg triamcinolone acetonide 40 MG/ML Aspirate: 23 mL clear Outcome: tolerated well, no immediate complications Procedure, treatment alternatives, risks and benefits explained, specific risks discussed. Consent was given by the patient. Immediately prior to procedure a time out was called to verify the correct  patient, procedure, equipment, support staff and site/side marked as required. Patient was prepped and draped in the usual sterile fashion.     Allergies: Patient has  no known allergies.   Assessment / Plan:     Visit Diagnoses: Psoriatic arthritis (HCC): He has synovitis of multiple joints as described above.  He has been having a flare for the past 1-1/2 months.  He has pain in multiple joints including C-spine, bilateral hands, bilateral knee joints, bilateral feet, and left SI joint.  Popliteal cyst present in the posterior aspect of the left knee joint, which was aspirated today-23 ml clear fluid was drawn off.  He has left Achilles tendinitis and left SI joint tenderness on exam.  He has a chronic patch of psoriasis on his abdomen but no scalp psoriasis at this time.  He has been injecting Cosentyx 150 mg subcutaneously every 14 days and injecting methotrexate 0.8 mL once weekly.  He has not missed any doses recently.  He continues to have recurrent flares.  He declined a prednisone taper at this time.  We discussed switching him from Cosentyx to Taltz subcutaneous injections.  Indications, contraindications, potential side effects of Taltz were discussed.  All questions were addressed and consent was obtained today.  He will continue on methotrexate as prescribed.  He will follow up in 1 month.   Medication counseling: Baseline Immunosuppressant Therapy Labs TB GOLD Quantiferon TB Gold Latest Ref Rng & Units 10/11/2018  Quantiferon TB Gold Plus NEGATIVE NEGATIVE   Chest Xray: no active cardiopulmonary disease 10/08/15  Does patient have a history of inflammatory bowel disease? No  Counseled patient that Altamease Oileraltz is a IL-17 inhibitor that works to reduce pain and inflammation associated with arthritis.  Counseled patient on purpose, proper use, and adverse effects of Taltz. Reviewed the most common adverse effects of infection, inflammatory bowel disease, and allergic reaction. Counseled patient  that Altamease Oileraltz should be held for infection and prior to scheduled surgery.  Counseled patient to avoid live vaccines while on Taltz.  Advised patient to get annual influenza vaccine, pneumococcal vaccine, and Shingrix as indicated.  Reviewed storage information for Taltz.  Reviewed the importance of regular labs while on Taltz. Standing orders placed and is to return in 1 month and then every 3 months after initiation.  Provided patient with medication education material and answered all questions.  Patient consented to Taltz.  Will upload consent into patient's chart.  Will apply for Taltz through patient's insurance and update when we receive a response.  Advised initial injection must be administered in office.  Patient voiced understanding.    Taltz dose will be: For psoriatic arthritis load of 160 mg then 80 mg every 28 days  Prescription will be sent to pharmacy pending lab results and insurance approval.  Psoriasis: He has a patch of psoriasis on his abdomen.  Scalp psoriasis has resolved.  Overall he has had good skin clearance while on Cosentyx.  High risk medication use -we will be applying for Taltz 80 mg subcutaneous injections. inadequate response to Enbrel, Humira, and Cosentyx. - Plan: CBC with Differential/Platelet, COMPLETE METABOLIC PANEL WITH GFR  Pain in both hands - He has intermittent pain and swelling in both hands.  He has tenderness and inflammation as described above.  X-rays of both hands were obtained today to assess for interval change.  Plan: XR Hand 2 View Right, XR Hand 2 View Left  Pain in both feet -He has been having increased pain in both feet.  He has intermittent swelling in both ankle joints and occasionally in his toes.  X-rays of both feet were obtained today to assess for interval change.  Plan:  XR Foot 2 Views Right, XR Foot 2 Views Left  Primary osteoarthritis of both knee: X-rays of both knee joints were obtained on 03/27/2017 which revealed moderate  osteoarthritis and moderate chondromalacia patella.  He has chronic pain in both knee joints.  He has tenderness on exam.  He has discomfort with flexion and extension.  He has a popliteal cyst in the posterior aspect of the left knee joint.  He requested an aspiration of the popliteal cyst today.  He tolerated the procedure well.  Synovial cyst of left popliteal space: He has a palpable tender popliteal cyst in the posterior aspect of the left knee.  He has been having increased discomfort with flexion and extension of the left knee joint.  He requested an aspiration and cortisone injection today.  Ultrasound was utilized.  23 mL of clear fluid was aspirated from the popliteal cyst. He tolerated procedure well.  The procedure note was completed above.  Idiopathic chronic gout of multiple sites without tophus - He has not had any recent gout flares.  He is clinically doing well on allopurinol 300 mg by mouth daily and Mitigare 0.6 mg daily.  Most recent uric acid: 10/11/2018 6.6.  He will continue on the current treatment regimen.  He does not need any refills at this time.  He was advised to notify us if he develops signs or symptoms of a gout flare.  DDD (degenerative disc disease), cervical: He has been experiencing ongoing severe pain in his neck.  He describes the pain as constant and sharp.  He is also been experiencing left-sided radiculopathy.  X-rays of the C-spine were obtained on 02/19/2017 which revealed minimal spondylosis with mild disc space narrowing at C6-C7.  X-rays of the C-spine were repeated today.  He would like to proceed with scheduling an MRI of the C-spine at this time.  Neck pain -He has been experiencing increased neck pain and left-sided radiculopathy.  X-rays of the C-spine were obtained today.  We will order an MRI of the C-spine.  Plan: XR Cervical Spine 2 or 3 views, MR CERVICAL SPINE WO CONTRAST  Cervical radiculopathy - He has chronic neck pain and stiffness.  He has  discomfort with extension and lateral rotation bilaterally.  He has been experiencing left-sided radiculopathy.  X-rays of the C-spine were updated today and an MRI of the C-spine will be ordered.  Plan: XR Cervical Spine 2 or 3 views, MR CERVICAL SPINE WO CONTRAST  Chronic left SI joint pain: He has left SI joint tenderness on exam.  Trochanteric bursitis of both hips: He has tenderness over bilateral trochanteric bursa.  He was given a handout of exercises to perform.  He has been experiencing nocturnal pain when lying on his sides.  S/P right rotator cuff repair -Performed by Dr. Cleophas Dunker in June 2020.  Doing well.  Good range of motion with no discomfort at this time.  Fibromyalgia: He continues to have generalized muscle aches muscle tenderness due to fibromyalgia.  He states his fibromyalgia seems to flare when his psoriatic arthritis flares.  He has chronic fatigue related to insomnia.  He has been experiencing nocturnal pain which has been worsening his fatigue.  We discussed the importance of regular exercise.   History of hypertension  Orders: Orders Placed This Encounter  Procedures  . Large Joint Inj  . XR Hand 2 View Right  . XR Hand 2 View Left  . XR Foot 2 Views Right  . XR Foot 2 Views Left  .  XR Cervical Spine 2 or 3 views  . MR CERVICAL SPINE WO CONTRAST  . CBC with Differential/Platelet  . COMPLETE METABOLIC PANEL WITH GFR   No orders of the defined types were placed in this encounter.   Face-to-face time spent with patient was 30 minutes. Greater than 50% of time was spent in counseling and coordination of care.  Follow-Up Instructions: Return in 4 weeks (on 02/08/2019) for Psoriatic arthritis, Gout, DDD.   Gearldine Bienenstockaylor M Malie Kashani, PA-C   I examined and evaluated the patient with Sherron Alesaylor Maryssa Giampietro PA.  Patient was having a flare of psoriatic arthritis with pain and swelling in multiple joints today.  He has synovitis on examination in multiple joints and dactylitis.  He also  had left popliteal cyst on my examination.  He had failed Enbrel and Humira in the past.  He has been on Cosentyx and it is not working well for him as well.  He has been using methotrexate in combination with Cosentyx.  We had detailed discussion regarding different treatment options.  After reviewing indications uppercase contraindications we decided to start him on Taltz.  Weight loss diet and exercise was also emphasized.  After informed consent was obtained left knee joint popliteal cyst was aspirated.  It was also injected with cortisone as described above.  He tolerated the procedure well.  Patient did not want any prednisone taper despite having severe arthritis flare.  The plan of care was discussed as noted above.  Pollyann SavoyShaili Deveshwar, MD  Note - This record has been created using [employer] software.  Chart creation errors have been sought, but may not always  have been located. Such creation errors do not reflect on  the standard of medical care.

## 2019-01-03 ENCOUNTER — Other Ambulatory Visit: Payer: Self-pay | Admitting: Rheumatology

## 2019-01-03 ENCOUNTER — Encounter: Payer: Self-pay | Admitting: Rheumatology

## 2019-01-03 NOTE — Telephone Encounter (Signed)
Last Visit: 10/11/2018 Next Visit: 01/11/2019 Labs: 10/11/2018 ALT is elevated. Uric acid is mildly elevated.  Okay to refill per Dr. Estanislado Pandy

## 2019-01-11 ENCOUNTER — Telehealth: Payer: Self-pay | Admitting: Pharmacist

## 2019-01-11 ENCOUNTER — Ambulatory Visit: Payer: BC Managed Care – PPO | Admitting: Rheumatology

## 2019-01-11 ENCOUNTER — Ambulatory Visit: Payer: Self-pay

## 2019-01-11 ENCOUNTER — Encounter: Payer: Self-pay | Admitting: Rheumatology

## 2019-01-11 ENCOUNTER — Other Ambulatory Visit: Payer: Self-pay

## 2019-01-11 VITALS — BP 148/97 | HR 103 | Resp 16 | Ht 75.0 in | Wt 325.8 lb

## 2019-01-11 DIAGNOSIS — M79672 Pain in left foot: Secondary | ICD-10-CM

## 2019-01-11 DIAGNOSIS — L4050 Arthropathic psoriasis, unspecified: Secondary | ICD-10-CM

## 2019-01-11 DIAGNOSIS — M1A09X0 Idiopathic chronic gout, multiple sites, without tophus (tophi): Secondary | ICD-10-CM

## 2019-01-11 DIAGNOSIS — M797 Fibromyalgia: Secondary | ICD-10-CM

## 2019-01-11 DIAGNOSIS — G8929 Other chronic pain: Secondary | ICD-10-CM

## 2019-01-11 DIAGNOSIS — M79671 Pain in right foot: Secondary | ICD-10-CM

## 2019-01-11 DIAGNOSIS — M542 Cervicalgia: Secondary | ICD-10-CM

## 2019-01-11 DIAGNOSIS — M533 Sacrococcygeal disorders, not elsewhere classified: Secondary | ICD-10-CM

## 2019-01-11 DIAGNOSIS — M5412 Radiculopathy, cervical region: Secondary | ICD-10-CM | POA: Diagnosis not present

## 2019-01-11 DIAGNOSIS — Z9889 Other specified postprocedural states: Secondary | ICD-10-CM

## 2019-01-11 DIAGNOSIS — M7122 Synovial cyst of popliteal space [Baker], left knee: Secondary | ICD-10-CM

## 2019-01-11 DIAGNOSIS — M79641 Pain in right hand: Secondary | ICD-10-CM

## 2019-01-11 DIAGNOSIS — M170 Bilateral primary osteoarthritis of knee: Secondary | ICD-10-CM

## 2019-01-11 DIAGNOSIS — Z79899 Other long term (current) drug therapy: Secondary | ICD-10-CM

## 2019-01-11 DIAGNOSIS — M7062 Trochanteric bursitis, left hip: Secondary | ICD-10-CM

## 2019-01-11 DIAGNOSIS — L409 Psoriasis, unspecified: Secondary | ICD-10-CM

## 2019-01-11 DIAGNOSIS — M7061 Trochanteric bursitis, right hip: Secondary | ICD-10-CM

## 2019-01-11 DIAGNOSIS — M79642 Pain in left hand: Secondary | ICD-10-CM

## 2019-01-11 DIAGNOSIS — M5030 Other cervical disc degeneration, unspecified cervical region: Secondary | ICD-10-CM

## 2019-01-11 DIAGNOSIS — Z8679 Personal history of other diseases of the circulatory system: Secondary | ICD-10-CM

## 2019-01-11 MED ORDER — TRIAMCINOLONE ACETONIDE 40 MG/ML IJ SUSP
60.0000 mg | INTRAMUSCULAR | Status: AC | PRN
  Administered 2019-01-11: 60 mg via INTRA_ARTICULAR

## 2019-01-11 MED ORDER — LIDOCAINE HCL 1 % IJ SOLN
1.5000 mL | INTRAMUSCULAR | Status: AC | PRN
  Administered 2019-01-11: 1.5 mL

## 2019-01-11 NOTE — Telephone Encounter (Signed)
Submitted a Prior Authorization request to [employer] for TALTZ  80mg  via Cover My Meds. Will update once we receive a response.  12:22 PM Beatriz Chancellor, CPhT

## 2019-01-11 NOTE — Telephone Encounter (Signed)
Please start BIV for Taltz. He is currently on Cosentyx 150 mg every 14 days with inadequate response. Prior therapy includes: Humira (inadequate response after 5 weeks), Enbrel (inadequate response), MTX (high dose caused elevated LFT's and h/o fatty liver disease).  Taltz dose will be: For psoriatic arthritis load of 160 mg then 80 mg every 28 days

## 2019-01-11 NOTE — Progress Notes (Deleted)
Pharmacy Note  Subjective:  Patient presents today to the [facility] to see Dr. Estanislado Pandy.  Patient was seen by the pharmacist for counseling on [place] for psoriatic arthritis. He is currently on Cosentyx 150 mg every 14 days with inadequate response. Prior therapy includes: Humira (inadequate response after 5 weeks), Enbrel (inadequate response), MTX (high dose caused elevated LFT's and h/o fatty liver disease).  Objective:  CBC    Component Value Date/Time   WBC 6.0 09/22/2018 1422   RBC 4.70 09/22/2018 1422   HGB 15.2 09/22/2018 1422   HCT 45.3 09/22/2018 1422   PLT 188 09/22/2018 1422   MCV 96.4 09/22/2018 1422   MCH 32.3 09/22/2018 1422   MCHC 33.6 09/22/2018 1422   RDW 12.2 09/22/2018 1422   LYMPHSABS 2.3 09/22/2018 1422   MONOABS 0.5 09/22/2018 1422   EOSABS 0.2 09/22/2018 1422   BASOSABS 0.0 09/22/2018 1422    CMP     Component Value Date/Time   NA 137 09/22/2018 1422   K 4.1 09/22/2018 1422   CL 104 09/22/2018 1422   CO2 24 09/22/2018 1422   GLUCOSE 112 (H) 09/22/2018 1422   BUN 14 09/22/2018 1422   CREATININE 0.84 09/22/2018 1422   CREATININE 0.99 06/11/2018 0834   CALCIUM 9.0 09/22/2018 1422   PROT 7.5 06/11/2018 0834   ALBUMIN 4.7 10/24/2016 1052   AST 24 10/11/2018 1113   ALT 47 (H) 10/11/2018 1113   ALKPHOS 74 10/24/2016 1052   BILITOT 1.2 06/11/2018 0834   GFRNONAA >60 09/22/2018 1422   GFRNONAA 98 06/11/2018 0834   GFRAA >60 09/22/2018 1422   GFRAA 113 06/11/2018 0834    Baseline Immunosuppressant Therapy Labs  Quantiferon TB Gold Latest Ref Rng & Units 10/11/2018  Quantiferon TB Gold Plus NEGATIVE NEGATIVE   Hepatitis Panel: negative 10/10/2015  HIV: negative 10/10/2015  Immunoglobulins: normal 10/10/2015  SPEP: normal 10/10/2015  No results found for: G6PDH  No results found for: TPMT  Chest Xray:no active cardiopulmonary disease 10/10/2015  Does patient have a history of inflammatory bowel disease? No  Assessment/Plan:   Counseled patient that Donnetta Hail is a IL-17 inhibitor that works to reduce pain and inflammation associated with arthritis.  Counseled patient on purpose, proper use, and adverse effects of Taltz. Reviewed the most common adverse effects of infection, inflammatory bowel disease, and allergic reaction. Counseled patient that Donnetta Hail should be held for infection and prior to scheduled surgery.  Counseled patient to avoid live vaccines while on Taltz.  Advised patient to get annual influenza vaccine, pneumococcal vaccine, and Shingrix as indicated.  Reviewed storage information for Taltz.  Reviewed the importance of regular labs while on [place]. Standing orders placed and is to return in 1 month and then every 3 months after initiation.  Provided patient with medication education material and answered all questions.  Patient consented to [place].  Will upload consent into patient's chart.  Will apply for Taltz through patient's insurance and update when we receive a response.  Advised initial injection must be administered in office.  Patient voiced understanding.    Taltz dose will be: For psoriatic arthritis load of 160 mg then 80 mg every 28 days  Prescription will be sent to pharmacy pending lab results and insurance approval.

## 2019-01-11 NOTE — Progress Notes (Signed)
Pharmacy Note  Subjective:  Patient presents today to the [facility] to see Dr. Estanislado Pandy.  Patient was seen by the pharmacist for counseling on [place] for psoriatic arthritis. He is currently on Cosentyx 150 mg every 14 days with inadequate response. Prior therapy includes: Humira (inadequate response after 5 weeks), Enbrel (inadequate response), MTX (high dose caused elevated LFT's and h/o fatty liver disease).  Objective:  CBC    Component Value Date/Time   WBC 6.0 09/22/2018 1422   RBC 4.70 09/22/2018 1422   HGB 15.2 09/22/2018 1422   HCT 45.3 09/22/2018 1422   PLT 188 09/22/2018 1422   MCV 96.4 09/22/2018 1422   MCH 32.3 09/22/2018 1422   MCHC 33.6 09/22/2018 1422   RDW 12.2 09/22/2018 1422   LYMPHSABS 2.3 09/22/2018 1422   MONOABS 0.5 09/22/2018 1422   EOSABS 0.2 09/22/2018 1422   BASOSABS 0.0 09/22/2018 1422    CMP     Component Value Date/Time   NA 137 09/22/2018 1422   K 4.1 09/22/2018 1422   CL 104 09/22/2018 1422   CO2 24 09/22/2018 1422   GLUCOSE 112 (H) 09/22/2018 1422   BUN 14 09/22/2018 1422   CREATININE 0.84 09/22/2018 1422   CREATININE 0.99 06/11/2018 0834   CALCIUM 9.0 09/22/2018 1422   PROT 7.5 06/11/2018 0834   ALBUMIN 4.7 10/24/2016 1052   AST 24 10/11/2018 1113   ALT 47 (H) 10/11/2018 1113   ALKPHOS 74 10/24/2016 1052   BILITOT 1.2 06/11/2018 0834   GFRNONAA >60 09/22/2018 1422   GFRNONAA 98 06/11/2018 0834   GFRAA >60 09/22/2018 1422   GFRAA 113 06/11/2018 0834    Baseline Immunosuppressant Therapy Labs  Quantiferon TB Gold Latest Ref Rng & Units 10/11/2018  Quantiferon TB Gold Plus NEGATIVE NEGATIVE   Hepatitis Panel: negative 10/10/2015  HIV: negative 10/10/2015  Immunoglobulins: normal 10/10/2015  SPEP: normal 10/10/2015  No results found for: G6PDH  No results found for: TPMT  Chest Xray:no active cardiopulmonary disease 10/10/2015  Does patient have a history of inflammatory bowel disease? No  Assessment/Plan:   Counseled patient that Donnetta Hail is a IL-17 inhibitor that works to reduce pain and inflammation associated with arthritis.  Counseled patient on purpose, proper use, and adverse effects of Taltz. Reviewed the most common adverse effects of infection, inflammatory bowel disease, and allergic reaction. Counseled patient that Donnetta Hail should be held for infection and prior to scheduled surgery.  Counseled patient to avoid live vaccines while on Taltz.  Advised patient to get annual influenza vaccine, pneumococcal vaccine, and Shingrix as indicated.  Reviewed storage information for Taltz.  Reviewed the importance of regular labs while on [place]. Standing orders placed and is to return in 1 month and then every 3 months after initiation.  Provided patient with medication education material and answered all questions.  Patient consented to [place].  Will upload consent into patient's chart.  Will apply for Taltz through patient's insurance and update when we receive a response.  Advised initial injection must be administered in office.  Patient voiced understanding.    Taltz dose will be: For psoriatic arthritis load of 160 mg then 80 mg every 28 days  Prescription will be sent to pharmacy pending lab results and insurance approval.  All questions encouraged and answered.  Instructed patient to call with any questions or concerns.  Mariella Saa, PharmD, [place], [facility] Pharmacist [phone]  01/11/2019 11:55 AM

## 2019-01-11 NOTE — Patient Instructions (Signed)
 Hip Bursitis Rehab Ask your health care provider which exercises are safe for you. Do exercises exactly as told by your health care provider and adjust them as directed. It is normal to feel mild stretching, pulling, tightness, or discomfort as you do these exercises. Stop right away if you feel sudden pain or your pain gets worse. Do not begin these exercises until told by your health care provider. Stretching exercise This exercise warms up your muscles and joints and improves the movement and flexibility of your hip. This exercise also helps to relieve pain and stiffness. Iliotibial band stretch An iliotibial band is a strong band of muscle tissue that runs from the outer side of your hip to the outer side of your thigh and knee. 1. Lie on your side with your left / right leg in the top position. 2. Bend your left / right knee and grab your ankle. Stretch out your bottom arm to help you balance. 3. Slowly bring your knee back so your thigh is behind your body. 4. Slowly lower your knee toward the floor until you feel a gentle stretch on the outside of your left / right thigh. If you do not feel a stretch and your knee will not fall farther, place the heel of your other foot on top of your knee and pull your knee down toward the floor with your foot. 5. Hold this position for __________ seconds. 6. Slowly return to the starting position. Repeat __________ times. Complete this exercise __________ times a day. Strengthening exercises These exercises build strength and endurance in your hip and pelvis. Endurance is the ability to use your muscles for a long time, even after they get tired. Bridge This exercise strengthens the muscles that move your thigh backward (hip extensors). 1. Lie on your back on a firm surface with your knees bent and your feet flat on the floor. 2. Tighten your buttocks muscles and lift your buttocks off the floor until your trunk is level with your thighs. ? Do not  arch your back. ? You should feel the muscles working in your buttocks and the back of your thighs. If you do not feel these muscles, slide your feet 1-2 inches (2.5-5 cm) farther away from your buttocks. ? If this exercise is too easy, try doing it with your arms crossed over your chest. 3. Hold this position for __________ seconds. 4. Slowly lower your hips to the starting position. 5. Let your muscles relax completely after each repetition. Repeat __________ times. Complete this exercise __________ times a day. Squats This exercise strengthens the muscles in front of your thigh and knee (quadriceps). 1. Stand in front of a table, with your feet and knees pointing straight ahead. You may rest your hands on the table for balance but not for support. 2. Slowly bend your knees and lower your hips like you are going to sit in a chair. ? Keep your weight over your heels, not over your toes. ? Keep your lower legs upright so they are parallel with the table legs. ? Do not let your hips go lower than your knees. ? Do not bend lower than told by your health care provider. ? If your hip pain increases, do not bend as low. 3. Hold the squat position for __________ seconds. 4. Slowly push with your legs to return to standing. Do not use your hands to pull yourself to standing. Repeat __________ times. Complete this exercise __________ times a day. Hip hike 1.   Stand sideways on a bottom step. Stand on your left / right leg with your other foot unsupported next to the step. You can hold on to the railing or wall for balance if needed. 2. Keep your knees straight and your torso square. Then lift your left / right hip up toward the ceiling. 3. Hold this position for __________ seconds. 4. Slowly let your left / right hip lower toward the floor, past the starting position. Your foot should get closer to the floor. Do not lean or bend your knees. Repeat __________ times. Complete this exercise __________  times a day. Single leg stand 1. Without shoes, stand near a railing or in a doorway. You may hold on to the railing or door frame as needed for balance. 2. Squeeze your left / right buttock muscles, then lift up your other foot. ? Do not let your left / right hip push out to the side. ? It is helpful to stand in front of a mirror for this exercise so you can watch your hip. 3. Hold this position for __________ seconds. Repeat __________ times. Complete this exercise __________ times a day. This information is not intended to replace advice given to you by your health care provider. Make sure you discuss any questions you have with your health care provider. Document Released: 05/08/2004 Document Revised: 07/26/2018 Document Reviewed: 07/26/2018 Elsevier Patient Education  2020 Elsevier Inc.  

## 2019-01-12 LAB — CBC WITH DIFFERENTIAL/PLATELET
Absolute Monocytes: 601 cells/uL (ref 200–950)
Basophils Absolute: 40 cells/uL (ref 0–200)
Basophils Relative: 0.6 %
Eosinophils Absolute: 152 cells/uL (ref 15–500)
Eosinophils Relative: 2.3 %
HCT: 48.8 % (ref 38.5–50.0)
Hemoglobin: 16.5 g/dL (ref 13.2–17.1)
Lymphs Abs: 2779 cells/uL (ref 850–3900)
MCH: 31.9 pg (ref 27.0–33.0)
MCHC: 33.8 g/dL (ref 32.0–36.0)
MCV: 94.2 fL (ref 80.0–100.0)
MPV: 9.6 fL (ref 7.5–12.5)
Monocytes Relative: 9.1 %
Neutro Abs: 3029 cells/uL (ref 1500–7800)
Neutrophils Relative %: 45.9 %
Platelets: 256 10*3/uL (ref 140–400)
RBC: 5.18 10*6/uL (ref 4.20–5.80)
RDW: 13.1 % (ref 11.0–15.0)
Total Lymphocyte: 42.1 %
WBC: 6.6 10*3/uL (ref 3.8–10.8)

## 2019-01-12 LAB — COMPLETE METABOLIC PANEL WITH GFR
AG Ratio: 1.8 (calc) (ref 1.0–2.5)
ALT: 54 U/L — ABNORMAL HIGH (ref 9–46)
AST: 32 U/L (ref 10–40)
Albumin: 4.8 g/dL (ref 3.6–5.1)
Alkaline phosphatase (APISO): 74 U/L (ref 36–130)
BUN: 15 mg/dL (ref 7–25)
CO2: 23 mmol/L (ref 20–32)
Calcium: 9.6 mg/dL (ref 8.6–10.3)
Chloride: 103 mmol/L (ref 98–110)
Creat: 1.04 mg/dL (ref 0.60–1.35)
GFR, Est African American: 106 mL/min/{1.73_m2} (ref >=60)
GFR, Est Non African American: 91 mL/min/{1.73_m2} (ref >=60)
Globulin: 2.6 g/dL (calc) (ref 1.9–3.7)
Glucose, Bld: 89 mg/dL (ref 65–99)
Potassium: 4.9 mmol/L (ref 3.5–5.3)
Sodium: 138 mmol/L (ref 135–146)
Total Bilirubin: 0.7 mg/dL (ref 0.2–1.2)
Total Protein: 7.4 g/dL (ref 6.1–8.1)

## 2019-01-12 NOTE — Telephone Encounter (Signed)
Received notification from [place] regarding a prior authorization for TALTZ 80mg . Authorization has been APPROVED from 12/12/18 to 04/11/19.   Will send document to scan center.  Authorization # [id number]  Per plan patient must fill through [facility]. He has Pharmacist, community and is copay card eligible.  1:02 PM Beatriz Chancellor, CPhT

## 2019-01-12 NOTE — Progress Notes (Signed)
CBC WNL.  ALT is elevated-54.  Please advise patient to avoid NSAIDs, tylenol, and alcohol.  Rest of CMP WNL

## 2019-01-13 NOTE — Telephone Encounter (Signed)
Notified patient of approval.  His last Cosentyx injections was over 2 weeks ago.  New start Julian visit scheduled for 10/5 at [facility].

## 2019-01-13 NOTE — Progress Notes (Signed)
Pharmacy Note  Subjective:  Patient presents today to the [facility] to see Dr. Corliss Skains.  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  Prior therapy includes:methotrexate (elevated LFT's) Humira, Enbrel, Cosentyx (inadequate response).  Objective:  CBC    Component Value Date/Time   WBC 6.6 01/11/2019 1059   RBC 5.18 01/11/2019 1059   HGB 16.5 01/11/2019 1059   HCT 48.8 01/11/2019 1059   PLT 256 01/11/2019 1059   MCV 94.2 01/11/2019 1059   MCH 31.9 01/11/2019 1059   MCHC 33.8 01/11/2019 1059   RDW 13.1 01/11/2019 1059   LYMPHSABS 2,779 01/11/2019 1059   MONOABS 0.5 09/22/2018 1422   EOSABS 152 01/11/2019 1059   BASOSABS 40 01/11/2019 1059    CMP     Component Value Date/Time   NA 138 01/11/2019 1059   K 4.9 01/11/2019 1059   CL 103 01/11/2019 1059   CO2 23 01/11/2019 1059   GLUCOSE 89 01/11/2019 1059   BUN 15 01/11/2019 1059   CREATININE 1.04 01/11/2019 1059   CALCIUM 9.6 01/11/2019 1059   PROT 7.4 01/11/2019 1059   ALBUMIN 4.7 10/24/2016 1052   AST 32 01/11/2019 1059   ALT 54 (H) 01/11/2019 1059   ALKPHOS 74 10/24/2016 1052   BILITOT 0.7 01/11/2019 1059   GFRNONAA 91 01/11/2019 1059   GFRAA 106 01/11/2019 1059    Baseline Immunosuppressant Therapy Labs  Quantiferon TB Gold Latest Ref Rng & Units 10/11/2018  Quantiferon TB Gold Plus NEGATIVE NEGATIVE   Hepatitis panel: negative 10/10/2015  HIV: negative 10/10/2015  Immunoglobulins: normal 10/10/2015  SPEP: normal 10/10/2015  Chest Xray: normal 10/10/2015  Does patient have a history of inflammatory bowel disease? No  Assessment/Plan:  Counseled patient that Altamease Oiler is a IL-17 inhibitor that works to reduce pain and inflammation associated with arthritis.  Counseled patient on purpose, proper use, and adverse effects of Taltz. Reviewed the most common adverse effects of infection, inflammatory bowel disease, and allergic reaction.  Counseled patient that Altamease Oiler should be held for infection and prior to scheduled surgery.  Counseled patient to avoid live vaccines while on Taltz.  Advised patient to get annual influenza vaccine, pneumococcal vaccine, and Shingrix as indicated.  Reviewed storage information for Taltz.  Reviewed the importance of regular labs while on Taltz. Standing orders placed and is to return in 1 month and then every 3 months after initiation.  Provided patient with medication education material and answered all questions.  He was approved for [place] through insurance.  He must fill with [facility] per insurance.  He is co-pay card eligible and given information to sign up for co-pay card while in office.  Taltz dose will be: For psoriatic arthritis load of 160 mg then 80 mg every 28 days  Prescription sent to [facility].  Demonstrated proper injection technique with [employer].  Patient able to demonstrate proper injection technique using the teach back method.  Patient self injected in the right lower abdomen with:  Sample Medication: Taltz 80 mg/ml x 2 Lot: [id number] AD Expiration: 03/2019  Patient tolerated well.  Observed for 30 mins in office for adverse reaction and none noted except for mild redness and rash around injection site.  Reviewed management strategies for injection sit reactions.   His blood pressure was elevated but used normal size cuff instead of large potentially causing false elevation. He is on antihypertensives. He checks his blood pressure at home on his  wrist and it runs 140/90s.  Encouraged patient to continue to monitor at home and contact PCP if it continues to be elevated.  All questions encouraged and answered. Instructed patient to call with any questions or concerns.   Mariella Saa, PharmD, [place], [place] [facility] Special[facility] Pharmacist [phone]  01/17/2019 10:05 AM

## 2019-01-17 ENCOUNTER — Ambulatory Visit (INDEPENDENT_AMBULATORY_CARE_PROVIDER_SITE_OTHER): Payer: BC Managed Care – PPO | Admitting: Pharmacist

## 2019-01-17 ENCOUNTER — Other Ambulatory Visit: Payer: Self-pay

## 2019-01-17 VITALS — BP 164/108 | HR 96

## 2019-01-17 DIAGNOSIS — L4050 Arthropathic psoriasis, unspecified: Secondary | ICD-10-CM

## 2019-01-17 MED ORDER — TALTZ 80 MG/ML ~~LOC~~ SOAJ: 160.0000 mg | Freq: Once | SUBCUTANEOUS | 0 refills | Status: AC

## 2019-01-17 MED ORDER — TALTZ 80 MG/ML ~~LOC~~ SOAJ: 80.0000 mg | SUBCUTANEOUS | 0 refills | Status: DC

## 2019-01-17 NOTE — Patient Instructions (Addendum)
Your next dose of Donnetta Hail will be due 02/14/2019 and then every 28 days.  Standing Labs We placed an order today for your standing lab work.    Please come back and get your standing labs in 1 month and 3 months.  We have open lab daily Monday through Thursday from 8:30-12:30 PM and 1:30-4:30 PM and Friday from 8:30-12:30 PM and 1:30-4:00 PM at the office of Dr. Bo Merino.   You may experience shorter wait times on Monday and Friday afternoons. The office is located at [address], [place], [place], [place] [address] No appointment is necessary.   Labs are drawn by [facility].  You may receive a bill from [facility] for your lab work.  If you wish to have your labs drawn at another location, please call the office 24 hours in advance to send orders.  If you have any questions regarding directions or hours of operation,  please call [phone].   Just as a reminder please drink plenty of water prior to coming for your lab work. Thanks!  Helpful Tips for Injecting To help alleviate pain and injection site reactions consider the following tips:  . Placing something cold (like and ice gel pack or cold water bottle) on the injection site just before cleansing with alcohol may help reduce pain . If you have a localized reaction (redness, mild swelling, warmth, and itching) you can use topical corticosteroids (hydrocortisone cream) or antihistamine (Claritin) to help minimize reaction the day of, the day before, and the day after injecting . Always inject this medication at room temperature (remove from refrigerator 15-20 minutes before injecting; this may help eliminate stinging). . Always rotate your injection sites using inside/outside thigh of both legs, abdomen divided into 4 quadrants (stay away from waist line, and 2" away from navel). . Do not inject into areas where skin is tender, bruised, red, or hard or where there are scars or stretch marks. . Always let alcohol dry on the skin  before injecting. . Place a cold, damp towel or small ice pack on the injection site for 10 or 15 minutes every 1 to 2 hours if it hurts or is swollen.

## 2019-01-28 ENCOUNTER — Ambulatory Visit (HOSPITAL_COMMUNITY)
Admission: RE | Admit: 2019-01-28 | Discharge: 2019-01-28 | Disposition: A | Discharge status: Home / Self Care | Payer: BC Managed Care – PPO | Source: Ambulatory Visit | Attending: Rheumatology | Admitting: Rheumatology

## 2019-01-28 ENCOUNTER — Other Ambulatory Visit: Payer: Self-pay

## 2019-01-28 DIAGNOSIS — M5412 Radiculopathy, cervical region: Secondary | ICD-10-CM | POA: Diagnosis present

## 2019-01-28 DIAGNOSIS — M542 Cervicalgia: Secondary | ICD-10-CM | POA: Insufficient documentation

## 2019-01-31 ENCOUNTER — Telehealth: Payer: Self-pay | Admitting: *Deleted

## 2019-01-31 DIAGNOSIS — M542 Cervicalgia: Secondary | ICD-10-CM

## 2019-01-31 DIAGNOSIS — M5412 Radiculopathy, cervical region: Secondary | ICD-10-CM

## 2019-01-31 NOTE — Progress Notes (Signed)
I discussed the MRI findings with the patient.  He was in agreement with the referral to Dr. Ernestina Patches.  Please make the referral.

## 2019-01-31 NOTE — Telephone Encounter (Signed)
-----   Message from Bo Merino, MD sent at 01/31/2019  1:22 PM EDT ----- I discussed the MRI findings with the patient.  He was in agreement with the referral to Dr. Ernestina Patches.  Please make the referral.

## 2019-01-31 NOTE — Progress Notes (Signed)
Office Visit Note  Patient: Miguel Evans             Date of Birth: Oct 11, 1980           MRN: [id number]             PCP: Enid Skeens., MD Referring: Enid Skeens., MD Visit Date: 02/14/2019 Occupation: @GUAROCC @  Subjective:  Neck pain with left-sided radiculopathy.   History of Present Illness: Miguel Evans is a 38 y.o. male with history of psoriasis and psoriatic arthropathy.  He was switched from Cosentyx to [place] in October.  He states after starting Toltz in 3 weeks he started feeling remarkably better.  He does feel mild stiffness prior to his next shot.  He states he has been having neck pain radiating to his left shoulder which is not getting any better.  It has been going on for at least 1 week now.  He denies any joint pain or joint swelling.  He had been having some stiffness with weather change.  Activities of Daily Living:  Patient reports morning stiffness for 1 hour.   Patient Reports nocturnal pain.  Difficulty dressing/grooming: Reports Difficulty climbing stairs: Reports still feels some weakness in his knee joints. Difficulty getting out of chair: Reports,  knee joint weakness. Difficulty using hands for taps, buttons, cutlery, and/or writing: denies  Review of Systems  Constitutional: Negative for fatigue and night sweats.  HENT: Negative for mouth sores, mouth dryness and nose dryness.   Eyes: Negative for redness and dryness.  Respiratory: Negative for shortness of breath and difficulty breathing.   Cardiovascular: Negative for chest pain, palpitations, hypertension, irregular heartbeat and swelling in legs/feet.  Gastrointestinal: Negative for constipation and diarrhea.  Endocrine: Positive for heat intolerance. Negative for increased urination.  Genitourinary: Negative for difficulty urinating.  Musculoskeletal: Positive for arthralgias, joint pain, morning stiffness and muscle tenderness. Negative for gait problem, joint swelling, myalgias, muscle  weakness and myalgias.  Skin: Positive for rash. Negative for color change, hair loss, nodules/bumps, skin tightness, ulcers and sensitivity to sunlight.  Allergic/Immunologic: Negative for susceptible to infections.  Neurological: Positive for numbness. Negative for dizziness, fainting, memory loss, night sweats and weakness.  Hematological: Negative for bruising/bleeding tendency and swollen glands.  Psychiatric/Behavioral: Negative for depressed mood and sleep disturbance. The patient is not nervous/anxious.     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    Past Medical History:  Diagnosis Date   Anxiety    Gout    Hypertension     Family History  Problem Relation Age of Onset   Cancer Mother        breast    Bipolar disorder Mother    Hypertension Father    Osteoarthritis Father    Emphysema Maternal Grandfather    CAD Paternal Grandfather    Healthy Son    Healthy Son    Healthy Daughter    Healthy Daughter    Past Surgical History:  Procedure Laterality Date   ANTERIOR CRUCIATE LIGAMENT REPAIR Bilateral Left in  2002, Right 2013   ANTERIOR CRUCIATE LIGAMENT REPAIR Bilateral 2014   SHOULDER ARTHROSCOPY WITH SUBACROMIAL DECOMPRESSION AND OPEN ROTATOR C Right 09/23/2018   Procedure: SHOULDER ARTHROSCOPY WITH SUBACROMIAL DECOMPRESSION AND OPEN ROTATOR CUFF REPAIR,;  Surgeon: Valeria Batman, MD;  Location: [facility];  Service: Orthopedics;  Laterality: Right;  WITH BLOCK   VASECTOMY     Social  History   Social History Narrative   Not on file    There is no immunization history on file for this patient.   Objective: Vital Signs: BP (!) 155/83 (BP Location: Left Arm, Patient Position: Sitting, Cuff Size: Large)    Pulse 89    Resp 16    Ht  (1.905 m)    Wt (!) 337 lb 3.2 oz (153 kg)    BMI 42.15 kg/m    Physical Exam Vitals signs and nursing note reviewed.  Constitutional:      Appearance: He is well-developed.  HENT:     Head: Normocephalic and atraumatic.  Eyes:     Conjunctiva/sclera: Conjunctivae normal.     Pupils: Pupils are equal, round, and reactive to light.  Neck:     Musculoskeletal: Normal range of motion and neck supple.  Cardiovascular:     Rate and Rhythm: Normal rate and regular rhythm.     Heart sounds: Normal heart sounds.  Pulmonary:     Effort: Pulmonary effort is normal.     Breath sounds: Normal breath sounds.  Abdominal:     General: Bowel sounds are normal.     Palpations: Abdomen is soft.  Skin:    General: Skin is warm and dry.     Capillary Refill: Capillary refill takes less than 2 seconds.     Comments: Psoriasis patch on his abdomen  Neurological:     Mental Status: He is alert and oriented to person, place, and time.  Psychiatric:        Behavior: Behavior normal.      Musculoskeletal Exam: Patient is discomfort in his C-spine especially with lateral rotation.  He also has some discomfort range of motion of his left shoulder.  Elbow joints, wrist joints, MCPs, PIPs and DIPs with good range of motion with no synovitis.  Hip joints and knee joints were in good range of motion without any warmth or swelling.  He had good range of motion of his ankle joints.  There was no evidence of Achilles tendinitis or plantar fasciitis.  CDAI Exam: CDAI Score: 0.6  Patient Global: 3 mm; Provider Global: 3 mm Swollen: 0 ; Tender: 1  Joint Exam      Right  Left  Cervical Spine   Tender        Investigation: No additional  findings.  Imaging: Mr Cervical Spine Wo Contrast  Result Date: 01/28/2019 CLINICAL DATA:  Neck pain radiating to the left arm EXAM: MRI CERVICAL SPINE WITHOUT CONTRAST TECHNIQUE: Multiplanar, multisequence MR imaging of the cervical spine was performed. No intravenous contrast was administered. COMPARISON:  Radiography 01/11/2019 FINDINGS: Alignment: Straightening of the normal cervical lordosis. Vertebrae: No fracture or primary bone lesion. Cord: No cord compression or primary cord lesion. Posterior Fossa, vertebral arteries, paraspinal tissues: Negative Disc levels: Foramen magnum, C1-2 and C2-3 are normal. C3-4: Minimal disc bulge.  No canal or foraminal stenosis. C4-5: Mild left-sided uncovertebral degenerative change. No canal stenosis. Mild left foraminal narrowing. Some potential this could affect the left C5 nerve. C5-6: Normal interspace. C6-7: Spondylosis with endplate osteophytes and bulging of  the disc. Narrowing of the ventral subarachnoid space but no compression of the cord. AP diameter of the canal in the midline 8 mm. Bilateral foraminal encroachment that could affect either C7 nerve. C7-T1: Normal interspace. T1-2 and T2-3: Noncompressive disc bulges. IMPRESSION: Degenerative changes throughout the cervical and upper thoracic region as outlined above. No compressive central canal stenosis. Bilateral foraminal narrowing at C6-7 that would have some potential to affect either C7 nerve. Lesser foraminal narrowing on the left at C4-5 due to uncovertebral encroachment could possibly affect the left C5 nerve. Electronically Signed   By: Paulina FusiMark  Shogry M.D.   On: 01/28/2019 20:19    Recent Labs: Lab Results  Component Value Date   WBC 6.6 01/11/2019   HGB 16.5 01/11/2019   PLT 256 01/11/2019   NA 138 01/11/2019   K 4.9 01/11/2019   CL 103 01/11/2019   CO2 23 01/11/2019   GLUCOSE 89 01/11/2019   BUN 15 01/11/2019   CREATININE 1.04 01/11/2019   BILITOT 0.7 01/11/2019   ALKPHOS 74  10/24/2016   AST 32 01/11/2019   ALT 54 (H) 01/11/2019   PROT 7.4 01/11/2019   ALBUMIN 4.7 10/24/2016   CALCIUM 9.6 01/11/2019   GFRAA 106 01/11/2019   QFTBGOLDPLUS NEGATIVE 10/11/2018    Speciality Comments: No specialty comments available.  Procedures:  No procedures performed Allergies: Patient has no known allergies.   Assessment / Plan:     Visit Diagnoses: Psoriatic arthritis ([facility]) -patient is doing much better on Taltz and methotrexate combination.  He states he started feeling better after taking Taltz for 3 weeks.  Although he does have some discomfort prior to his next shot.  He continues to have some stiffness.  Plan: Sedimentation rate  Psoriasis-he had only one psoriasis patch on his abdominal region.  High risk medication use - Taltz 80 mg every 28 days started in October 2020.  Methotrexate 0.8 mL subcu weekly, folic acid 2 mg p.o. daily last TB gold negative on 10/11/2018 and will monitor yearly.  Most recent CBC/CMP within normal limits except for elevate ALT on 01/11/2019.  Due for CBC/CMP today to monitor for drug toxicity and will monitor every 3 months. inadequate response to Enbrel, Humira, and Cosentyx. - Plan: CBC with Differential/Platelet, COMPLETE METABOLIC PANEL WITH GFR  Primary osteoarthritis of both knees-he continues to have some chronic discomfort in his knee joints which causes discomfort with climbing stairs and getting up from the chair.  Weight loss diet and exercise was emphasized.  Synovial cyst of left popliteal space - 01/11/2019: 23 mL of clear fluid was aspirated from the popliteal cyst.   Idiopathic chronic gout of multiple sites without tophus - allopurinol 300 mg by mouth daily and Mitigare 0.6 mg daily.uric acid: 10/11/2018 6.6.  He denies any gout flares.  We will check uric acid level today.  DDD (degenerative disc disease), cervical-MRIs findings were discussed with the patient.  Cervical radiculopathy-he has appointment with Dr. Alvester MorinNewton  this week.  We will see response to the injection.  Chronic left SI joint pain-improved  Fibromyalgia-he has some generalized discomfort.  S/P right rotator cuff repair - Performed by Dr. Cleophas DunkerWhitfield in June 2020.  Chronic discomfort.  He is also complaining of left shoulder joint pain.  I am uncertain if it is coming from his cervical spine.  If it persist after the cervical spine injection we may consider injecting his left shoulder in the future.  History of hypertension-his blood pressure is a still elevated.  Have  advised him to monitor blood pressure closely and follow-up with PCP.  BMI 42.15-weight loss diet and exercise was emphasized.  It should help his psoriatic arthritis and gout as well.  Association of heart disease with psoriatic arthritis was discussed. Need to monitor blood pressure, cholesterol, and to exercise 30-60 minutes on daily basis was discussed.   Orders: Orders Placed This Encounter  Procedures   CBC with Differential/Platelet   COMPLETE METABOLIC PANEL WITH GFR   Sedimentation rate   Uric acid   No orders of the defined types were placed in this encounter.   Face-to-face time spent with patient was 30 minutes. Greater than 50% of time was spent in counseling and coordination of care.  Follow-Up Instructions: Return in about 4 weeks (around 03/14/2019) for Psoriatic arthritis.   Pollyann Savoy, MD  Note - This record has been created using [employer].  Chart creation errors have been sought, but may not always  have been located. Such creation errors do not reflect on  the standard of medical care.

## 2019-02-08 ENCOUNTER — Ambulatory Visit: Payer: BC Managed Care – PPO | Admitting: Rheumatology

## 2019-02-08 ENCOUNTER — Other Ambulatory Visit: Payer: Self-pay | Admitting: Rheumatology

## 2019-02-08 NOTE — Telephone Encounter (Addendum)
Last Visit: 01/11/19  Next Visit: 02/14/19 Labs: 01/11/19 CBC WNL. ALT is elevated-54. Rest of CMP WNL  Patient in office on 01/11/19 and started on [place] on 01/17/19. Office note on 01/17/19 does note address if patient is to continue or discontinue MTX. Please advised and verify dose. Thanks!

## 2019-02-09 NOTE — Telephone Encounter (Signed)
He will need to update labs on 02/14/19 then we can determine the appropriate dose of MTX.   Office visit note from 01/11/19 recommended continuing MTX but I would like to see his LFTs prior to increasing the dose of MTX

## 2019-02-14 ENCOUNTER — Encounter: Payer: Self-pay | Admitting: Physician Assistant

## 2019-02-14 ENCOUNTER — Other Ambulatory Visit: Payer: Self-pay

## 2019-02-14 ENCOUNTER — Encounter: Payer: Self-pay | Admitting: Rheumatology

## 2019-02-14 ENCOUNTER — Ambulatory Visit: Payer: BC Managed Care – PPO | Admitting: Rheumatology

## 2019-02-14 VITALS — BP 155/83 | HR 89 | Resp 16 | Ht 75.0 in | Wt 337.2 lb

## 2019-02-14 DIAGNOSIS — Z79899 Other long term (current) drug therapy: Secondary | ICD-10-CM

## 2019-02-14 DIAGNOSIS — L409 Psoriasis, unspecified: Secondary | ICD-10-CM

## 2019-02-14 DIAGNOSIS — M533 Sacrococcygeal disorders, not elsewhere classified: Secondary | ICD-10-CM

## 2019-02-14 DIAGNOSIS — G8929 Other chronic pain: Secondary | ICD-10-CM

## 2019-02-14 DIAGNOSIS — M170 Bilateral primary osteoarthritis of knee: Secondary | ICD-10-CM | POA: Diagnosis not present

## 2019-02-14 DIAGNOSIS — Z6841 Body Mass Index (BMI) 40.0 and over, adult: Secondary | ICD-10-CM

## 2019-02-14 DIAGNOSIS — Z8679 Personal history of other diseases of the circulatory system: Secondary | ICD-10-CM

## 2019-02-14 DIAGNOSIS — M5412 Radiculopathy, cervical region: Secondary | ICD-10-CM

## 2019-02-14 DIAGNOSIS — M1A09X0 Idiopathic chronic gout, multiple sites, without tophus (tophi): Secondary | ICD-10-CM

## 2019-02-14 DIAGNOSIS — M5030 Other cervical disc degeneration, unspecified cervical region: Secondary | ICD-10-CM

## 2019-02-14 DIAGNOSIS — M797 Fibromyalgia: Secondary | ICD-10-CM

## 2019-02-14 DIAGNOSIS — M7122 Synovial cyst of popliteal space [Baker], left knee: Secondary | ICD-10-CM

## 2019-02-14 DIAGNOSIS — L4050 Arthropathic psoriasis, unspecified: Secondary | ICD-10-CM | POA: Diagnosis not present

## 2019-02-14 DIAGNOSIS — Z9889 Other specified postprocedural states: Secondary | ICD-10-CM

## 2019-02-14 NOTE — Patient Instructions (Addendum)
Standing Labs We placed an order today for your standing lab work.    Please come back and get your standing labs in February and every 3 months  We have open lab daily Monday through Thursday from 8:30-12:30 PM and 1:30-4:30 PM and Friday from 8:30-12:30 PM and 1:30-4:00 PM at the office of Dr. Bo Merino.   You may experience shorter wait times on Monday and Friday afternoons. The office is located at [address], [place], [place], [place] [address] No appointment is necessary.   Labs are drawn by [facility].  You may receive a bill from [facility] for your lab work.  If you wish to have your labs drawn at another location, please call the office 24 hours in advance to send orders.  If you have any questions regarding directions or hours of operation,  please call [phone].   Just as a reminder please drink plenty of water prior to coming for your lab work. Thanks!   Association of heart disease with psoriatic arthritis was discussed. Need to monitor blood pressure, cholesterol, and to exercise 30-60 minutes on daily basis was discussed.

## 2019-02-15 LAB — COMPLETE METABOLIC PANEL WITH GFR
AG Ratio: 2 (calc) (ref 1.0–2.5)
ALT: 51 U/L — ABNORMAL HIGH (ref 9–46)
AST: 26 U/L (ref 10–40)
Albumin: 4.7 g/dL (ref 3.6–5.1)
Alkaline phosphatase (APISO): 62 U/L (ref 36–130)
BUN: 16 mg/dL (ref 7–25)
CO2: 26 mmol/L (ref 20–32)
Calcium: 9.3 mg/dL (ref 8.6–10.3)
Chloride: 106 mmol/L (ref 98–110)
Creat: 1.01 mg/dL (ref 0.60–1.35)
GFR, Est African American: 110 mL/min/{1.73_m2} (ref >=60)
GFR, Est Non African American: 95 mL/min/{1.73_m2} (ref >=60)
Globulin: 2.4 g/dL (calc) (ref 1.9–3.7)
Glucose, Bld: 94 mg/dL (ref 65–99)
Potassium: 4.9 mmol/L (ref 3.5–5.3)
Sodium: 142 mmol/L (ref 135–146)
Total Bilirubin: 0.9 mg/dL (ref 0.2–1.2)
Total Protein: 7.1 g/dL (ref 6.1–8.1)

## 2019-02-15 LAB — SEDIMENTATION RATE: Sed Rate: 2 mm/h (ref 0–15)

## 2019-02-15 LAB — CBC WITH DIFFERENTIAL/PLATELET
Absolute Monocytes: 673 cells/uL (ref 200–950)
Basophils Absolute: 29 cells/uL (ref 0–200)
Basophils Relative: 0.5 %
Eosinophils Absolute: 120 cells/uL (ref 15–500)
Eosinophils Relative: 2.1 %
HCT: 44.5 % (ref 38.5–50.0)
Hemoglobin: 15.2 g/dL (ref 13.2–17.1)
Lymphs Abs: 2468 cells/uL (ref 850–3900)
MCH: 32 pg (ref 27.0–33.0)
MCHC: 34.2 g/dL (ref 32.0–36.0)
MCV: 93.7 fL (ref 80.0–100.0)
MPV: 9.7 fL (ref 7.5–12.5)
Monocytes Relative: 11.8 %
Neutro Abs: 2411 cells/uL (ref 1500–7800)
Neutrophils Relative %: 42.3 %
Platelets: 215 10*3/uL (ref 140–400)
RBC: 4.75 10*6/uL (ref 4.20–5.80)
RDW: 12.7 % (ref 11.0–15.0)
Total Lymphocyte: 43.3 %
WBC: 5.7 10*3/uL (ref 3.8–10.8)

## 2019-02-15 LAB — URIC ACID: Uric Acid, Serum: 7.2 mg/dL (ref 4.0–8.0)

## 2019-02-15 NOTE — Progress Notes (Signed)
Uric acid was greater than 6 the patient is not having any flares.  Please advise dietary modifications with low purine diet.  LFTs are elevated but is stable.

## 2019-02-21 ENCOUNTER — Encounter: Payer: Self-pay | Admitting: Rheumatology

## 2019-02-21 MED ORDER — METHOTREXATE SODIUM CHEMO INJECTION 50 MG/2ML: 20.0000 mg | INTRAMUSCULAR | 0 refills | Status: DC

## 2019-02-21 NOTE — Telephone Encounter (Signed)
Last Visit: 02/14/19 Next Visit: 03/16/19 Labs: 02/14/19 LFTs are elevated but is stable.

## 2019-02-22 ENCOUNTER — Ambulatory Visit: Payer: BC Managed Care – PPO | Admitting: Physical Medicine and Rehabilitation

## 2019-02-22 ENCOUNTER — Telehealth: Payer: Self-pay | Admitting: Physical Medicine and Rehabilitation

## 2019-02-22 ENCOUNTER — Encounter: Payer: Self-pay | Admitting: Neurology

## 2019-02-22 ENCOUNTER — Other Ambulatory Visit: Payer: Self-pay | Admitting: Physical Medicine and Rehabilitation

## 2019-02-22 ENCOUNTER — Encounter: Payer: Self-pay | Admitting: Physical Medicine and Rehabilitation

## 2019-02-22 ENCOUNTER — Other Ambulatory Visit: Payer: Self-pay

## 2019-02-22 VITALS — BP 144/99 | HR 95

## 2019-02-22 DIAGNOSIS — M5010 Cervical disc disorder with radiculopathy, unspecified cervical region: Secondary | ICD-10-CM

## 2019-02-22 DIAGNOSIS — M7918 Myalgia, other site: Secondary | ICD-10-CM | POA: Diagnosis not present

## 2019-02-22 DIAGNOSIS — R202 Paresthesia of skin: Secondary | ICD-10-CM

## 2019-02-22 DIAGNOSIS — M542 Cervicalgia: Secondary | ICD-10-CM

## 2019-02-22 DIAGNOSIS — M5412 Radiculopathy, cervical region: Secondary | ICD-10-CM

## 2019-02-22 DIAGNOSIS — F419 Anxiety disorder, unspecified: Secondary | ICD-10-CM

## 2019-02-22 MED ORDER — DIAZEPAM 5 MG PO TABS: ORAL_TABLET | ORAL | 0 refills | Status: DC

## 2019-02-22 NOTE — Progress Notes (Signed)
  Numeric Pain Rating Scale and Functional Assessment Average Pain 8 Pain Right Now 7 My pain is constant, sharp, stabbing and tingling Pain is worse with: unsure Pain improves with: Nothing   In the last MONTH (on 0-10 scale) has pain interfered with the following?  1. General activity like being  able to carry out your everyday physical activities such as walking, climbing stairs, carrying groceries, or moving a chair?  Rating(8)  2. Relation with others like being able to carry out your usual social activities and roles such as  activities at home, at work and in your community. Rating(8)  3. Enjoyment of life such that you have  been bothered by emotional problems such as feeling anxious, depressed or irritable?  Rating(10)

## 2019-02-22 NOTE — Progress Notes (Signed)
Miguel Evans - 38 y.o. male MRN [id number]  Date of birth: 05-19-1980  Office Visit Note: Visit Date: 02/22/2019 PCP: Nonnie Done., MD Referred by: Nonnie Done., MD  Subjective: Chief Complaint  Patient presents with   Neck - Pain   Left Shoulder - Pain   Left Hand - Pain, Numbness   HPI: Miguel Evans is a 38 y.o. male who comes in today At the request of Dr. Melvenia Needles for new patient evaluation management of chronic worsening left-sided neck and shoulder pain with numbness and tingling in the left hand and arm.  Patient's history complicated by psoriatic arthritis.  He relates to me that he has been having pain ever since he was in high school with his neck.  He reports a big hit during football where he had pain on the left side into the shoulder and down into the mid back and ever since that time he feels like his neck is been an issue.  He reports that he feels like he has never had any Dr. Doreene Adas treated seriously.  He reports at least 10 years of numbness and tingling in the arm particularly in the ulnar 2 digits on the left hand.  He feels like he is weak in the left hand.  He is right-hand dominant.  He reports the pain in the neck is constant sharp stabbing and then the tingling in the hand.  He reports his neck feels like somebody has him in a vice grip and will let go when it builds up pressure.  His descriptions are very descriptive and emotional laden.  He is really telling me he is unable to get any relief at all.  He rates his pain as an 8 out of 10.  He says he has a heavy feeling in the left upper arm.  Dr. Corliss Skains did obtain MRI of the cervical spine and this is reviewed below.  He has had no prior cervical or lumbar surgery.  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  He reports worsening pain with activity but constant pain in general.  He gets some referral pattern up into the occiput and somewhat more of a  myofascial or myotomal distribution pattern.  No right-sided complaints.  No specific associated headaches.  Does have a known diagnosis of anxiety.  He has had rotator cuff shoulder surgery and repair as well as bilateral ACL reconstructions.  Review of Systems  Constitutional: Negative for chills, fever, malaise/fatigue and weight loss.  HENT: Negative for hearing loss and sinus pain.   Eyes: Negative for blurred vision, double vision and photophobia.  Respiratory: Negative for cough and shortness of breath.   Cardiovascular: Negative for chest pain, palpitations and leg swelling.  Gastrointestinal: Negative for abdominal pain, nausea and vomiting.  Genitourinary: Negative for flank pain.  Musculoskeletal: Positive for joint pain and neck pain. Negative for myalgias.  Skin: Negative for itching and rash.  Neurological: Positive for tingling. Negative for tremors, focal weakness and weakness.  Endo/Heme/Allergies: Negative.   Psychiatric/Behavioral: Negative for depression.  All other systems reviewed and are negative.  Otherwise per HPI.  Assessment & Plan: Visit Diagnoses:  1. Cervical radiculopathy   2. Cervical disc disorder with radiculopathy   3. Cervicalgia   4. Myofascial pain syndrome   5. Anxiety   6. Paresthesia of skin     Plan: Findings:  Chronic worsening left-sided neck pain with really  overall bilateral neck pain with some referral to the left trapezius area and shoulder.  I feel like this is a combination of findings with a radiculopathy radiculitis but also myofascial pain syndrome.  Review the MRI does show left-sided findings at C6-7 and that could cause a C8 radiculitis.  There is no focal compression.  Interestingly on exam there is no atrophy left compared to right arm.  He says that he has no sensation to light touch in the fourth and fifth digits on the left.  He does have some elbow pain in general.  I feel like the left elbow and left hand could be ulnar  neuropathy but again he has pretty profound numbness subjectively without much atrophy.  Given that he really has had full conservative care with multiple bouts of physical therapy and chiropractic care and medication management I do think it is pertinent at this point to try a diagnostic and hopefully therapeutic epidural injection.  We will premedicate with Valium.  I also want to get an electrodiagnostic study of the left upper limb.  I would like for this to be done by Dr. Nita Sickleonika Patel at [facility].  We can do that here as well but sometimes in these cases when I am seeing the patient I like to have a different person do the test so the least it is not a biased test in any way in terms of trying to figure out where this is coming from.  If they cannot do it in a timely fashion we would get them done here.  He is already taking duloxetine as well as Xanax and he will continue to take that.  He is on methotrexate.  Medication prescribed by Dr. Corliss Skainseveshwar should be beneficial in this situation as well defer that to her unless for something specific.    Meds & Orders:  Meds ordered this encounter  Medications   diazepam (VALIUM) 5 MG tablet    Sig: Take 1 by mouth 1 hour  pre-procedure with very light food. May bring 2nd tablet to appointment.    Dispense:  2 tablet    Refill:  0    Orders Placed This Encounter  Procedures   Ambulatory referral to Neurology    Follow-up: Return for C7-T1 interlaminar epidural steroid injection.   Procedures: No procedures performed  No notes on file   Clinical History: MRI CERVICAL SPINE WITHOUT CONTRAST  TECHNIQUE: Multiplanar, multisequence MR imaging of the cervical spine was performed. No intravenous contrast was administered.  COMPARISON:  Radiography 01/11/2019  FINDINGS: Alignment: Straightening of the normal cervical lordosis.  Vertebrae: No fracture or primary bone lesion.  Cord: No cord compression or primary cord  lesion.  Posterior Fossa, vertebral arteries, paraspinal tissues: Negative  Disc levels:  Foramen magnum, C1-2 and C2-3 are normal.  C3-4: Minimal disc bulge.  No canal or foraminal stenosis.  C4-5: Mild left-sided uncovertebral degenerative change. No canal stenosis. Mild left foraminal narrowing. Some potential this could affect the left C5 nerve.  C5-6: Normal interspace.  C6-7: Spondylosis with endplate osteophytes and bulging of the disc. Narrowing of the ventral subarachnoid space but no compression of the cord. AP diameter of the canal in the midline 8 mm. Bilateral foraminal encroachment that could affect either C7 nerve.  C7-T1: Normal interspace.  T1-2 and T2-3: Noncompressive disc bulges.  IMPRESSION: Degenerative changes throughout the cervical and upper thoracic region as outlined above. No compressive central canal stenosis. Bilateral foraminal narrowing at C6-7 that would have some  potential to affect either C7 nerve. Lesser foraminal narrowing on the left at C4-5 due to uncovertebral encroachment could possibly affect the left C5 nerve.   Electronically Signed   By: Paulina Fusi M.D.   On: 01/28/2019 20:19   He reports that he quit smoking about 6 years ago. His smoking use included cigarettes. He has a 1.25 pack-year smoking history. He has quit using smokeless tobacco.  Recent Labs    10/11/18 1113 02/14/19 1108  LABURIC 6.6 7.2    Objective:  VS:  HT:     WT:    BMI:      BP:(!) 144/99   HR:95bpm   TEMP: ( )   RESP:  Physical Exam Vitals signs and nursing note reviewed.  Constitutional:      General: He is not in acute distress.    Appearance: He is well-developed. He is obese.  HENT:     Head: Normocephalic and atraumatic.     Nose: Nose normal.     Mouth/Throat:     Mouth: Mucous membranes are moist.     Pharynx: Oropharynx is clear.  Eyes:     Conjunctiva/sclera: Conjunctivae normal.     Pupils: Pupils are equal, round, and  reactive to light.  Neck:     Musculoskeletal: Normal range of motion and neck supple.     Trachea: No tracheal deviation.  Cardiovascular:     Rate and Rhythm: Normal rate and regular rhythm.     Pulses: Normal pulses.  Pulmonary:     Effort: Pulmonary effort is normal.     Breath sounds: Normal breath sounds.  Abdominal:     General: There is no distension.     Palpations: Abdomen is soft.     Tenderness: There is no guarding or rebound.  Musculoskeletal:        General: No deformity.     Right lower leg: No edema.     Left lower leg: No edema.     Comments: Patient is a very large fairly muscular individual.  He has trigger points in the levator scapular trapezius rhomboid area on the left some on the right.  This reproduces some of his pain.  He has mild shoulder impingement on the left.  He has good strength bilaterally without any deficits.  There is no perceived atrophy or loss of muscle bulk left or right particularly in the hands or arm.  Subjectively he has what he says is no sensation in the somewhat more ulnar distribution of the fourth and fifth digits on the left.  He has a negative Tinel's although he does have pain over the tendon insertions.  He has an equivocal Froment's on the left but is very mild.  He has a negative Hoffmann's bilaterally.  Skin:    General: Skin is warm and dry.     Findings: No erythema or rash.  Neurological:     General: No focal deficit present.     Mental Status: He is alert and oriented to person, place, and time.     Sensory: Sensory deficit present.     Motor: No abnormal muscle tone.     Coordination: Coordination normal.     Gait: Gait normal.  Psychiatric:        Mood and Affect: Mood normal.        Behavior: Behavior normal.        Thought Content: Thought content normal.     Ortho Exam Imaging: No results found.  Past Medical/Family/Surgical/Social History: Medications & Allergies reviewed per EMR, new medications  updated. 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   Past Medical History:  Diagnosis Date   Anxiety    Gout    Hypertension    Family History  Problem Relation Age of Onset   Cancer Mother        breast    Bipolar disorder Mother    Hypertension Father    Osteoarthritis Father    Emphysema Maternal Grandfather    CAD Paternal Grandfather    Healthy Son    Healthy Son    Healthy Daughter    Healthy Daughter    Past Surgical History:  Procedure Laterality Date   ANTERIOR CRUCIATE LIGAMENT REPAIR Bilateral Left in 2002, Right 2013   ANTERIOR CRUCIATE LIGAMENT REPAIR Bilateral 2014   SHOULDER ARTHROSCOPY WITH SUBACROMIAL DECOMPRESSION AND OPEN ROTATOR C Right 09/23/2018   Procedure: SHOULDER ARTHROSCOPY WITH SUBACROMIAL DECOMPRESSION AND OPEN ROTATOR CUFF REPAIR,;  Surgeon: Garald Balding, MD;  Location: [facility];  Service: Orthopedics;  Laterality: Right;  WITH BLOCK   VASECTOMY     Social History   Occupational History   Not on file  Tobacco Use   Smoking status: Former Smoker    Packs/day: 0.25    Years: 5.00    Pack years: 1.25    Types: Cigarettes    Quit date: 03/27/2012    Years since quitting: 6.9   Smokeless tobacco: Former Systems developer  Substance and Sexual Activity   Alcohol use: Yes    Alcohol/week: 2.0 standard drinks    Types: 2 Cans of beer per week   Drug use: No   Sexual activity: Yes    Birth  control/protection: Condom

## 2019-02-22 NOTE — Telephone Encounter (Signed)
done

## 2019-02-23 ENCOUNTER — Encounter: Payer: Self-pay | Admitting: *Deleted

## 2019-02-23 ENCOUNTER — Encounter: Payer: Self-pay | Admitting: Physical Medicine and Rehabilitation

## 2019-02-23 NOTE — Telephone Encounter (Signed)
Spoke with Hassie Bruce a clinical nurse and she reviewed PA and granted approval.  [phone] Dates 02/23/2019-03/08/2019

## 2019-03-02 ENCOUNTER — Encounter: Payer: Self-pay | Admitting: Physical Medicine and Rehabilitation

## 2019-03-02 ENCOUNTER — Ambulatory Visit: Payer: BC Managed Care – PPO | Admitting: Physical Medicine and Rehabilitation

## 2019-03-02 ENCOUNTER — Other Ambulatory Visit: Payer: Self-pay

## 2019-03-02 ENCOUNTER — Ambulatory Visit: Payer: Self-pay

## 2019-03-02 VITALS — BP 136/104 | HR 99

## 2019-03-02 DIAGNOSIS — M5010 Cervical disc disorder with radiculopathy, unspecified cervical region: Secondary | ICD-10-CM | POA: Diagnosis not present

## 2019-03-02 DIAGNOSIS — M5412 Radiculopathy, cervical region: Secondary | ICD-10-CM

## 2019-03-02 MED ORDER — METHYLPREDNISOLONE ACETATE 80 MG/ML IJ SUSP
80.0000 mg | Freq: Once | INTRAMUSCULAR | Status: AC
  Administered 2019-03-02: 15:00:00 80 mg

## 2019-03-02 NOTE — Progress Notes (Signed)
Numeric Pain Rating Scale and Functional Assessment Average Pain 6   In the last MONTH (on 0-10 scale) has pain interfered with the following?  1. General activity like being  able to carry out your everyday physical activities such as walking, climbing stairs, carrying groceries, or moving a chair?  Rating(8)    +Driver, -BT, -Dye Allergies.  

## 2019-03-03 ENCOUNTER — Other Ambulatory Visit: Payer: Self-pay | Admitting: Rheumatology

## 2019-03-03 NOTE — Telephone Encounter (Signed)
Last Visit: 01/14/19  Next Visit: 03/16/19 Labs: 02/14/19 LFTs are elevated but is stable.  Okay to refill per Dr. Estanislado Pandy

## 2019-03-03 NOTE — Progress Notes (Deleted)
Office Visit Note  Patient: Miguel Evans             Date of Birth: 30-Mar-1981           MRN: [id number]             PCP: Nonnie Done., MD Referring: Nonnie Done., MD Visit Date: 03/16/2019 Occupation: @GUAROCC @  Subjective:  No chief complaint on file.   History of Present Illness: Miguel Evans is a 38 y.o. male ***   Activities of Daily Living:  Patient reports morning stiffness for *** {minute/hour:19697}.   Patient {ACTIONS;DENIES/REPORTS:21021675::"Denies"} nocturnal pain.  Difficulty dressing/grooming: {ACTIONS;DENIES/REPORTS:21021675::"Denies"} Difficulty climbing stairs: {ACTIONS;DENIES/REPORTS:21021675::"Denies"} Difficulty getting out of chair: {ACTIONS;DENIES/REPORTS:21021675::"Denies"} Difficulty using hands for taps, buttons, cutlery, and/or writing: {ACTIONS;DENIES/REPORTS:21021675::"Denies"}  No Rheumatology ROS completed.   PMFS History:  Patient Active Problem List   Diagnosis Date Noted  . Cervical disc disorder with radiculopathy 02/22/2019  . Myofascial pain syndrome 02/22/2019  . Tear of right rotator cuff 09/23/2018  . Psoriasis 03/27/2017  . 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 Patellofemoral arthralgia of both knees 02/24/2017  . Hyperuricemia 03/26/2016  . Elevated blood pressure  03/26/2016  . Elevated LFTs 03/26/2016  . Anxiety 03/26/2016  . Psoriatic arthritis ([facility]) 03/25/2016  . High risk medication use 03/25/2016  . Cervicalgia 03/25/2016  . Hand pain 03/25/2016  . 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 Anxiety   . Gout   . Hypertension     Family History  Problem Relation Age of Onset  . Cancer Mother        breast   . Bipolar disorder Mother   . Hypertension Father   . Osteoarthritis Father   . Emphysema Maternal Grandfather   . CAD Paternal Grandfather   . Healthy Son   . Healthy Son   . Healthy Daughter   . Healthy Daughter    Past Surgical History:  Procedure Laterality Date   . ANTERIOR CRUCIATE LIGAMENT REPAIR Bilateral Left in 2002, Right 2013  . ANTERIOR CRUCIATE LIGAMENT REPAIR Bilateral 2014  . SHOULDER ARTHROSCOPY WITH SUBACROMIAL DECOMPRESSION AND OPEN ROTATOR C Right 09/23/2018   Procedure: SHOULDER ARTHROSCOPY WITH SUBACROMIAL DECOMPRESSION AND OPEN ROTATOR CUFF REPAIR,;  Surgeon: 11/23/2018, MD;  Location: [facility];  Service: Orthopedics;  Laterality: Right;  WITH BLOCK  . VASECTOMY     Social History   Social History Narrative  . Not on file    There is no immunization history on file for this patient.   Objective: Vital Signs: There were no vitals taken for this visit.   Physical Exam   Musculoskeletal Exam: ***  CDAI Exam: CDAI Score: - Patient Global: -; Provider Global: - Swollen: -; Tender: - Joint Exam   No joint exam has been documented for this visit   There is currently no information documented on the homunculus. Go to the Rheumatology activity and complete the homunculus joint exam.  Investigation: No additional findings.  Imaging: Xr C-arm No Report  Result Date: 03/02/2019 Please see Notes tab for imaging impression.   Recent Labs: Lab Results  Component Value Date   WBC 5.7 02/14/2019   HGB 15.2 02/14/2019   PLT 215 02/14/2019   NA 142 02/14/2019   K 4.9 02/14/2019   CL 106 02/14/2019   CO2 26 02/14/2019   GLUCOSE 94 02/14/2019   BUN 16 02/14/2019   CREATININE 1.01 02/14/2019   BILITOT 0.9 02/14/2019   ALKPHOS 74  10/24/2016   AST 26 02/14/2019   ALT 51 (H) 02/14/2019   PROT 7.1 02/14/2019   ALBUMIN 4.7 10/24/2016   CALCIUM 9.3 02/14/2019   GFRAA 110 02/14/2019   QFTBGOLDPLUS NEGATIVE 10/11/2018    Speciality Comments: No specialty comments available.  Procedures:  No procedures performed Allergies: Patient has no known allergies.   Assessment / Plan:     Visit Diagnoses: No diagnosis found.  Orders: No orders of the defined types were placed in this encounter.  No orders of the  defined types were placed in this encounter.   Face-to-face time spent with patient was *** minutes. Greater than 50% of time was spent in counseling and coordination of care.  Follow-Up Instructions: No follow-ups on file.   Earnestine Mealing, CMA  Note - This record has been created using Editor, commissioning.  Chart creation errors have been sought, but may not always  have been located. Such creation errors do not reflect on  the standard of medical care.

## 2019-03-09 ENCOUNTER — Encounter: Payer: Self-pay | Admitting: Physical Medicine and Rehabilitation

## 2019-03-14 ENCOUNTER — Telehealth: Payer: Self-pay | Admitting: Rheumatology

## 2019-03-14 NOTE — Telephone Encounter (Signed)
Patient requesting a new handicap placard to be filled out. His current one expires today. Please call patient to inform him if form can be faxed to his wife. Patient will have fax # when you call. Advised patient no one to look at request until Tuesday.

## 2019-03-15 NOTE — Telephone Encounter (Signed)
Ok to provide temporary handicap placard.

## 2019-03-15 NOTE — Telephone Encounter (Signed)
6 month temporary placard provided to patient on 09/07/2018. Okay to provide another temporary placard?   Patient has psoriatic arthritis, gout and OA of bilateral knees.

## 2019-03-15 NOTE — Telephone Encounter (Signed)
Advised patient we can provide a temporary handicap placard. Patient verbalized understanding and provided fax # [phone] attn: Madelyn Brunner.  Form has been faxed to number provided.

## 2019-03-16 ENCOUNTER — Ambulatory Visit: Payer: BC Managed Care – PPO | Admitting: Rheumatology

## 2019-03-25 ENCOUNTER — Other Ambulatory Visit: Payer: Self-pay

## 2019-03-28 ENCOUNTER — Ambulatory Visit: Payer: BC Managed Care – PPO | Admitting: Neurology

## 2019-03-28 ENCOUNTER — Other Ambulatory Visit: Payer: Self-pay

## 2019-03-28 ENCOUNTER — Encounter: Payer: Self-pay | Admitting: Neurology

## 2019-03-28 VITALS — BP 160/94 | HR 95 | Ht 75.0 in | Wt 336.0 lb

## 2019-03-28 DIAGNOSIS — G5622 Lesion of ulnar nerve, left upper limb: Secondary | ICD-10-CM

## 2019-03-28 DIAGNOSIS — M79602 Pain in left arm: Secondary | ICD-10-CM

## 2019-03-28 DIAGNOSIS — M79601 Pain in right arm: Secondary | ICD-10-CM | POA: Diagnosis not present

## 2019-03-28 NOTE — Patient Instructions (Signed)
NCS/EMG of the left > right arm  Avoid compression of the nerve at the elbow and minimize over flexing at the elbow.  You can use a soft elbow pad or towel wrap to help with this  ELECTROMYOGRAM AND NERVE CONDUCTION STUDIES (EMG/NCS) INSTRUCTIONS  How to Prepare The neurologist conducting the EMG will need to know if you have certain medical conditions. Tell the neurologist and other EMG lab personnel if you: . Have a pacemaker or any other electrical medical device . Take blood-thinning medications . Have hemophilia, a blood-clotting disorder that causes prolonged bleeding Bathing Take a shower or bath shortly before your exam in order to remove oils from your skin. Don't apply lotions or creams before the exam.  What to Expect You'll likely be asked to change into a hospital gown for the procedure and lie down on an examination table. The following explanations can help you understand what will happen during the exam.  . Electrodes. The neurologist or a technician places surface electrodes at various locations on your skin depending on where you're experiencing symptoms. Or the neurologist may insert needle electrodes at different sites depending on your symptoms.  . Sensations. The electrodes will at times transmit a tiny electrical current that you may feel as a twinge or spasm. The needle electrode may cause discomfort or pain that usually ends shortly after the needle is removed. If you are concerned about discomfort or pain, you may want to talk to the neurologist about taking a short break during the exam.  . Instructions. During the needle EMG, the neurologist will assess whether there is any spontaneous electrical activity when the muscle is at rest - activity that isn't present in healthy muscle tissue - and the degree of activity when you slightly contract the muscle.  He or she will give you instructions on resting and contracting a muscle at appropriate times. Depending on what  muscles and nerves the neurologist is examining, he or she may ask you to change positions during the exam.  After your EMG You may experience some temporary, minor bruising where the needle electrode was inserted into your muscle. This bruising should fade within several days. If it persists, contact your primary care doctor.

## 2019-03-28 NOTE — Progress Notes (Signed)
[facility] Neurology Division Clinic Note - Initial Visit   Date: 03/28/19  Miguel Evans MRN: [id number] DOB: [birth year]   Dear Dr. Alvester MorinNewton:  Thank you for your kind referral of Miguel Evans for consultation of neck pain and left hand paresthesias. Although his history is well known to you, please allow us to reiterate it for the purpose of our medical record. The patient was accompanied to the clinic by self.   History of Present Illness: Miguel Evans is a 38 y.o. right-handed male with hypertension, gout, and psoriatic arthritis  presenting for evaluation of left hand tingling.   Starting at the age of 38, he began having dull achy neck pain.  In his 3620s, he developed numbness of the upper arm circumfrentially. This progressed into tingling involving the left elbow and medial forearm radiating down into the 4th and 5th finger on left, which is constant.  Ice application to the neck alleviates throbbing pain.  Nothing alleviates his hand numbness/tingling.  He also feels that his left arm and hand is weaker.  He often wakes up with his hands falling asleep.   MRI cervical spine was performed in October which showed bilateral foraminal stenosis at C7 and possibly left C5, no evidence of impingement at C8.  He received ESI which did not help and feels that it made the pain worse.    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   Out-side paper records, electronic medical record, and images have been reviewed where available and summarized as:  MRI cervical spine wo contrast 01/28/2019: Degenerative changes throughout the cervical and upper thoracic region as outlined above. No compressive central canal stenosis.  Bilateral foraminal narrowing at C6-7 that would have some potential to affect either C7 nerve. Lesser foraminal narrowing on the left at C4-5 due to uncovertebral encroachment could possibly affect the left C5 nerve.  Lab Results  Component Value Date   ESRSEDRATE 2 02/14/2019    Past Medical History:  Diagnosis Date  . Anxiety   . Gout   . Hypertension     Past Surgical History:  Procedure Laterality Date  . ANTERIOR CRUCIATE LIGAMENT REPAIR Bilateral Left in 2002, Right 2013  . ANTERIOR CRUCIATE LIGAMENT REPAIR Bilateral 2014  . SHOULDER ARTHROSCOPY WITH SUBACROMIAL DECOMPRESSION AND OPEN ROTATOR C Right 09/23/2018   Procedure: SHOULDER ARTHROSCOPY WITH SUBACROMIAL DECOMPRESSION AND OPEN ROTATOR CUFF REPAIR,;  Surgeon: Valeria BatmanWhitfield, Peter W, MD;  Location: [facility];  Service: Orthopedics;  Laterality: Right;  WITH BLOCK  . VASECTOMY       Medications:  Outpatient Encounter Medications as of 03/28/2019  Medication Sig  . allopurinol (ZYLOPRIM) 300 MG tablet TAKE 1 TABLET DAILY (Patient taking differently: Take 300 mg by mouth daily. )  . ALPRAZolam (XANAX) 0.5 MG tablet Take 0.5 mg by mouth at bedtime as needed for anxiety.  Marland Kitchen. amLODipine (NORVASC) 5 MG tablet Take 5 mg by mouth daily.  . DULoxetine (CYMBALTA) 30 MG capsule Take 30 mg by mouth 2 (two) times daily.   . folic acid (FOLVITE) 1 MG tablet Take 2 mg by mouth daily.  Marland Kitchen. glucosamine-chondroitin 500-400 MG tablet Take 1 tablet by mouth daily.  . Ixekizumab (TALTZ) 80 MG/ML SOAJ Inject 80 mg into the skin every 28 (twenty-eight) days.  Marland Kitchen. lisinopril (PRINIVIL,ZESTRIL) 20 MG tablet Take 20 mg by mouth daily.   . methotrexate 50 MG/2ML injection Inject 0.8 mLs (20 mg total) into the skin once a week.  Marland Kitchen. MITIGARE 0.6 MG  CAPS Take 1 capsule by mouth once daily  . Multiple Vitamin (MULTIVITAMIN) capsule Take 1 capsule by mouth daily.  . Omega-3 Fatty Acids (FISH OIL) 1000 MG CAPS Take 1,000 mg by mouth daily.   . diazepam (VALIUM) 5 MG tablet Take 1 by mouth 1 hour  pre-procedure with very light food. May bring 2nd tablet to appointment.   No facility-administered encounter medications on file as of 03/28/2019.    Allergies: No Known Allergies  Family History: Family History    Problem Relation Age of Onset  . Cancer Mother        breast   . Bipolar disorder Mother   . Hypertension Father   . Osteoarthritis Father   . Emphysema Maternal Grandfather   . CAD Paternal Grandfather   . Healthy Son   . Healthy Son   . Healthy Daughter   . Healthy Daughter     Social History: Social History   Tobacco Use  . Smoking status: Former Smoker    Packs/day: 0.25    Years: 5.00    Pack years: 1.25    Types: Cigarettes    Quit date: 03/27/2012    Years since quitting: 7.0  . Smokeless tobacco: Former Engineer, water Use Topics  . Alcohol use: Yes    Alcohol/week: 2.0 standard drinks    Types: 2 Cans of beer per week  . Drug use: No   Social History   Social History Narrative   Right handed   4 children   One story home    Review of Systems:  CONSTITUTIONAL: No fevers, chills, night sweats, or weight loss.   EYES: No visual changes or eye pain ENT: No hearing changes.  No history of nose bleeds.   RESPIRATORY: No cough, wheezing and shortness of breath.   CARDIOVASCULAR: Negative for chest pain, and palpitations.   GI: Negative for abdominal discomfort, blood in stools or black stools.  No recent change in bowel habits.   GU:  No history of incontinence.   MUSCLOSKELETAL: +history of joint pain, no swelling.  +myalgias.   SKIN: Negative for lesions, rash, and itching.   HEMATOLOGY/ONCOLOGY: Negative for prolonged bleeding, bruising easily, and swollen nodes.  No history of cancer.   ENDOCRINE: Negative for cold or heat intolerance, polydipsia or goiter.   PSYCH:  No depression or anxiety symptoms.   NEURO: As Above.   Vital Signs:  BP (!) 160/94   Pulse 95   Ht 6\' 3"  (1.905 m)   Wt (!) 336 lb (152.4 kg)   SpO2 98%   BMI 42.00 kg/m    General Medical Exam:   General:  Well appearing, comfortable.   Eyes/ENT: see cranial nerve examination.   Neck:   No carotid bruits. Respiratory:  Clear to auscultation, good air entry bilaterally.    Cardiac:  Regular rate and rhythm, no murmur.   Extremities:  No deformities, edema, or skin discoloration.  Skin:  No rashes or lesions.  Neurological Exam: MENTAL STATUS including orientation to time, place, person, recent and remote memory, attention span and concentration, language, and fund of knowledge is normal.  Speech is not dysarthric.  CRANIAL NERVES: II:  No visual field defects.  III-IV-VI: Pupils equal round and reactive to light.  Normal conjugate, extra-ocular eye movements in all directions of gaze.  No nystagmus.  No ptosis.   VII:  Normal facial symmetry and movements.   VIII:  Normal hearing and vestibular function.    MOTOR:  No  atrophy, fasciculations or abnormal movements.  No pronator drift.   Upper Extremity:  Right  Left  Deltoid  5/5   5/5   Biceps  5/5   5/5   Triceps  5/5   5/5   Infraspinatus 5/5  5/5  Medial pectoralis 5/5  5/5  Wrist extensors  5/5   5/5   Wrist flexors  5/5   5/5   Finger extensors  5/5   5/5   Finger flexors  5/5   5/5   Dorsal interossei  5/5   5/5   Abductor pollicis  5/5   5/5   Tone (Ashworth scale)  0  0   Lower Extremity:  Right  Left  Hip flexors  5/5   5/5   Hip extensors  5/5   5/5   Adductor 5/5  5/5  Abductor 5/5  5/5  Knee flexors  5/5   5/5   Knee extensors  5/5   5/5   Dorsiflexors  5/5   5/5   Plantarflexors  5/5   5/5   Toe extensors  5/5   5/5   Toe flexors  5/5   5/5   Tone (Ashworth scale)  0  0   MSRs:  Right        Left                  brachioradialis 2+  2+  biceps 2+  2+  triceps 2+  2+  patellar 2+  2+  ankle jerk 2+  2+  Hoffman no  no  plantar response down  down   SENSORY:  Reduced pin prick and temperature over the ulnar distribution of the hand on the left, otherwise, sensation to all modalities in the arm and legs are intact.  Tinel's sign is positive at the left elbow.   COORDINATION/GAIT: Normal finger-to- nose-finger.  Intact rapid alternating movements bilaterally. Gait narrow  based and stable. Tandem and stressed gait intact.    IMPRESSION: 1. 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  No evidence of weakness on exam.   - NCS/EMG of the left arm to better characterize the nature of his symptoms  - Strategies to minimize nerve compression and stretching of the nerve at the elbow discussed  2. Cervical radiculopathy affecting bilateral C7 nerve roots and left C5.  - His left shoulder numbness may be stemming from left C5 nerve impingement.  Further recommendations pending results.  Thank you for allowing me to participate in patient's care.  If I can answer any additional questions, I would be pleased to do so.    Sincerely,    Sulamita Lafountain K. Posey Pronto, DO

## 2019-04-05 ENCOUNTER — Other Ambulatory Visit: Payer: Self-pay

## 2019-04-06 ENCOUNTER — Other Ambulatory Visit: Payer: Self-pay

## 2019-04-06 ENCOUNTER — Ambulatory Visit (INDEPENDENT_AMBULATORY_CARE_PROVIDER_SITE_OTHER): Payer: BC Managed Care – PPO | Admitting: Neurology

## 2019-04-06 DIAGNOSIS — M79601 Pain in right arm: Secondary | ICD-10-CM

## 2019-04-06 DIAGNOSIS — M79602 Pain in left arm: Secondary | ICD-10-CM | POA: Diagnosis not present

## 2019-04-06 DIAGNOSIS — G5622 Lesion of ulnar nerve, left upper limb: Secondary | ICD-10-CM

## 2019-04-06 NOTE — Progress Notes (Signed)
Follow-up Visit   Date: 04/06/19   Miguel Evans MRN: [id number] DOB: [birth year]   Interim History: Miguel Evans is a 38 y.o. right-handed Caucasian male here for EDX and follow-up of left arm tingling.  The patient was accompanied to the clinic by self.  History of present illness: Starting at the age of 38, he began having dull achy neck pain.  In his 19s, he developed numbness of the upper arm circumfrentially. This progressed into tingling involving the left elbow and medial forearm radiating down into the 4th and 5th finger on left, which is constant.  Ice application to the neck alleviates throbbing pain.  Nothing alleviates his hand numbness/tingling.  He also feels that his left arm and hand is weaker.  He often wakes up with his hands falling asleep.   MRI cervical spine was performed in October which showed bilateral foraminal stenosis at C7 and possibly left C5, no evidence of impingement at C8.  He received ESI which did not help and feels that it made the pain worse.    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   UPDATE 04/06/2019:  He is here for EDX of the arms.  He continues to have episodic numbness/tingling of the left hand, but is moreso bothered by nagging neck pain.  He has tried PT, seeing a Restaurant manager, fast food, and ESI with no relief.    Medications:  Current Outpatient Medications on File Prior to Visit  Medication Sig Dispense Refill  . allopurinol (ZYLOPRIM) 300 MG tablet TAKE 1 TABLET DAILY (Patient taking differently: Take 300 mg by mouth daily. ) 90 tablet 0  . ALPRAZolam (XANAX) 0.5 MG tablet Take 0.5 mg by mouth at bedtime as needed for anxiety.    Marland Kitchen amLODipine (NORVASC) 5 MG tablet Take 5 mg by mouth daily.    . DULoxetine (CYMBALTA) 30 MG capsule Take 30 mg by mouth 2 (two) times daily.     . folic acid (FOLVITE) 1 MG tablet Take 2 mg by mouth daily.    Marland Kitchen glucosamine-chondroitin 500-400 MG tablet Take 1 tablet by mouth daily.    . Ixekizumab  (TALTZ) 80 MG/ML SOAJ Inject 80 mg into the skin every 28 (twenty-eight) days. 3 mL 0  . lisinopril (PRINIVIL,ZESTRIL) 20 MG tablet Take 20 mg by mouth daily.     . methotrexate 50 MG/2ML injection Inject 0.8 mLs (20 mg total) into the skin once a week. 10 mL 0  . MITIGARE 0.6 MG CAPS Take 1 capsule by mouth once daily 60 capsule 0  . Multiple Vitamin (MULTIVITAMIN) capsule Take 1 capsule by mouth daily.    . Omega-3 Fatty Acids (FISH OIL) 1000 MG CAPS Take 1,000 mg by mouth daily.      No current facility-administered medications on file prior to visit.    Allergies: No Known Allergies   Vital Signs:  There were no vitals taken for this visit.   General Medical Exam:   General:  Well appearing, comfortable   Neurological Exam: Deferred   Data: MRI cervical spine wo contrast 01/28/2019: Degenerative changes throughout the cervical and upper thoracic region as outlined above. No compressive central canal stenosis.  Bilateral foraminal narrowing at C6-7 that would have some potential to affect either C7 nerve. Lesser foraminal narrowing on the left at C4-5 due to uncovertebral encroachment could possibly affect the left C5 nerve.  NCS/EMG of the left > right arm 04/06/2019: 1. Left median neuropathy at or distal to the  wrist (mild), consistent with a clinical diagnosis of carpal tunnel syndrome.   2. Incidentally, there is a right Martin-Gruber anastomoses, a normal anatomic variant. 3. There is no evidence of an ulnar neuropathy or cervical radiculopathy affecting the left upper extremity.  IMPRESSION/PLAN: Left hand paresthesias.  EDX shows no evidence of ulnar neuropathy or cervical radiculopathy.  There is mild left carpal tunnel syndrome, which may be causing his nocturnal paresthesias.  I have recommended that he use a wrist splint at night as needed for paresthesias.  Avoid hyperflexion at the wrist.   Regarding his ongoing neck pain, there is no clear findings to suggest  cervical radiculopathy in which case his symptoms may be moreso related to musculoskeletal pain.  He will follow-up with Dr. Alvester Morin for any other management options for cervicalgia.    Thank you for allowing me to participate in patient's care.  If I can answer any additional questions, I would be pleased to do so.    Sincerely,    Saul Fabiano K. Allena Katz, DO

## 2019-04-06 NOTE — Procedures (Signed)
[facility] Neurology  [address] [place], [address]  [place], [place] [address] Tel: [phone] Fax:  [phone] Test Date:  04/06/2019  Patient: Miguel Evans DOB: Jun 30, 1980 Physician: Nita Sickle, DO  Sex: Male Height: 6\' 3"  Ref Phys: , DO  ID#: Nita Sickle Temp: 35.0C Technician:    Patient Complaints: This is a 38 year old man referred for evaluation of left numbness and tingling and neck pain.  NCV & EMG Findings: Extensive electrodiagnostic testing of the left upper extremity and additional studies of the right shows:  1. Left median sensory response shows prolonged latency (3.5 ms).  Right median, right mixed palmar, and bilateral ulnar sensory responses are within normal limits. 2. Right median motor response shows reduced amplitude, however there is evidence of a Martin-Gruber anastomoses, a normal anatomic variant.  Right median and bilateral ulnar motor responses are within normal limits. 3. There is no evidence of active or chronic motor axonal loss changes affecting any of the tested muscles.  Motor unit configuration and recruitment pattern is within normal limits.  Impression: 1. Left median neuropathy at or distal to the wrist (mild), consistent with a clinical diagnosis of carpal tunnel syndrome.   2. Incidentally, there is a right Martin-Gruber anastomoses, a normal anatomic variant. 3. There is no evidence of an ulnar neuropathy or cervical radiculopathy affecting the left upper extremity    ___________________________ 30, DO    Nerve Conduction Studies Anti Sensory Summary Table   Site NR Peak (ms) Norm Peak (ms) P-T Amp (V) Norm P-T Amp  Left Median Anti Sensory (2nd Digit)  35C  Wrist    3.5 <3.4 27.1 >20  Right Median Anti Sensory (2nd Digit)  35C  Wrist    3.3 <3.4 23.7 >20  Left Ulnar Anti Sensory (5th Digit)  35C  Wrist    3.0 <3.1 16.1 >12  Right Ulnar Anti Sensory (5th Digit)  35C  Wrist    2.8 <3.1 17.2 >12   Motor  Summary Table   Site NR Onset (ms) Norm Onset (ms) O-P Amp (mV) Norm O-P Amp Site1 Site2 Delta-0 (ms) Dist (cm) Vel (m/s) Norm Vel (m/s)  Left Median Motor (Abd Poll Brev)  35C  Wrist    3.4 <3.9 9.5 >6 Elbow Wrist 6.2 34.0 55 >50  Elbow    9.6  8.7         Right Median Motor (Abd Poll Brev)  35C  Wrist    2.8 <3.9 5.1 >6 Elbow Wrist 5.7 30.0 53 >50  Elbow    8.5  2.7  Ulnar-wrist crossover Elbow 6.3 0.0    Ulnar-wrist crossover    2.2  3.4         Left Ulnar Motor (Abd Dig Minimi)  35C  Wrist    1.8 <3.1 8.7 >7 B Elbow Wrist 5.2 27.0 52 >50  B Elbow    7.0  8.3  A Elbow B Elbow 1.7 10.0 59 >50  A Elbow    8.7  8.1         Right Ulnar Motor (Abd Dig Minimi)  35C  Wrist    2.8 <3.1 10.3 >7 B Elbow Wrist 4.1 25.0 61 >50  B Elbow    6.9  9.4  A Elbow B Elbow 1.7 10.0 59 >50  A Elbow    8.6  9.2          Comparison Summary Table   Site NR Peak (ms) Norm Peak (ms) P-T Amp (V) Site1  Site2 Delta-P (ms) Norm Delta (ms)  Right Median/Ulnar Palm Comparison (Wrist - 8cm)  35C  Median Palm    1.8 <2.2 38.4 Median Palm Ulnar Palm 0.0   Ulnar Palm    1.8 <2.2 11.5       EMG   Side Muscle Ins Act Fibs Psw Fasc Number Recrt Dur Dur. Amp Amp. Poly Poly. Comment  Left 1stDorInt Nml Nml Nml Nml Nml Nml Nml Nml Nml Nml Nml Nml N/A  Left Abd Poll Brev Nml Nml Nml Nml Nml Nml Nml Nml Nml Nml Nml Nml N/A  Left PronatorTeres Nml Nml Nml Nml Nml Nml Nml Nml Nml Nml Nml Nml N/A  Left Biceps Nml Nml Nml Nml Nml Nml Nml Nml Nml Nml Nml Nml N/A  Left Triceps Nml Nml Nml Nml Nml Nml Nml Nml Nml Nml Nml Nml N/A  Left Deltoid Nml Nml Nml Nml Nml Nml Nml Nml Nml Nml Nml Nml N/A      Waveforms:

## 2019-05-04 ENCOUNTER — Other Ambulatory Visit: Payer: Self-pay | Admitting: Rheumatology

## 2019-05-04 DIAGNOSIS — L4050 Arthropathic psoriasis, unspecified: Secondary | ICD-10-CM

## 2019-05-04 NOTE — Telephone Encounter (Signed)
Last Visit: 01/14/19  Next Visit: 03/16/19 Labs: 02/14/19 LFTs are elevated but is stable. Tb Gold: 02/14/19 Neg   Okay to refill per Dr. Corliss Skains

## 2019-05-10 ENCOUNTER — Other Ambulatory Visit: Payer: Self-pay | Admitting: Rheumatology

## 2019-05-10 MED ORDER — MITIGARE 0.6 MG PO CAPS: 1.0000 | ORAL_CAPSULE | Freq: Every day | ORAL | 0 refills | Status: DC

## 2019-05-10 NOTE — Telephone Encounter (Signed)
Please schedule patient for a follow up visit. Patient was due December 2021. Thanks!  

## 2019-05-10 NOTE — Telephone Encounter (Signed)
Last Visit: 01/14/19  Next Visit: due December 2020. Message sent to the front to schedule patient  Labs: 02/14/19 LFTs are elevated but is stable.  Okay to refill per Dr. Corliss Skains

## 2019-05-13 ENCOUNTER — Other Ambulatory Visit: Payer: Self-pay | Admitting: Rheumatology

## 2019-05-13 NOTE — Telephone Encounter (Signed)
Last Visit: 01/14/19  Next Visit: 06/08/19 Labs: 11/2/20LFTs are elevated but is stable.  Patient will come to the office next week to update labs.  Okay to refill 30 day supply per Dr. Corliss Skains

## 2019-05-26 ENCOUNTER — Encounter: Payer: Self-pay | Admitting: Physical Medicine and Rehabilitation

## 2019-06-03 NOTE — Progress Notes (Signed)
Office Visit Note  Patient: Miguel Evans             Date of Birth: 1981/01/09           MRN: [id number]             PCP: Nonnie Done., MD Referring: Nonnie Done., MD Visit Date: 06/08/2019 Occupation: @GUAROCC @  Subjective:  Pain in both knee joints   History of Present Illness: Calvert Charland is a 39 y.o. male with history of psoriatic arthritis and osteoarthritis.  Patient is on Taltz 80 mg subcutaneous injections once every 28 days, methotrexate 0.8 mL once weekly, folic acid 2 mg by mouth daily.  He has noticed significant improvement with combination therapy.  His joint pain and inflammation in his hands have resolved.  His psoriasis is also cleared.  He continues to have chronic pain in both knee joints and his C-spine.  He states that the popliteal cyst of the left knee has returned.  He is also having discomfort in the left shoulder joint but has been able to continue to exercise on a regular basis.  He has been using a TENS unit as well as ice alternating with heat to relieve his neck pain.  He denies any recent gout flares.  He continues take allopurinol 300 mg 1 tablet by mouth daily and colchicine as needed.  Activities of Daily Living:  Patient reports morning stiffness for 1 hour.   Patient Reports nocturnal pain.  Difficulty dressing/grooming: Reports Difficulty climbing stairs: Reports Difficulty getting out of chair: Reports Difficulty using hands for taps, buttons, cutlery, and/or writing: Denies  Review of Systems  Constitutional: Positive for fatigue. Negative for night sweats.  HENT: Negative for mouth sores, mouth dryness and nose dryness.   Eyes: Negative for redness, itching and dryness.  Respiratory: Negative for shortness of breath and difficulty breathing.   Cardiovascular: Negative for chest pain, palpitations, hypertension, irregular heartbeat and swelling in legs/feet.  Gastrointestinal: Positive for blood in stool and diarrhea. Negative for  constipation.  Genitourinary: Negative for difficulty urinating and painful urination.  Musculoskeletal: Positive for arthralgias, joint pain, joint swelling and morning stiffness. Negative for myalgias, muscle weakness, muscle tenderness and myalgias.  Skin: Negative for color change, rash, hair loss, nodules/bumps, skin tightness, ulcers and sensitivity to sunlight.  Allergic/Immunologic: Negative for susceptible to infections.  Neurological: Positive for headaches. Negative for dizziness, fainting, memory loss, night sweats and weakness.  Hematological: Negative for bruising/bleeding tendency and swollen glands.  Psychiatric/Behavioral: Negative for depressed mood, confusion and sleep disturbance. The patient is not nervous/anxious.     PMFS History:  Patient Active Problem List   Diagnosis Date Noted  . Cervical disc disorder with radiculopathy 02/22/2019  . Myofascial pain syndrome 02/22/2019  . Tear of right rotator cuff 09/23/2018  . Psoriasis 03/27/2017  . 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 Patellofemoral arthralgia of both knees 02/24/2017  . Hyperuricemia 03/26/2016  . Elevated blood pressure  03/26/2016  . Elevated LFTs 03/26/2016  . Anxiety 03/26/2016  . Psoriatic arthritis (HCC) 03/25/2016  . High risk medication use 03/25/2016  . Cervicalgia 03/25/2016  . Hand pain 03/25/2016  . 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 Anxiety   . Gout   . Hypertension   . Sleep apnea     Family History  Problem Relation Age of Onset  . Cancer Mother        breast   . Bipolar disorder  Mother   . Hypertension Father   . Osteoarthritis Father   . Emphysema Maternal Grandfather   . CAD Paternal Grandfather   . Healthy Son   . Healthy Son   . Healthy Daughter   . Healthy Daughter    Past Surgical History:  Procedure Laterality Date  . ANTERIOR CRUCIATE LIGAMENT REPAIR Bilateral Left in 2002, Right 2013  . ANTERIOR CRUCIATE LIGAMENT  REPAIR Bilateral 2014  . SHOULDER ARTHROSCOPY WITH SUBACROMIAL DECOMPRESSION AND OPEN ROTATOR C Right 09/23/2018   Procedure: SHOULDER ARTHROSCOPY WITH SUBACROMIAL DECOMPRESSION AND OPEN ROTATOR CUFF REPAIR,;  Surgeon: Valeria Batman, MD;  Location: [facility];  Service: Orthopedics;  Laterality: Right;  WITH BLOCK  . VASECTOMY     Social History   Social History Narrative   Right handed   4 children   One story home    There is no immunization history on file for this patient.   Objective: Vital Signs: BP (!) 149/91 (BP Location: Left Wrist, Patient Position: Sitting, Cuff Size: Normal)   Pulse 92   Resp 17   Ht 6\' 3"  (1.905 m)   Wt (!) 329 lb 9.6 oz (149.5 kg)   BMI 41.20 kg/m    Physical Exam Vitals and nursing note reviewed.  Constitutional:      Appearance: He is well-developed.  HENT:     Head: Normocephalic and atraumatic.  Eyes:     Conjunctiva/sclera: Conjunctivae normal.     Pupils: Pupils are equal, round, and reactive to light.  Pulmonary:     Effort: Pulmonary effort is normal.  Abdominal:     General: Bowel sounds are normal.     Palpations: Abdomen is soft.  Musculoskeletal:     Cervical back: Normal range of motion and neck supple.  Skin:    General: Skin is warm and dry.     Capillary Refill: Capillary refill takes less than 2 seconds.  Neurological:     Mental Status: He is alert and oriented to person, place, and time.  Psychiatric:        Behavior: Behavior normal.      Musculoskeletal Exam: C-spine painful ROM with lateral rotation to the right side and with flexion.  Thoracic and lumbar spine good range of motion.  No midline spinal tenderness.  No SI joint tenderness.  Shoulder joints have full range of motion with some discomfort in the left shoulder.  Elbow joints, wrist joints, MCPs, PIPs, DIPs good range of motion with no synovitis.  He has tenderness of the left second MCP and PIP joint.  He has complete fist formation bilaterally.  Hip  joints have good range of motion with no discomfort.  Knee joints have good range of motion with no warmth or effusion.  Popliteal cyst in the posterior aspect of the left knee noted.  Ankle joints have good range of motion no tenderness or inflammation.  No Achilles tendinitis or plantar fasciitis.    CDAI Exam: CDAI Score: -- Patient Global: --; Provider Global: -- Swollen: --; Tender: -- Joint Exam 06/08/2019   No joint exam has been documented for this visit   There is currently no information documented on the homunculus. Go to the Rheumatology activity and complete the homunculus joint exam.  Investigation: No additional findings.  Imaging: No results found.  Recent Labs: Lab Results  Component Value Date   WBC 5.7 02/14/2019   HGB 15.2 02/14/2019   PLT 215 02/14/2019   NA 142 02/14/2019   K 4.9  02/14/2019   CL 106 02/14/2019   CO2 26 02/14/2019   GLUCOSE 94 02/14/2019   BUN 16 02/14/2019   CREATININE 1.01 02/14/2019   BILITOT 0.9 02/14/2019   ALKPHOS 74 10/24/2016   AST 26 02/14/2019   ALT 51 (H) 02/14/2019   PROT 7.1 02/14/2019   ALBUMIN 4.7 10/24/2016   CALCIUM 9.3 02/14/2019   GFRAA 110 02/14/2019   QFTBGOLDPLUS NEGATIVE 10/11/2018    Speciality Comments: No specialty comments available.  Procedures:  Large Joint Inj: L glenohumeral on 06/08/2019 4:08 PM Indications: pain Details: 27 G 1.5 in needle, posterior approach  Arthrogram: No  Medications: 1.5 mL lidocaine 1 %; 40 mg triamcinolone acetonide 40 MG/ML Aspirate: 0 mL Outcome: tolerated well, no immediate complications Procedure, treatment alternatives, risks and benefits explained, specific risks discussed. Consent was given by the patient. Immediately prior to procedure a time out was called to verify the correct patient, procedure, equipment, support staff and site/side marked as required. Patient was prepped and draped in the usual sterile fashion.     Allergies: Patient has no known  allergies.   Assessment / Plan:     Visit Diagnoses: Psoriatic arthritis ([place]) - He has no synovitis or dactylitis on exam.  He continues to have chronic pain in both knee joints.  No warmth or effusion of his knee joints were noted on exam today.  He has not had any Achilles tendinitis or plantar fasciitis.  No SI joint tenderness.  His psoriasis has cleared.  He is experiencing pain in the left shoulder joint currently.  He requested a left shoulder joint cortisone injection today. He is clinically doing well on combination therapy of Taltz 80 mg IV days injections every 28 days (started in October 2020), methotrexate 0.8 mL once weekly, and folic acid 2 mg by mouth daily.  He is unable to increase the dose of methotrexate due to chronically elevated ALT.  We will check a sed rate today to assess for underlying inflammation.  He will continue on Taltz and methotrexate as prescribed.  He was advised to notify us if he develops increased joint pain or joint swelling.  He will follow-up in the office in 3 months.  Plan: Sedimentation rate  Psoriasis: He has no active psoriasis at this time.  High risk medication use - Taltz 80 mg every 28 days started in October 2020.  Methotrexate 0.8 mL subcu weekly, folic acid 2 mg p.o. daily.  last TB gold negative on 10/11/2018.  CBC and CMP were drawn on 02/14/2019.  He is due to update lab work today.  Orders were released.  A future order for TB gold will be placed today.- Plan: COMPLETE METABOLIC PANEL WITH GFR, CBC with Differential/Platelet  Primary osteoarthritis of both knees: Chronic pain.  He has good range of motion of both knee joints with discomfort bilaterally.  No warmth or effusion noted.  A popliteal cyst was palpated in the posterior aspect of the left knee.  He has had 3 cyst aspirations in the past but the fluid typically returns within 3 weeks.  We discussed scheduling an appointment with Dr. Durward Fortes to discuss surgical resection of the popliteal  cyst.  Synovial cyst of left popliteal space - 01/11/2019: 23 mL of clear fluid was aspirated from the popliteal cyst.  He has had 3 aspirations.  The fluid typically returns within 3 weeks.  He was advised to schedule appointment Dr. Durward Fortes to discuss surgical intervention.  Idiopathic chronic gout of multiple sites without  tophus -He has not had any recent gout flares.  He is clinically doing well on allopurinol 300 mg by mouth daily and Mitigare 0.6 mg daily prn. Uric acid:02/14/2019 7.2.  We discussed that ideally his uric acid level should be less than 6.  We discussed the importance of avoiding a purine rich diet.  He will continue on allopurinol as prescribed.  He was advised to notify us if he develops signs or symptoms of a gout flare.  DDD (degenerative disc disease), cervical: Chronic pain.  He had an epidural steroid injection performed by Dr. Ezzard Standing on 03/02/2019.  According to the patient his discomfort worsened after having the epidural injection performed.  He was evaluated by Dr. Allena Katz who performed NCV with EMG which revealed carpal tunnel of the left wrist.  No cervical radiculopathy was noted.  He has been using a TENS unit and alternating ice and heat for symptomatic relief.  Cervical radiculopathy: He experiences intermittent symptoms of radiculopathy.  NCV with EMG did not reveal radiculopathy on 04/06/2019.  Chronic left SI joint pain: He has no SI joint tenderness at this time.  Fibromyalgia: He continues to have generalized muscle aches muscle tenderness due to fibromyalgia.  His chronic fatigue related to insomnia.  We discussed importance of regular exercise and good sleep hygiene.  S/P right rotator cuff repair - Performed by Dr. Cleophas Dunker in June 2020.  He has good range of motion on exam with no discomfort at this time.  He has been cleared by Dr. Cleophas Dunker.  Chronic left shoulder pain: He has been experiencing increased pain in the left shoulder joint.  He has good  ROM of the left shoulder on exam.  No effusion noted.  A left shoulder joint cortisone injection was performed today. He tolerated the procedure well.  Procedure note completed above.   Other medical conditions are listed as follows:  History of hypertension  History of sleep apnea - CPAP  Orders: Orders Placed This Encounter  Procedures  . Large Joint Inj  . Large Joint Inj  . COMPLETE METABOLIC PANEL WITH GFR  . CBC with Differential/Platelet  . Sedimentation rate   No orders of the defined types were placed in this encounter.   Face-to-face time spent with patient was 30 minutes. Greater than 50% of time was spent in counseling and coordination of care.  Follow-Up Instructions: Return in about 3 months (around 09/05/2019) for Psoriatic arthritis, Osteoarthritis.   Gearldine Bienenstock, PA-C   I examined and evaluated the patient with [name] PA.  Psoriatic arthritis appears to be quite well controlled.  Although he has been having discomfort in his left shoulder which he relates to doing his workout.  He has tried all the topical agents without relief.  Per his request after informed consent was obtained left shoulder joint was injected with cortisone as described above.  He tolerated the procedure well.  He is having recurrent left popliteal cyst.  We will refer him to orthopedics surgery for evaluation.  The plan of care was discussed as noted above.  Pollyann Savoy, MD  Note - This record has been created using Animal nutritionist.  Chart creation errors have been sought, but may not always  have been located. Such creation errors do not reflect on  the standard of medical care.

## 2019-06-08 ENCOUNTER — Other Ambulatory Visit: Payer: Self-pay

## 2019-06-08 ENCOUNTER — Ambulatory Visit: Payer: BC Managed Care – PPO | Admitting: Physician Assistant

## 2019-06-08 ENCOUNTER — Encounter: Payer: Self-pay | Admitting: Physician Assistant

## 2019-06-08 VITALS — BP 149/91 | HR 92 | Resp 17 | Ht 75.0 in | Wt 329.6 lb

## 2019-06-08 DIAGNOSIS — Z9889 Other specified postprocedural states: Secondary | ICD-10-CM

## 2019-06-08 DIAGNOSIS — L409 Psoriasis, unspecified: Secondary | ICD-10-CM

## 2019-06-08 DIAGNOSIS — M5412 Radiculopathy, cervical region: Secondary | ICD-10-CM

## 2019-06-08 DIAGNOSIS — M170 Bilateral primary osteoarthritis of knee: Secondary | ICD-10-CM | POA: Diagnosis not present

## 2019-06-08 DIAGNOSIS — M533 Sacrococcygeal disorders, not elsewhere classified: Secondary | ICD-10-CM

## 2019-06-08 DIAGNOSIS — L4050 Arthropathic psoriasis, unspecified: Secondary | ICD-10-CM

## 2019-06-08 DIAGNOSIS — G8929 Other chronic pain: Secondary | ICD-10-CM | POA: Diagnosis not present

## 2019-06-08 DIAGNOSIS — M7122 Synovial cyst of popliteal space [Baker], left knee: Secondary | ICD-10-CM

## 2019-06-08 DIAGNOSIS — Z8679 Personal history of other diseases of the circulatory system: Secondary | ICD-10-CM

## 2019-06-08 DIAGNOSIS — Z79899 Other long term (current) drug therapy: Secondary | ICD-10-CM | POA: Diagnosis not present

## 2019-06-08 DIAGNOSIS — M797 Fibromyalgia: Secondary | ICD-10-CM

## 2019-06-08 DIAGNOSIS — M1A09X0 Idiopathic chronic gout, multiple sites, without tophus (tophi): Secondary | ICD-10-CM

## 2019-06-08 DIAGNOSIS — M5030 Other cervical disc degeneration, unspecified cervical region: Secondary | ICD-10-CM

## 2019-06-08 DIAGNOSIS — Z8669 Personal history of other diseases of the nervous system and sense organs: Secondary | ICD-10-CM

## 2019-06-08 DIAGNOSIS — M25512 Pain in left shoulder: Secondary | ICD-10-CM

## 2019-06-08 LAB — CBC WITH DIFFERENTIAL/PLATELET
Absolute Monocytes: 600 cells/uL (ref 200–950)
Basophils Absolute: 40 cells/uL (ref 0–200)
Basophils Relative: 0.5 %
Eosinophils Absolute: 166 cells/uL (ref 15–500)
Eosinophils Relative: 2.1 %
HCT: 47.4 % (ref 38.5–50.0)
Hemoglobin: 16.5 g/dL (ref 13.2–17.1)
Lymphs Abs: 2757 cells/uL (ref 850–3900)
MCH: 33 pg (ref 27.0–33.0)
MCHC: 34.8 g/dL (ref 32.0–36.0)
MCV: 94.8 fL (ref 80.0–100.0)
MPV: 9.5 fL (ref 7.5–12.5)
Monocytes Relative: 7.6 %
Neutro Abs: 4337 cells/uL (ref 1500–7800)
Neutrophils Relative %: 54.9 %
Platelets: 249 10*3/uL (ref 140–400)
RBC: 5 10*6/uL (ref 4.20–5.80)
RDW: 13.2 % (ref 11.0–15.0)
Total Lymphocyte: 34.9 %
WBC: 7.9 10*3/uL (ref 3.8–10.8)

## 2019-06-08 LAB — COMPLETE METABOLIC PANEL WITH GFR
AG Ratio: 1.8 (calc) (ref 1.0–2.5)
ALT: 77 U/L — ABNORMAL HIGH (ref 9–46)
AST: 48 U/L — ABNORMAL HIGH (ref 10–40)
Albumin: 5.1 g/dL (ref 3.6–5.1)
Alkaline phosphatase (APISO): 86 U/L (ref 36–130)
BUN: 14 mg/dL (ref 7–25)
CO2: 25 mmol/L (ref 20–32)
Calcium: 10.6 mg/dL — ABNORMAL HIGH (ref 8.6–10.3)
Chloride: 100 mmol/L (ref 98–110)
Creat: 0.99 mg/dL (ref 0.60–1.35)
GFR, Est African American: 112 mL/min/{1.73_m2} (ref >=60)
GFR, Est Non African American: 97 mL/min/{1.73_m2} (ref >=60)
Globulin: 2.9 g/dL (calc) (ref 1.9–3.7)
Glucose, Bld: 94 mg/dL (ref 65–99)
Potassium: 4.6 mmol/L (ref 3.5–5.3)
Sodium: 139 mmol/L (ref 135–146)
Total Bilirubin: 0.9 mg/dL (ref 0.2–1.2)
Total Protein: 8 g/dL (ref 6.1–8.1)

## 2019-06-08 LAB — SEDIMENTATION RATE: Sed Rate: 2 mm/h (ref 0–15)

## 2019-06-08 MED ORDER — LIDOCAINE HCL 1 % IJ SOLN
1.5000 mL | INTRAMUSCULAR | Status: AC | PRN
  Administered 2019-06-08: 1.5 mL

## 2019-06-08 MED ORDER — TRIAMCINOLONE ACETONIDE 40 MG/ML IJ SUSP
40.0000 mg | INTRAMUSCULAR | Status: AC | PRN
  Administered 2019-06-08: 40 mg via INTRA_ARTICULAR

## 2019-06-09 NOTE — Progress Notes (Signed)
LFTs are elevated and continue to trend up.  Please advise patient to reduce MTX to 6 tablets po once weekly.  Please advise patient to avoid tylenol, NSAIDs, and alcohol.   Calcium is elevated-10.6.  please advise patient to avoid taking calcium and vitamin D if he taking supplements.  CBC WNL.  ESR WNL.

## 2019-06-14 ENCOUNTER — Other Ambulatory Visit: Payer: Self-pay | Admitting: Rheumatology

## 2019-06-14 DIAGNOSIS — L4050 Arthropathic psoriasis, unspecified: Secondary | ICD-10-CM

## 2019-06-14 NOTE — Telephone Encounter (Signed)
Last Visit: 06/08/19 Next Visit: 09/08/19 Labs: 09/05/19 LFTs are elevated and continue to trend up CBC WNL.  TB Gold: 10/11/18 Neg    Okay to refill per Dr. Corliss Skains

## 2019-06-18 ENCOUNTER — Other Ambulatory Visit: Payer: Self-pay | Admitting: Rheumatology

## 2019-06-18 DIAGNOSIS — L4050 Arthropathic psoriasis, unspecified: Secondary | ICD-10-CM

## 2019-07-07 NOTE — Procedures (Signed)
Cervical Epidural Steroid Injection - Interlaminar Approach with Fluoroscopic Guidance  Patient: Miguel Evans      Date of Birth: [birth year] MRN: [id number] PCP: Nonnie Done., MD      Visit Date: 03/02/2019   Universal Protocol:    Date/Time: 07/07/2110:24 PM  Consent Given By: the patient  Position: PRONE  Additional Comments: Vital signs were monitored before and after the procedure. Patient was prepped and draped in the usual sterile fashion. The correct patient, procedure, and site was verified.   Injection Procedure Details:  Procedure Site One Meds Administered:  Meds ordered this encounter  Medications  . methylPREDNISolone acetate (DEPO-MEDROL) injection 80 mg     Laterality: Left  Location/Site: C7-T1  Needle size: 20 G  Needle type: Touhy  Needle Placement: Paramedian epidural space  Findings:  -Comments: Excellent flow of contrast into the epidural space.  Procedure Details: Using a paramedian approach from the side mentioned above, the region overlying the inferior lamina was localized under fluoroscopic visualization and the soft tissues overlying this structure were infiltrated with 4 ml. of 1% Lidocaine without Epinephrine. A # 20 gauge, Tuohy needle was inserted into the epidural space using a paramedian approach.  The epidural space was localized using loss of resistance along with lateral and contralateral oblique bi-planar fluoroscopic views.  After negative aspirate for air, blood, and CSF, a 2 ml. volume of Isovue-250 was injected into the epidural space and the flow of contrast was observed. Radiographs were obtained for documentation purposes.   The injectate was administered into the level noted above.  Additional Comments:  The patient tolerated the procedure well Dressing: 2 x 2 sterile gauze and Band-Aid    Post-procedure details: Patient was observed during the procedure. Post-procedure instructions were reviewed.  Patient left the  clinic in stable condition.

## 2019-07-07 NOTE — Progress Notes (Signed)
Miguel Evans - 39 y.o. male MRN [id number]  Date of birth: January 25, 1981  Office Visit Note: Visit Date: 03/02/2019 PCP: Nonnie Done., MD Referred by: Nonnie Done., MD  Subjective: Chief Complaint  Patient presents with  . Neck - Pain  . Left Shoulder - Pain  . Left Arm - Pain, Numbness   HPI:  Miguel Evans is a 39 y.o. male who comes in today For planned left C7-T1 interlaminar epidural steroid injection.  The patient has failed conservative care including home exercise, medications, time and activity modification.  This injection will be diagnostic and hopefully therapeutic.  Please see requesting physician notes for further details and justification.   ROS Otherwise per HPI.  Assessment & Plan: Visit Diagnoses:  1. Cervical radiculopathy   2. Cervical disc disorder with radiculopathy     Plan: No additional findings.   Meds & Orders:  Meds ordered this encounter  Medications  . methylPREDNISolone acetate (DEPO-MEDROL) injection 80 mg    Orders Placed This Encounter  Procedures  . XR C-ARM NO REPORT  . Epidural Steroid injection    Follow-up: No follow-ups on file.   Procedures: No procedures performed  Cervical Epidural Steroid Injection - Interlaminar Approach with Fluoroscopic Guidance  Patient: Miguel Evans      Date of Birth: [birth year] MRN: [id number] PCP: Nonnie Done., MD      Visit Date: 03/02/2019   Universal Protocol:    Date/Time: 07/07/2110:24 PM  Consent Given By: the patient  Position: PRONE  Additional Comments: Vital signs were monitored before and after the procedure. Patient was prepped and draped in the usual sterile fashion. The correct patient, procedure, and site was verified.   Injection Procedure Details:  Procedure Site One Meds Administered:  Meds ordered this encounter  Medications  . methylPREDNISolone acetate (DEPO-MEDROL) injection 80 mg     Laterality: Left  Location/Site: C7-T1  Needle size: 20 G  Needle  type: Touhy  Needle Placement: Paramedian epidural space  Findings:  -Comments: Excellent flow of contrast into the epidural space.  Procedure Details: Using a paramedian approach from the side mentioned above, the region overlying the inferior lamina was localized under fluoroscopic visualization and the soft tissues overlying this structure were infiltrated with 4 ml. of 1% Lidocaine without Epinephrine. A # 20 gauge, Tuohy needle was inserted into the epidural space using a paramedian approach.  The epidural space was localized using loss of resistance along with lateral and contralateral oblique bi-planar fluoroscopic views.  After negative aspirate for air, blood, and CSF, a 2 ml. volume of Isovue-250 was injected into the epidural space and the flow of contrast was observed. Radiographs were obtained for documentation purposes.   The injectate was administered into the level noted above.  Additional Comments:  The patient tolerated the procedure well Dressing: 2 x 2 sterile gauze and Band-Aid    Post-procedure details: Patient was observed during the procedure. Post-procedure instructions were reviewed.  Patient left the clinic in stable condition.    Clinical History: MRI CERVICAL SPINE WITHOUT CONTRAST  TECHNIQUE: Multiplanar, multisequence MR imaging of the cervical spine was performed. No intravenous contrast was administered.  COMPARISON:  Radiography 01/11/2019  FINDINGS: Alignment: Straightening of the normal cervical lordosis.  Vertebrae: No fracture or primary bone lesion.  Cord: No cord compression or primary cord lesion.  Posterior Fossa, vertebral arteries, paraspinal tissues: Negative  Disc levels:  Foramen magnum, C1-2 and C2-3 are normal.  C3-4: Minimal disc bulge.  No canal or  foraminal stenosis.  C4-5: Mild left-sided uncovertebral degenerative change. No canal stenosis. Mild left foraminal narrowing. Some potential this  could affect the left C5 nerve.  C5-6: Normal interspace.  C6-7: Spondylosis with endplate osteophytes and bulging of the disc. Narrowing of the ventral subarachnoid space but no compression of the cord. AP diameter of the canal in the midline 8 mm. Bilateral foraminal encroachment that could affect either C7 nerve.  C7-T1: Normal interspace.  T1-2 and T2-3: Noncompressive disc bulges.  IMPRESSION: Degenerative changes throughout the cervical and upper thoracic region as outlined above. No compressive central canal stenosis. Bilateral foraminal narrowing at C6-7 that would have some potential to affect either C7 nerve. Lesser foraminal narrowing on the left at C4-5 due to uncovertebral encroachment could possibly affect the left C5 nerve.   Electronically Signed   By: Nelson Chimes M.D.   On: 01/28/2019 20:19     Objective:  VS:  HT:    WT:   BMI:     BP:(!) 136/104  HR:99bpm  TEMP: ( )  RESP:  Physical Exam  Ortho Exam Imaging: No results found.

## 2019-07-29 ENCOUNTER — Other Ambulatory Visit: Payer: Self-pay | Admitting: Rheumatology

## 2019-07-29 MED ORDER — METHOTREXATE SODIUM CHEMO INJECTION 50 MG/2ML: INTRAMUSCULAR | 0 refills | Status: DC

## 2019-07-29 NOTE — Telephone Encounter (Signed)
Last Visit: 06/08/2019 Next Visit: 09/08/2019 Labs: 06/08/2019 LFTs are elevated and continue to trend up. Please advise patient to reduce MTX to 0.37m  once weekly. Please advise patient to avoid tylenol, NSAIDs, and alcohol.  Calcium is elevated-10.6. please advise patient to avoid taking calcium and vitamin D if he taking supplements.  CBC WNL. ESR WNL.   Okay to refill methotrexate 0.66monce weekly?

## 2019-07-29 NOTE — Telephone Encounter (Signed)
Ok to refill MTX 0.6 ml sq once weekly.

## 2019-08-26 NOTE — Progress Notes (Signed)
Office Visit Note  Patient: Miguel Evans             Date of Birth: Sep 12, 1980           MRN: [id number]             PCP: Nonnie Done., MD Referring: Nonnie Done., MD Visit Date: 09/08/2019 Occupation: @GUAROCC @  Subjective:  Medication monitoring   History of Present Illness: Miguel Evans is a 39 y.o. male with history of psoriatic arthritis and gout.  He is on Taltz 80 mg sq injections every 28 days, MTX 0.6 ml sq once weekly, and folic acid 2 mg po daily.  He has noticed an improvement since starting on Taltz.  He denies any joint swelling at this time. He has tenderness in both hands.  He has ongoing pain and stiffness in both knee joints.   He has a persistent popliteal cyst in the posterior aspect of the left knee.  He exercises 3-4 times per week. He has had an inadequate response to Enbrel, Humira, and cosentyx in the past.  He is taking allopurinol 300 mg 1 tablet by mouth daily.  He denies any recent gout flares.  He continues to take cymbalta 60 mg po daily. He denies any muscle tenderness or myalgias.    Activities of Daily Living:  Patient reports morning stiffness for 1.5 hours.   Patient Reports nocturnal pain.  Difficulty dressing/grooming: Reports Difficulty climbing stairs: Reports Difficulty getting out of chair: Reports Difficulty using hands for taps, buttons, cutlery, and/or writing: Reports  Review of Systems  Constitutional: Positive for fatigue. Negative for night sweats.  HENT: Positive for mouth dryness. Negative for mouth sores and nose dryness.   Eyes: Positive for dryness. Negative for redness.  Respiratory: Negative for shortness of breath and difficulty breathing.   Cardiovascular: Negative for chest pain, palpitations, hypertension and irregular heartbeat.  Gastrointestinal: Negative for constipation and diarrhea.  Endocrine: Negative for increased urination.  Genitourinary: Negative for difficulty urinating and painful urination.    Musculoskeletal: Positive for arthralgias, joint pain and morning stiffness. Negative for joint swelling, myalgias, muscle weakness, muscle tenderness and myalgias.  Skin: Negative for color change, rash, hair loss, nodules/bumps, skin tightness, ulcers and sensitivity to sunlight.  Allergic/Immunologic: Negative for susceptible to infections.  Neurological: Negative for dizziness, fainting, memory loss and night sweats.  Hematological: Negative for swollen glands.  Psychiatric/Behavioral: Negative for depressed mood and sleep disturbance. The patient is not nervous/anxious.     PMFS History:  Patient Active Problem List   Diagnosis Date Noted  . Cervical disc disorder with radiculopathy 02/22/2019  . Myofascial pain syndrome 02/22/2019  . Tear of right rotator cuff 09/23/2018  . Psoriasis 03/27/2017  . 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 Patellofemoral arthralgia of both knees 02/24/2017  . Hyperuricemia 03/26/2016  . Elevated blood pressure  03/26/2016  . Elevated LFTs 03/26/2016  . Anxiety 03/26/2016  . Psoriatic arthritis (HCC) 03/25/2016  . High risk medication use 03/25/2016  . Cervicalgia 03/25/2016  . Hand pain 03/25/2016  . 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 Anxiety   . Arthritis   . Gout   . Hypertension   . Psoriasis   . Psoriatic arthritis (HCC)   . Sleep apnea     Family History  Problem Relation Age of Onset  . Cancer Mother        breast   . Bipolar disorder Mother   .  Hypertension Father   . Osteoarthritis Father   . Emphysema Maternal Grandfather   . CAD Paternal Grandfather   . Healthy Son   . Healthy Son   . Healthy Daughter   . Healthy Daughter    Past Surgical History:  Procedure Laterality Date  . ANTERIOR CRUCIATE LIGAMENT REPAIR Bilateral Left in 2002, Right 2013  . ANTERIOR CRUCIATE LIGAMENT REPAIR Bilateral 2014  . SHOULDER ARTHROSCOPY WITH SUBACROMIAL DECOMPRESSION AND OPEN ROTATOR C Right  09/23/2018   Procedure: SHOULDER ARTHROSCOPY WITH SUBACROMIAL DECOMPRESSION AND OPEN ROTATOR CUFF REPAIR,;  Surgeon: Garald Balding, MD;  Location: [facility];  Service: Orthopedics;  Laterality: Right;  WITH BLOCK  . VASECTOMY     Social History   Social History Narrative   Right handed   4 children   One story home    There is no immunization history on file for this patient.   Objective: Vital Signs: BP (!) 147/85 (BP Location: Left Arm, Patient Position: Sitting, Cuff Size: Normal)   Pulse (!) 106   Resp 14   Ht 6\' 3"  (1.905 m)   Wt (!) 329 lb 9.6 oz (149.5 kg)   BMI 41.20 kg/m    Physical Exam Vitals and nursing note reviewed.  Constitutional:      Appearance: He is well-developed.  HENT:     Head: Normocephalic and atraumatic.  Eyes:     Conjunctiva/sclera: Conjunctivae normal.     Pupils: Pupils are equal, round, and reactive to light.  Pulmonary:     Effort: Pulmonary effort is normal.  Abdominal:     General: Bowel sounds are normal.     Palpations: Abdomen is soft.  Musculoskeletal:     Cervical back: Normal range of motion and neck supple.  Skin:    General: Skin is warm and dry.     Capillary Refill: Capillary refill takes less than 2 seconds.  Neurological:     Mental Status: He is alert and oriented to person, place, and time.  Psychiatric:        Behavior: Behavior normal.      Musculoskeletal Exam: C-spine, thoracic spine, and lumbar spine good ROM.  Shoulder joints, elbow joints, knee joints, ankle joints, MTPs, PIPs, and DIPs good ROM with no synovitis.  Complete fist formation bilaterally.  Hip joints, knee joints, ankle joints, MTPs, PIPs, and DIPs good ROM with no synovitis.  No warmth or effusion of knee joints.  Baker's cyst left knee.  No tenderness or inflammation of ankle joints.  No achilles tendonitis or plantar fasciitis.   CDAI Exam: CDAI Score: -- Patient Global: --; Provider Global: -- Swollen: --; Tender: -- Joint Exam 09/08/2019    No joint exam has been documented for this visit   There is currently no information documented on the homunculus. Go to the Rheumatology activity and complete the homunculus joint exam.  Investigation: No additional findings.  Imaging: No results found.  Recent Labs: Lab Results  Component Value Date   WBC 7.9 06/08/2019   HGB 16.5 06/08/2019   PLT 249 06/08/2019   NA 139 06/08/2019   K 4.6 06/08/2019   CL 100 06/08/2019   CO2 25 06/08/2019   GLUCOSE 94 06/08/2019   BUN 14 06/08/2019   CREATININE 0.99 06/08/2019   BILITOT 0.9 06/08/2019   ALKPHOS 74 10/24/2016   AST 48 (H) 06/08/2019   ALT 77 (H) 06/08/2019   PROT 8.0 06/08/2019   ALBUMIN 4.7 10/24/2016   CALCIUM 10.6 (H) 06/08/2019  GFRAA 112 06/08/2019   QFTBGOLDPLUS NEGATIVE 10/11/2018    Speciality Comments: No specialty comments available.  Procedures:  No procedures performed Allergies: Patient has no known allergies.   Assessment / Plan:     Visit Diagnoses: Psoriatic arthritis (HCC): He has no synovitis or dactylitis on exam.  He has not had any recent psoriatic arthritis flares.  He is clinically doing well on Taltz 80 mg subcutaneous injections every 28 days, methotrexate 0.6 mL subcutaneous injections once weekly, and folic acid 2 mg by mouth daily.  He has noticed a significant improvement in his psoriatic arthritis since starting on Taltz in October 2020.  He has not noticed any joint inflammation but has noticed increased tenderness in both hands and both knees.  Both knee joints have good range of motion on examination today.  No warmth or effusion of his knees were noted.  He will continue on Taltz and methotrexate as prescribed.  We also discussed the importance of regular exercise and weight loss.  He was advised to notify us if he develops increased joint pain or joint swelling.  He will follow-up in the office in 5 months.  Psoriasis: He has no psoriasis at this time.  High risk medication use -  Taltz 80 mg sq injection every 28 days started in October 2020.  Methotrexate 0.6 mL sq weekly, folic acid 2 mg p.o. daily.  CBC and CMP were drawn on 06/08/2019.  LFTs remain elevated.  TB gold was negative on 10/11/2018.  A future order for TB gold was placed today.- Plan: CBC with Differential/Platelet, COMPLETE METABOLIC PANEL WITH GFR, QuantiFERON-TB Gold Plus  Primary osteoarthritis of both knees: He has good range of motion of both knee joints on exam.  No warmth or effusion was noted.  He continues to notice a Baker's cyst in the popliteal space of the left knee intermittently.  We discussed the importance of lower extremity muscle strengthening and weight loss.  He declined referral to the [facility] weight loss clinic.  Synovial cyst of left popliteal space - 01/11/2019: 23 mL of clear fluid was aspirated from the popliteal cyst.  He has had 3 aspirations in the past.  We discouraged him from having surgical excision due to requiring a gap in therapy prior to and after surgery.    Idiopathic chronic gout of multiple sites without tophus - He has not had any recent gout flares.  He is clinically doing well on allopurinol 300 mg by mouth daily and Mitigare 0.6 mg daily prn. uric acid: 02/14/2019 7.2  DDD (degenerative disc disease), cervical - He had an epidural steroid injection performed by Dr. Ezzard Standing on 03/02/2019.   Cervical radiculopathy - NCV with EMG did not reveal radiculopathy on 04/06/2019.  Chronic left SI joint pain: No SI joint tenderness on exam.   Fibromyalgia:  He has not had any myalgias or muscle tenderness recently.  He continues to take Cymbalta 30 mg twice daily.  We discussed the importance of regular exercise and good sleep hygiene.  He has been exercising 3 to 4 days/week.  S/P right rotator cuff repair - Performed by Dr. Cleophas Dunker in June 2020.  He has good range of motion with no discomfort at this time.  Chronic left shoulder pain: He has good range of motion with no  discomfort.  Other medical conditions are listed as follows:  History of sleep apnea - CPAP  History of hypertension  Orders: Orders Placed This Encounter  Procedures  . CBC with  Differential/Platelet  . COMPLETE METABOLIC PANEL WITH GFR  . QuantiFERON-TB Gold Plus   No orders of the defined types were placed in this encounter.    Follow-Up Instructions: Return in about 5 months (around 02/08/2020) for Psoriatic arthritis, Gout.   Gearldine Bienenstock, PA-C  Note - This record has been created using Dragon software.  Chart creation errors have been sought, but may not always  have been located. Such creation errors do not reflect on  the standard of medical care.

## 2019-09-08 ENCOUNTER — Encounter: Payer: Self-pay | Admitting: Physician Assistant

## 2019-09-08 ENCOUNTER — Other Ambulatory Visit: Payer: Self-pay

## 2019-09-08 ENCOUNTER — Telehealth: Payer: Self-pay

## 2019-09-08 ENCOUNTER — Ambulatory Visit: Payer: BC Managed Care – PPO | Admitting: Physician Assistant

## 2019-09-08 VITALS — BP 147/85 | HR 106 | Resp 14 | Ht 75.0 in | Wt 329.6 lb

## 2019-09-08 DIAGNOSIS — L4050 Arthropathic psoriasis, unspecified: Secondary | ICD-10-CM | POA: Diagnosis not present

## 2019-09-08 DIAGNOSIS — Z79899 Other long term (current) drug therapy: Secondary | ICD-10-CM

## 2019-09-08 DIAGNOSIS — G8929 Other chronic pain: Secondary | ICD-10-CM

## 2019-09-08 DIAGNOSIS — M5412 Radiculopathy, cervical region: Secondary | ICD-10-CM

## 2019-09-08 DIAGNOSIS — M25512 Pain in left shoulder: Secondary | ICD-10-CM

## 2019-09-08 DIAGNOSIS — M170 Bilateral primary osteoarthritis of knee: Secondary | ICD-10-CM | POA: Diagnosis not present

## 2019-09-08 DIAGNOSIS — M797 Fibromyalgia: Secondary | ICD-10-CM

## 2019-09-08 DIAGNOSIS — Z8679 Personal history of other diseases of the circulatory system: Secondary | ICD-10-CM

## 2019-09-08 DIAGNOSIS — Z9889 Other specified postprocedural states: Secondary | ICD-10-CM

## 2019-09-08 DIAGNOSIS — L409 Psoriasis, unspecified: Secondary | ICD-10-CM

## 2019-09-08 DIAGNOSIS — M533 Sacrococcygeal disorders, not elsewhere classified: Secondary | ICD-10-CM

## 2019-09-08 DIAGNOSIS — Z8669 Personal history of other diseases of the nervous system and sense organs: Secondary | ICD-10-CM

## 2019-09-08 DIAGNOSIS — M5030 Other cervical disc degeneration, unspecified cervical region: Secondary | ICD-10-CM

## 2019-09-08 DIAGNOSIS — M1A09X0 Idiopathic chronic gout, multiple sites, without tophus (tophi): Secondary | ICD-10-CM

## 2019-09-08 DIAGNOSIS — M7122 Synovial cyst of popliteal space [Baker], left knee: Secondary | ICD-10-CM

## 2019-09-08 LAB — CBC WITH DIFFERENTIAL/PLATELET
Absolute Monocytes: 540 cells/uL (ref 200–950)
Basophils Absolute: 39 cells/uL (ref 0–200)
Basophils Relative: 0.6 %
Eosinophils Absolute: 143 cells/uL (ref 15–500)
Eosinophils Relative: 2.2 %
HCT: 50.1 % — ABNORMAL HIGH (ref 38.5–50.0)
Hemoglobin: 16.7 g/dL (ref 13.2–17.1)
Lymphs Abs: 2295 cells/uL (ref 850–3900)
MCH: 32.4 pg (ref 27.0–33.0)
MCHC: 33.3 g/dL (ref 32.0–36.0)
MCV: 97.1 fL (ref 80.0–100.0)
MPV: 9.7 fL (ref 7.5–12.5)
Monocytes Relative: 8.3 %
Neutro Abs: 3484 cells/uL (ref 1500–7800)
Neutrophils Relative %: 53.6 %
Platelets: 232 10*3/uL (ref 140–400)
RBC: 5.16 10*6/uL (ref 4.20–5.80)
RDW: 13 % (ref 11.0–15.0)
Total Lymphocyte: 35.3 %
WBC: 6.5 10*3/uL (ref 3.8–10.8)

## 2019-09-08 LAB — COMPLETE METABOLIC PANEL WITH GFR
AG Ratio: 2.1 (calc) (ref 1.0–2.5)
ALT: 71 U/L — ABNORMAL HIGH (ref 9–46)
AST: 42 U/L — ABNORMAL HIGH (ref 10–40)
Albumin: 5 g/dL (ref 3.6–5.1)
Alkaline phosphatase (APISO): 78 U/L (ref 36–130)
BUN: 14 mg/dL (ref 7–25)
CO2: 28 mmol/L (ref 20–32)
Calcium: 10.1 mg/dL (ref 8.6–10.3)
Chloride: 103 mmol/L (ref 98–110)
Creat: 0.97 mg/dL (ref 0.60–1.35)
GFR, Est African American: 115 mL/min/{1.73_m2} (ref >=60)
GFR, Est Non African American: 99 mL/min/{1.73_m2} (ref >=60)
Globulin: 2.4 g/dL (calc) (ref 1.9–3.7)
Glucose, Bld: 141 mg/dL — ABNORMAL HIGH (ref 65–99)
Potassium: 4.7 mmol/L (ref 3.5–5.3)
Sodium: 139 mmol/L (ref 135–146)
Total Bilirubin: 1 mg/dL (ref 0.2–1.2)
Total Protein: 7.4 g/dL (ref 6.1–8.1)

## 2019-09-08 NOTE — Telephone Encounter (Signed)
Patient has questions regarding the taltz co-pay card. [facility] advised patient that he has a $4,000 bill for the medication. Please advise.

## 2019-09-09 NOTE — Telephone Encounter (Signed)
Called [facility], was transferred to their billing department who stated the balance was an error. Patient's actual balance is $44.31. Called patient, had to leave a message to call pharmacy to schedule Taltz shipment and of corrected balance. Advised to call office back with any issues.

## 2019-09-09 NOTE — Progress Notes (Signed)
CBC WNL.  Glucose is elevated-141. LFTs are elevated but stable. Rest of CMP WNL.

## 2019-12-12 ENCOUNTER — Other Ambulatory Visit: Payer: Self-pay | Admitting: *Deleted

## 2019-12-12 ENCOUNTER — Other Ambulatory Visit: Payer: Self-pay | Admitting: Rheumatology

## 2019-12-12 DIAGNOSIS — L4050 Arthropathic psoriasis, unspecified: Secondary | ICD-10-CM

## 2019-12-12 DIAGNOSIS — Z79899 Other long term (current) drug therapy: Secondary | ICD-10-CM

## 2019-12-12 NOTE — Telephone Encounter (Signed)
Last Visit: 09/08/2019 Next Visit: 02/09/2020 Labs: 09/08/2019 CBC WNL. Glucose is elevated-141. LFTs are elevated but stable. Rest of CMP WNL.  TB Gold: 10/11/2018 Neg   Current Dose per office note 09/08/2019: Taltz 80 mg sq injection every 28 days   DX:: Psoriatic arthritis   Patient advised he is due to update labs.  Okay to refill 30 day supply Taltz?

## 2020-01-03 DIAGNOSIS — Z79899 Other long term (current) drug therapy: Secondary | ICD-10-CM

## 2020-01-03 DIAGNOSIS — Z9225 Personal history of immunosupression therapy: Secondary | ICD-10-CM

## 2020-01-03 DIAGNOSIS — Z111 Encounter for screening for respiratory tuberculosis: Secondary | ICD-10-CM

## 2020-01-03 MED ORDER — METHOTREXATE SODIUM CHEMO INJECTION 50 MG/2ML
INTRAMUSCULAR | 0 refills | Status: DC
Start: 2020-01-03 — End: 2020-02-09

## 2020-01-03 NOTE — Telephone Encounter (Signed)
Last Visit: 09/08/2019 Next Visit: 02/09/2020 Labs: 09/08/2019 CBC WNL. Glucose is elevated-141. LFTs are elevated but stable. Rest of CMP WNL.   Okay to refill MTX?

## 2020-01-03 NOTE — Telephone Encounter (Signed)
It is very difficult to make a decision by looking at the photograph.  This could be a ruptured Baker's cyst or a blood clot.  Patient should see his PCP locally.  He will need ultrasound to evaluate.  It is okay to send labs at [facility] in [place].  We can refill methotrexate.

## 2020-01-03 NOTE — Addendum Note (Signed)
Addended by: Henriette Combs on: 01/03/2020 01:14 PM   Modules accepted: Orders

## 2020-01-12 ENCOUNTER — Other Ambulatory Visit: Payer: Self-pay | Admitting: Rheumatology

## 2020-01-12 DIAGNOSIS — L4050 Arthropathic psoriasis, unspecified: Secondary | ICD-10-CM

## 2020-01-12 NOTE — Telephone Encounter (Signed)
Patient advised he is due to update labs prior to refilling. Patient states he will have them done as soon as he is able.

## 2020-01-16 ENCOUNTER — Other Ambulatory Visit: Payer: Self-pay | Admitting: Rheumatology

## 2020-01-16 DIAGNOSIS — L4050 Arthropathic psoriasis, unspecified: Secondary | ICD-10-CM

## 2020-01-23 DIAGNOSIS — M1712 Unilateral primary osteoarthritis, left knee: Secondary | ICD-10-CM | POA: Diagnosis not present

## 2020-01-23 DIAGNOSIS — M25562 Pain in left knee: Secondary | ICD-10-CM | POA: Diagnosis not present

## 2020-01-23 DIAGNOSIS — M7122 Synovial cyst of popliteal space [Baker], left knee: Secondary | ICD-10-CM | POA: Diagnosis not present

## 2020-01-26 ENCOUNTER — Encounter: Payer: Self-pay | Admitting: *Deleted

## 2020-01-26 NOTE — Progress Notes (Addendum)
Office Visit Note  Patient: Miguel Bododam Norby             Date of Birth: 11/01/1980           MRN: [id number]             PCP: Nonnie DoneSlatosky, John J., MD Referring: Nonnie DoneSlatosky, John J., MD Visit Date: 02/09/2020 Occupation: @GUAROCC @  Subjective:  Pain in both knee joints   History of Present Illness: Miguel Evans is a 39 y.o. male with history of psoriatic arthritis, DDD, and gout.  Patient is on Taltz 80 mg subcutaneous injections every 28 days.  He has been out of injectable methotrexate for the past 1 month due to the pharmacy not having his refill available.  He has noticed some increased pain and stiffness in both hands since being off of methotrexate.  He states that he is due for his Taltz injection in 3 days and typically experiences increased pain and stiffness in multiple joints leading up to his monthly injection.  He has occasional discomfort in his left SI joint.  He has chronic pain in both knee joints especially his left knee.  He states that he has established care [place] who has referred him to have the baker's cyst behind the left knee drained.  He does not plan on having surgery at this time.  He will be follow up with Dr. Tally Joeisco at the end of November.    He denies any gout flares.  He is taking allopurinol 300 mg daily.  He denies any recent infections.    Activities of Daily Living:  Patient reports morning stiffness for 45 minutes.   Patient Reports nocturnal pain.  Difficulty dressing/grooming: Denies Difficulty climbing stairs: Reports Difficulty getting out of chair: Reports Difficulty using hands for taps, buttons, cutlery, and/or writing: Reports  Review of Systems  Constitutional: Positive for fatigue.  HENT: Negative for mouth sores, mouth dryness and nose dryness.   Eyes: Negative for pain, itching and dryness.  Respiratory: Negative for shortness of breath and difficulty breathing.   Cardiovascular: Negative for chest pain and palpitations.    Gastrointestinal: Negative for blood in stool, constipation and diarrhea.  Endocrine: Negative for increased urination.  Genitourinary: Negative for difficulty urinating and painful urination.  Musculoskeletal: Positive for arthralgias, joint pain, joint swelling and morning stiffness.  Skin: Positive for rash and redness. Negative for color change.  Allergic/Immunologic: Negative for susceptible to infections.  Neurological: Positive for headaches. Negative for dizziness, numbness, memory loss and weakness.  Hematological: Positive for bruising/bleeding tendency.  Psychiatric/Behavioral: Negative for confusion and sleep disturbance.    PMFS History:  Patient Active Problem List   Diagnosis Date Noted  . Cervical disc disorder with radiculopathy 02/22/2019  . Myofascial pain syndrome 02/22/2019  . Tear of right rotator cuff 09/23/2018  . Psoriasis 03/27/2017  . 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 Patellofemoral arthralgia of both knees 02/24/2017  . Hyperuricemia 03/26/2016  . Elevated blood pressure  03/26/2016  . Elevated LFTs 03/26/2016  . Anxiety 03/26/2016  . Psoriatic arthritis (HCC) 03/25/2016  . High risk medication use 03/25/2016  . Cervicalgia 03/25/2016  . Hand pain 03/25/2016  . 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 Anxiety   . Arthritis   . Gout   . Hypertension   . Psoriasis   . Psoriatic arthritis (HCC)   . Sleep apnea     Family History  Problem Relation Age of  Onset  . Cancer Mother        breast   . Bipolar disorder Mother   . Hypertension Father   . Osteoarthritis Father   . Emphysema Maternal Grandfather   . CAD Paternal Grandfather   . Healthy Son   . Healthy Son   . Healthy Daughter   . Healthy Daughter    Past Surgical History:  Procedure Laterality Date  . ANTERIOR CRUCIATE LIGAMENT REPAIR Bilateral Left in 2002, Right 2013  . ANTERIOR CRUCIATE LIGAMENT REPAIR Bilateral 2014  . SHOULDER ARTHROSCOPY  WITH SUBACROMIAL DECOMPRESSION AND OPEN ROTATOR C Right 09/23/2018   Procedure: SHOULDER ARTHROSCOPY WITH SUBACROMIAL DECOMPRESSION AND OPEN ROTATOR CUFF REPAIR,;  Surgeon: Valeria Batman, MD;  Location: [facility];  Service: Orthopedics;  Laterality: Right;  WITH BLOCK  . VASECTOMY     Social History   Social History Narrative   Right handed   4 children   One story home    There is no immunization history on file for this patient.   Objective: Vital Signs: BP (!) 143/89 (BP Location: Left Arm, Patient Position: Sitting, Cuff Size: Normal)   Pulse 74   Resp 16   Ht 6\' 3"  (1.905 m)   Wt (!) 319 lb 12.8 oz (145.1 kg)   BMI 39.97 kg/m    Physical Exam Vitals and nursing note reviewed.  Constitutional:      Appearance: He is well-developed.  HENT:     Head: Normocephalic and atraumatic.  Eyes:     Conjunctiva/sclera: Conjunctivae normal.     Pupils: Pupils are equal, round, and reactive to light.  Pulmonary:     Effort: Pulmonary effort is normal.  Abdominal:     Palpations: Abdomen is soft.  Musculoskeletal:     Cervical back: Normal range of motion and neck supple.  Skin:    General: Skin is warm and dry.     Capillary Refill: Capillary refill takes less than 2 seconds.     Comments: Erythematous papules clustering on anterior surface of both upper extremities.    Neurological:     Mental Status: He is alert and oriented to person, place, and time.  Psychiatric:        Behavior: Behavior normal.      Musculoskeletal Exam: C-spine, thoracic spine, and lumbar spine good ROM.  Left SI joint tenderness.  Shoulder joints, elbow joints, wrist joints, MCPs, PIPs, and DIPs good ROM with no synovitis.  Tenderness of the right 1st, 2nd, and 3rd MCPs and left 2nd and 3rd MCP and PIP joints.  Complete fist formation bilaterally.  Hip joints good ROM with some discomfort and stiffness bilaterally.  Knee joints good ROM with no warmth or effusion.  Baker's cyst palpable behind right  knee.  Ankle joints good ROM with no tenderness or inflammation.  Tenderness over both achilles tendons. No tenderness of MTP joints.  Tenderness of the right 2nd PIP joint.   CDAI Exam: CDAI Score: -- Patient Global: --; Provider Global: -- Swollen: --; Tender: -- Joint Exam 02/09/2020   No joint exam has been documented for this visit   There is currently no information documented on the homunculus. Go to the Rheumatology activity and complete the homunculus joint exam.  Investigation: No additional findings.  Imaging: No results found.  Recent Labs: Lab Results  Component Value Date   WBC 6.5 09/08/2019   HGB 16.7 09/08/2019   PLT 232 09/08/2019   NA 139 09/08/2019   K 4.7 09/08/2019  CL 103 09/08/2019   CO2 28 09/08/2019   GLUCOSE 141 (H) 09/08/2019   BUN 14 09/08/2019   CREATININE 0.97 09/08/2019   BILITOT 1.0 09/08/2019   ALKPHOS 74 10/24/2016   AST 42 (H) 09/08/2019   ALT 71 (H) 09/08/2019   PROT 7.4 09/08/2019   ALBUMIN 4.7 10/24/2016   CALCIUM 10.1 09/08/2019   GFRAA 115 09/08/2019   QFTBGOLDPLUS NEGATIVE 10/11/2018    Speciality Comments: No specialty comments available.  Procedures:  No procedures performed Allergies: Patient has no known allergies.   Assessment / Plan:     Visit Diagnoses: Psoriatic arthritis (HCC): He has no synovitis or dactylitis on exam.  He has tenderness of multiple joints as described above.  He continues to have chronic pain in both knee joints but has no warmth or effusion on examination today.  He has tenderness along the Achilles tendon of both feet but no plantar fasciitis at this time.  He has left SI joint tenderness which is typically exacerbate by limping due to pain in his left knee joint.  He has no active psoriasis at this time.  Overall he is clinically doing well on Taltz 80 mg subcu days injections every 28 days, methotrexate 0.6 mL subcutaneous injections once weekly, and folic acid 2 mg by mouth daily.  He has been  off of methotrexate for 1 month due to pharmacy not refilling his medication.  A new refill for methotrexate was sent to the pharmacy.  He is due for his Taltz injection in 3 days and a sample was provided today in the office.  Overall he has found Taltz to be the most effective method Biologics he has been on.  He will continue on his current treatment regimen.  He was advised to notify us if he develops increased joint pain or joint swelling.  He will follow-up in the office in 5 months.  Psoriasis: He has no active psoriasis at this time.   High risk medication use - Taltz 80 mg sq injection every 28 days started in October 2020.  Sample of taltz provided today.  He will continue on Methotrexate 0.6 mL sq weekly and folic acid 2 mg p.o. daily.  - Plan: COMPLETE METABOLIC PANEL WITH GFR, CBC with Differential/Platelet, QuantiFERON-TB Gold Plus He has not had any recent infections.  He is aware that he is to hold methotrexate and Nelson Chimes if he develops signs or symptoms of an infection and to resume once the infection has completely cleared.  He has not received the COVID-19 vaccination and does not plan to at this time. He continues to notice an injection site reactions for several days after each Taltz injection.  We discussed the use of Benadryl cream or topical hydrocortisone cream for symptomatic relief.  We also discussed taking Zyrtec the day before, day of, and day after each Taltz injection.  Primary osteoarthritis of both knees: Chronic pain.   Synovial cyst of left popliteal space: Palpable and tender on exam today. He is scheduled for drainage by IR and will be following up with Dr. Tally Joe in [place] at the end of November 2021.   Idiopathic chronic gout of multiple sites without tophus - He has not had any recent gout flares.  He takes allopurinol 300 mg by mouth daily and Mitigare 0.6 mg daily prn.  He has not needed to take mitigare since his last office visit.  His  uric acid on 02/14/2019  was 7.2.  We will recheck uric acid level  today.  He will continue to take allopurinol as prescribed.  - Plan: Uric acid  DDD (degenerative disc disease), cervical - He had an epidural steroid injection performed by Dr. Ezzard Standing on 03/02/2019.  He has chronic neck pain and stiffness.  No symptoms of radiculopathy. He uses a TENs unit on a regular basis.   Cervical radiculopathy - NCV with EMG did not reveal radiculopathy on 04/06/2019.  Chronic left SI joint pain: He has tenderness to palpation over the left SI joint.    Fibromyalgia - He has generalized hyperalgesia and positive tender poitns.  he continues to take Cymbalta 30 mg twice daily.  Discussed the importance of regular exercise and good sleep hygiene.    S/P right rotator cuff repair - Performed by Dr. Cleophas Dunker in June 2020.  Doing well.  He has good ROM with no discomfort.   Chronic left shoulder pain: He has good ROM with no discomfort.    Localized papular rash: Erythematous papular rash on anterior surface of both upper extremities. He noticed the rash about 3 weeks ago.  He has been applying a topical OTC agent for poison ivy but the rash has not been improving. He describes the rash as a burning sensation. This is the second recurrence of the rash (first time in March 2021-self resolving).  Evaluated by Dr. Corliss Skains and Dr. Dimple Casey as well.  Rash is most consistent with contact dermatitis.  He has been hunting on a regular basis and holds his dog the entire time while outdoors. He was advised to try topical hydrocortisone cream.  If the rash persists or worsens he was advised to notify us and we will refer him to dermatology for further evaluation and treatment.   Other medical conditions are listed as follows:   History of hypertension  History of sleep apnea - CPAP  Orders: Orders Placed This Encounter  Procedures  . COMPLETE METABOLIC PANEL WITH GFR  . CBC with Differential/Platelet  . QuantiFERON-TB Gold Plus  . Uric  acid   Meds ordered this encounter  Medications  . methotrexate 50 MG/2ML injection    Sig: Inject 0.10mL into the skin once weekly.    Dispense:  8 mL    Refill:  0     Follow-Up Instructions: Return in about 5 months (around 07/09/2020) for Psoriatic arthritis, Gout.   Gearldine Bienenstock, PA-C  Note - This record has been created using Dragon software.  Chart creation errors have been sought, but may not always  have been located. Such creation errors do not reflect on  the standard of medical care.

## 2020-01-27 DIAGNOSIS — M25562 Pain in left knee: Secondary | ICD-10-CM | POA: Diagnosis not present

## 2020-01-31 DIAGNOSIS — S83242A Other tear of medial meniscus, current injury, left knee, initial encounter: Secondary | ICD-10-CM | POA: Diagnosis not present

## 2020-01-31 DIAGNOSIS — M1712 Unilateral primary osteoarthritis, left knee: Secondary | ICD-10-CM | POA: Diagnosis not present

## 2020-01-31 DIAGNOSIS — M7122 Synovial cyst of popliteal space [Baker], left knee: Secondary | ICD-10-CM | POA: Diagnosis not present

## 2020-01-31 DIAGNOSIS — M958 Other specified acquired deformities of musculoskeletal system: Secondary | ICD-10-CM | POA: Diagnosis not present

## 2020-02-09 ENCOUNTER — Ambulatory Visit: Payer: BC Managed Care – PPO | Admitting: Physician Assistant

## 2020-02-09 ENCOUNTER — Telehealth: Payer: Self-pay | Admitting: Rheumatology

## 2020-02-09 ENCOUNTER — Encounter: Payer: Self-pay | Admitting: Physician Assistant

## 2020-02-09 ENCOUNTER — Other Ambulatory Visit: Payer: Self-pay

## 2020-02-09 VITALS — BP 143/89 | HR 74 | Resp 16 | Ht 75.0 in | Wt 319.8 lb

## 2020-02-09 DIAGNOSIS — M1A09X0 Idiopathic chronic gout, multiple sites, without tophus (tophi): Secondary | ICD-10-CM | POA: Diagnosis not present

## 2020-02-09 DIAGNOSIS — Z8679 Personal history of other diseases of the circulatory system: Secondary | ICD-10-CM

## 2020-02-09 DIAGNOSIS — M170 Bilateral primary osteoarthritis of knee: Secondary | ICD-10-CM | POA: Diagnosis not present

## 2020-02-09 DIAGNOSIS — Z111 Encounter for screening for respiratory tuberculosis: Secondary | ICD-10-CM | POA: Diagnosis not present

## 2020-02-09 DIAGNOSIS — Z8669 Personal history of other diseases of the nervous system and sense organs: Secondary | ICD-10-CM

## 2020-02-09 DIAGNOSIS — M5030 Other cervical disc degeneration, unspecified cervical region: Secondary | ICD-10-CM

## 2020-02-09 DIAGNOSIS — M7122 Synovial cyst of popliteal space [Baker], left knee: Secondary | ICD-10-CM

## 2020-02-09 DIAGNOSIS — L4050 Arthropathic psoriasis, unspecified: Secondary | ICD-10-CM

## 2020-02-09 DIAGNOSIS — Z79899 Other long term (current) drug therapy: Secondary | ICD-10-CM | POA: Diagnosis not present

## 2020-02-09 DIAGNOSIS — M25512 Pain in left shoulder: Secondary | ICD-10-CM

## 2020-02-09 DIAGNOSIS — M533 Sacrococcygeal disorders, not elsewhere classified: Secondary | ICD-10-CM

## 2020-02-09 DIAGNOSIS — M5412 Radiculopathy, cervical region: Secondary | ICD-10-CM

## 2020-02-09 DIAGNOSIS — L409 Psoriasis, unspecified: Secondary | ICD-10-CM

## 2020-02-09 DIAGNOSIS — R21 Rash and other nonspecific skin eruption: Secondary | ICD-10-CM

## 2020-02-09 DIAGNOSIS — Z9889 Other specified postprocedural states: Secondary | ICD-10-CM

## 2020-02-09 DIAGNOSIS — M797 Fibromyalgia: Secondary | ICD-10-CM

## 2020-02-09 DIAGNOSIS — G8929 Other chronic pain: Secondary | ICD-10-CM

## 2020-02-09 MED ORDER — METHOTREXATE SODIUM CHEMO INJECTION 50 MG/2ML: INTRAMUSCULAR | 0 refills | Status: DC

## 2020-02-09 NOTE — Patient Instructions (Signed)
COVID-19 vaccine recommendations:   COVID-19 vaccine is recommended for everyone (unless you are allergic to a vaccine component), even if you are on a medication that suppresses your immune system.   If you are on Methotrexate, Cellcept (mycophenolate), Rinvoq, Harriette Ohara, and Olumiant- hold the medication for 1 week after each vaccine. Hold Methotrexate for 2 weeks after the single dose COVID-19 vaccine.   If you are on Orencia subcutaneous injection - hold medication one week prior to and one week after the first COVID-19 vaccine dose (only).   If you are on Orencia IV infusions- time vaccination administration so that the first COVID-19 vaccination will occur four weeks after the infusion and postpone the subsequent infusion by one week.   If you are on Cyclophosphamide or Rituxan infusions please contact your doctor prior to receiving the COVID-19 vaccine.   Do not take Tylenol or any anti-inflammatory medications (NSAIDs) 24 hours prior to the COVID-19 vaccination.   There is no direct evidence about the efficacy of the COVID-19 vaccine in individuals who are on medications that suppress the immune system.   Even if you are fully vaccinated, and you are on any medications that suppress your immune system, please continue to wear a mask, maintain at least six feet social distance and practice hand hygiene.   If you develop a COVID-19 infection, please contact your PCP or our office to determine if you need monoclonal antibody infusion.  The booster vaccine is now available for immunocompromised patients.   Please see the following web sites for updated information.   [email or web address]    Standing Labs We placed an order today for your standing lab work.   Please have your standing labs drawn in January   If possible, please have your labs drawn 2 weeks prior to your appointment so that the provider can discuss  your results at your appointment.  We have open lab daily Monday through Thursday from 8:30-12:30 PM and 1:30-4:30 PM and Friday from 8:30-12:30 PM and 1:30-4:00 PM at the office of Dr. Pollyann Savoy, [facility].   Please be advised, patients with office appointments requiring lab work will take precedents over walk-in lab work.  If possible, please come for your lab work on Monday and Friday afternoons, as you may experience shorter wait times. The office is located at [address], [place], [place] [address] No appointment is necessary.   Labs are drawn by [facility]. Please bring your co-pay at the time of your lab draw.  You may receive a bill from [facility] for your lab work.  If you wish to have your labs drawn at another location, please call the office 24 hours in advance to send orders.  If you have any questions regarding directions or hours of operation,  please call [phone].   As a reminder, please drink plenty of water prior to coming for your lab work. Thanks!

## 2020-02-09 NOTE — Progress Notes (Signed)
Medication Samples have been provided to the patient.  Drug name: Altamease Oiler     Strength: 80mg       Qty: 1 LOT AA  Exp.Date: 12/2020  Dosing instructions: Inject 80mg  into the skin every 28 days.   01/2021 11:27 AM 02/09/2020

## 2020-02-09 NOTE — Telephone Encounter (Signed)
Patient's handicap placard runs out end on November. Patient request a 6 month renewal. Please call when ready to pick up.

## 2020-02-09 NOTE — Telephone Encounter (Signed)
Ok to update renewal.  He has severe pain in both knee joints due to underlying osteoarthritis and previous injuries.

## 2020-02-09 NOTE — Telephone Encounter (Signed)
Patient came to office today and stated that he has a balance of $1,800 with [facility]. Called [facility], they state that patient has maxed out his copay card benefits for the year.  [facility]# [phone]  Will call Taltz program to verify and see if there are any other options.  Taltz#  [phone]

## 2020-02-09 NOTE — Telephone Encounter (Signed)
Patient advised handicap placard is ready for pick up.  

## 2020-02-10 NOTE — Progress Notes (Signed)
LFTs remain elevated but are stable. Rest of CMP WNL.  CBC WNL. Uric acid is 6.8.  continue taking allopurinol as prescribed.

## 2020-02-10 NOTE — Telephone Encounter (Signed)
Called Taltz, they are not allowed to discuss patient's financial status with me. Provided patient with Altamease Oiler phone number to discuss and he will call office back to advise if they are not able to assist.  Phone# [phone]

## 2020-02-11 DIAGNOSIS — Z20822 Contact with and (suspected) exposure to covid-19: Secondary | ICD-10-CM | POA: Diagnosis not present

## 2020-02-11 LAB — QUANTIFERON-TB GOLD PLUS
Mitogen-NIL: >10 IU/mL
NIL: 0.03 IU/mL
QuantiFERON-TB Gold Plus: NEGATIVE
TB1-NIL: 0 IU/mL
TB2-NIL: <0 IU/mL

## 2020-02-11 LAB — COMPLETE METABOLIC PANEL WITH GFR
AG Ratio: 1.8 (calc) (ref 1.0–2.5)
ALT: 67 U/L — ABNORMAL HIGH (ref 9–46)
AST: 45 U/L — ABNORMAL HIGH (ref 10–40)
Albumin: 4.9 g/dL (ref 3.6–5.1)
Alkaline phosphatase (APISO): 81 U/L (ref 36–130)
BUN: 16 mg/dL (ref 7–25)
CO2: 32 mmol/L (ref 20–32)
Calcium: 10.6 mg/dL — ABNORMAL HIGH (ref 8.6–10.3)
Chloride: 102 mmol/L (ref 98–110)
Creat: 1.03 mg/dL (ref 0.60–1.35)
GFR, Est African American: 106 mL/min/{1.73_m2} (ref >=60)
GFR, Est Non African American: 92 mL/min/{1.73_m2} (ref >=60)
Globulin: 2.7 g/dL (calc) (ref 1.9–3.7)
Glucose, Bld: 88 mg/dL (ref 65–99)
Potassium: 4.7 mmol/L (ref 3.5–5.3)
Sodium: 138 mmol/L (ref 135–146)
Total Bilirubin: 0.8 mg/dL (ref 0.2–1.2)
Total Protein: 7.6 g/dL (ref 6.1–8.1)

## 2020-02-11 LAB — CBC WITH DIFFERENTIAL/PLATELET
Absolute Monocytes: 624 cells/uL (ref 200–950)
Basophils Absolute: 41 cells/uL (ref 0–200)
Basophils Relative: 0.5 %
Eosinophils Absolute: 203 cells/uL (ref 15–500)
Eosinophils Relative: 2.5 %
HCT: 49.3 % (ref 38.5–50.0)
Hemoglobin: 16.7 g/dL (ref 13.2–17.1)
Lymphs Abs: 2349 cells/uL (ref 850–3900)
MCH: 32.4 pg (ref 27.0–33.0)
MCHC: 33.9 g/dL (ref 32.0–36.0)
MCV: 95.7 fL (ref 80.0–100.0)
MPV: 9.6 fL (ref 7.5–12.5)
Monocytes Relative: 7.7 %
Neutro Abs: 4884 cells/uL (ref 1500–7800)
Neutrophils Relative %: 60.3 %
Platelets: 266 10*3/uL (ref 140–400)
RBC: 5.15 10*6/uL (ref 4.20–5.80)
RDW: 12.5 % (ref 11.0–15.0)
Total Lymphocyte: 29 %
WBC: 8.1 10*3/uL (ref 3.8–10.8)

## 2020-02-11 LAB — URIC ACID: Uric Acid, Serum: 6.8 mg/dL (ref 4.0–8.0)

## 2020-02-13 NOTE — Progress Notes (Signed)
TB gold negative

## 2020-02-14 DIAGNOSIS — M7122 Synovial cyst of popliteal space [Baker], left knee: Secondary | ICD-10-CM | POA: Diagnosis not present

## 2020-02-24 NOTE — Telephone Encounter (Signed)
Called patient to follow up. He has not yet called Taltz Together program. Advised to call and let office know if they enrolled him into additional assistance.

## 2020-03-15 ENCOUNTER — Encounter: Payer: Self-pay | Admitting: Rheumatology

## 2020-03-16 NOTE — Telephone Encounter (Signed)
Medication Samples have been provided to the patient.  Drug name: Altamease Oiler      Strength: 80 mg        Qty: 1 LOT: [id number]  Exp.Date: 09/2021  Dosing instructions: inject 80 mg into skin every 28 days.   The patient has been instructed regarding the correct time, dose, and frequency of taking this medication, including desired effects and most common side effects.   Dulcy Fanny 3:52 PM 03/16/2020

## 2020-04-09 DIAGNOSIS — G4733 Obstructive sleep apnea (adult) (pediatric): Secondary | ICD-10-CM | POA: Diagnosis not present

## 2020-04-19 DIAGNOSIS — Z20822 Contact with and (suspected) exposure to covid-19: Secondary | ICD-10-CM | POA: Diagnosis not present

## 2020-04-20 ENCOUNTER — Encounter: Payer: Self-pay | Admitting: Rheumatology

## 2020-04-20 DIAGNOSIS — L4050 Arthropathic psoriasis, unspecified: Secondary | ICD-10-CM

## 2020-04-20 MED ORDER — TALTZ 80 MG/ML ~~LOC~~ SOAJ: SUBCUTANEOUS | 0 refills | Status: DC

## 2020-04-20 NOTE — Telephone Encounter (Signed)
Last Visit: 02/09/2020 Next Visit: 07/11/2020 Labs: 02/09/2020 LFTs remain elevated but are stable. Rest of CMP WNL. CBC WNL. TB Gold: 02/09/2020 Neg   Current Dose per office note 02/09/2020: Taltz 80 mg sq injection every 28 days   DX: Psoriatic arthritis   Okay to refill Taltz?

## 2020-04-24 ENCOUNTER — Other Ambulatory Visit: Payer: Self-pay | Admitting: Occupational Medicine

## 2020-04-24 ENCOUNTER — Other Ambulatory Visit: Payer: Self-pay

## 2020-04-24 ENCOUNTER — Ambulatory Visit: Payer: Self-pay

## 2020-04-24 DIAGNOSIS — M25512 Pain in left shoulder: Secondary | ICD-10-CM

## 2020-04-24 MED ORDER — HYDROCODONE-ACETAMINOPHEN 10-325 MG PO TABS: 1.0000 | ORAL_TABLET | Freq: Every evening | ORAL | 0 refills | Status: AC | PRN

## 2020-05-01 ENCOUNTER — Telehealth: Payer: Self-pay | Admitting: Pharmacy Technician

## 2020-05-01 NOTE — Telephone Encounter (Signed)
Received fax from [facility] that patient's insurance has changed.  Submitted a Prior Authorization request to [facility] for TALTZ via Cover My Meds. Will update once we receive a response.    (: [id number]

## 2020-05-02 NOTE — Telephone Encounter (Signed)
Received notification from [facility] regarding a prior authorization for TALTZ. Authorization has been APPROVED from 05/01/20 to 05/01/21.   Authorization # [id number]) Phone # [phone]  Per plan's website, patient should still be able to fill with [facility].  Called [facility] and advised.

## 2020-05-10 IMAGING — MR MR CERVICAL SPINE W/O CM
5 series · 38 of 48 positions shown · non-contrast
Comparison: Radiography 01/11/2019

CLINICAL DATA: Neck pain radiating to the left arm

EXAM:
MRI CERVICAL SPINE WITHOUT CONTRAST
TECHNIQUE: Multiplanar, multisequence MR imaging of the cervical spine was
performed. No intravenous contrast was administered.

[Series 5: T2 · sagittal · 3.0mm · 0.75mm/px · 6 of 15 slices shown (1 of 2)]
[im 1/15]
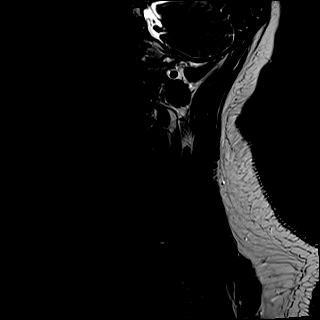
[im 3/15]
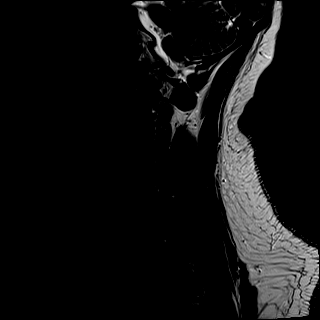
[im 6/15]
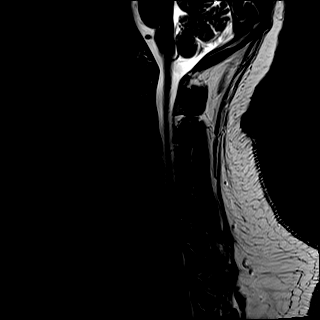
[im 9/15]
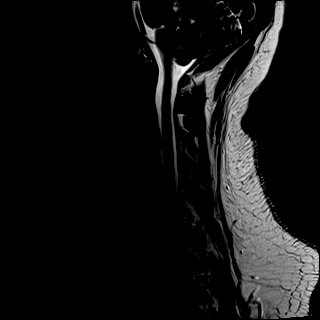
[im 12/15]
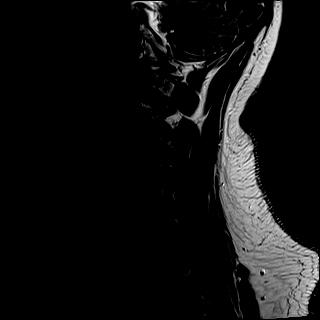
[im 15/15]
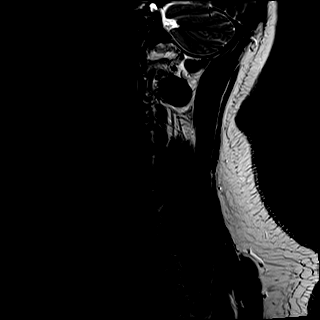

[Series 6: T1 · sagittal · 3.0mm · 0.69mm/px · 6 of 15 slices shown]
[im 1/15]
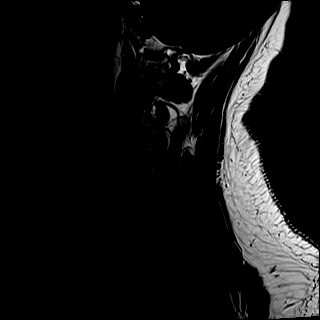
[im 3/15]
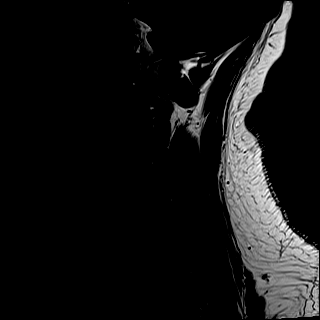
[im 6/15]
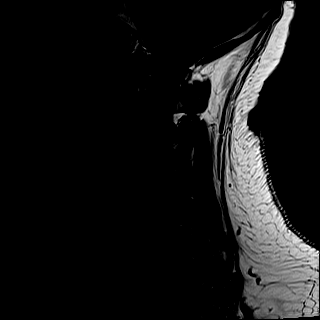
[im 9/15]
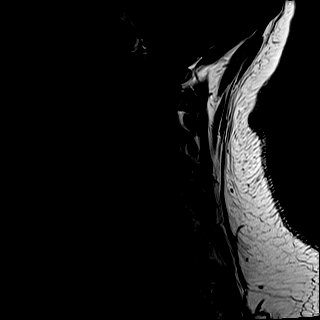
[im 12/15]
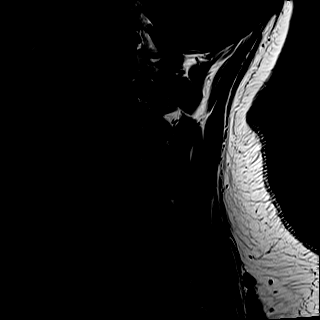
[im 15/15]
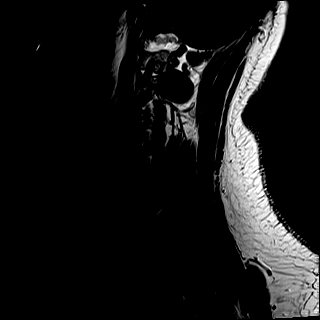

[Series 7: STIR · sagittal · 3.0mm · 0.86mm/px · 6 of 15 slices shown]
[im 1/15]
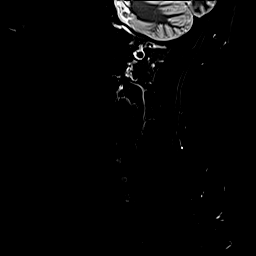
[im 3/15]
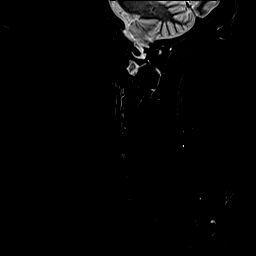
[im 6/15]
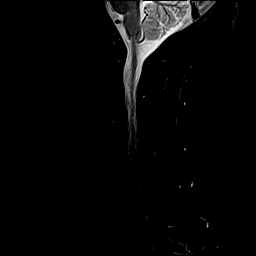
[im 9/15]
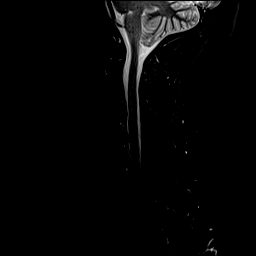
[im 12/15]
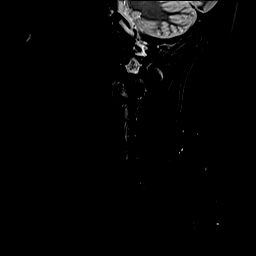
[im 15/15]
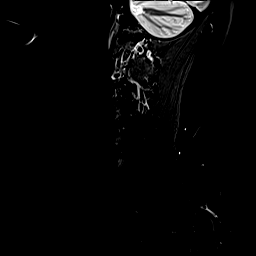

[Series 8: T2 · axial · 3.0mm · 0.66mm/px · z∈[-119,+8]mm · 12 of 40 slices shown (2 of 2)]
[im 1/40]
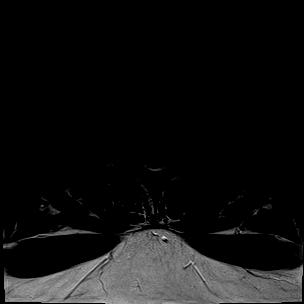
[im 3/40]
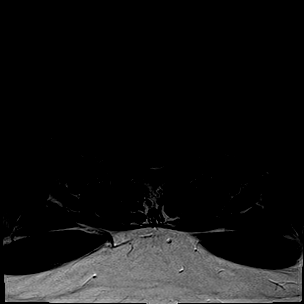
[im 6/40]
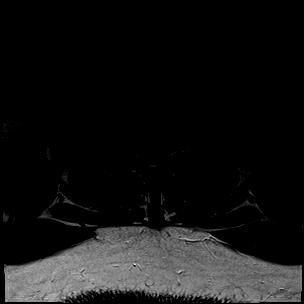
[im 9/40]
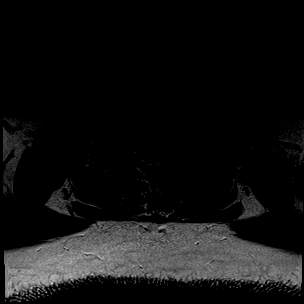
[im 12/40]
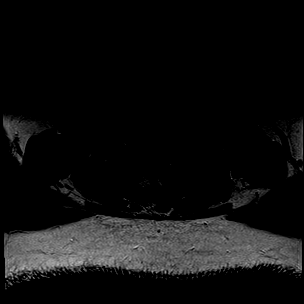
[im 14/40]
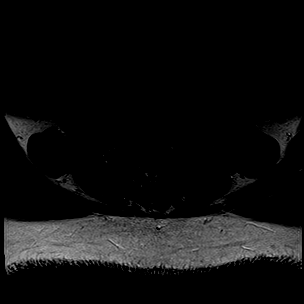
[im 17/40]
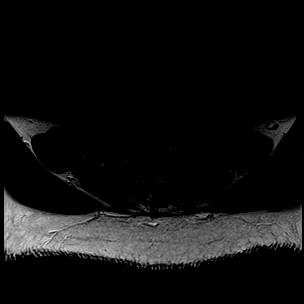
[im 20/40]
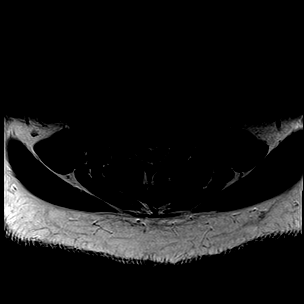
[im 23/40]
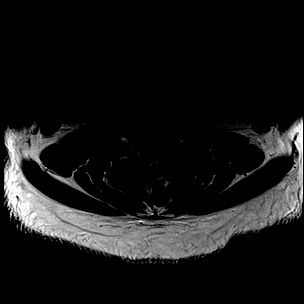
[im 28/40]
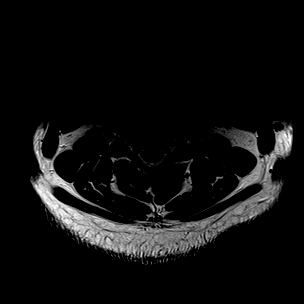
[im 34/40]
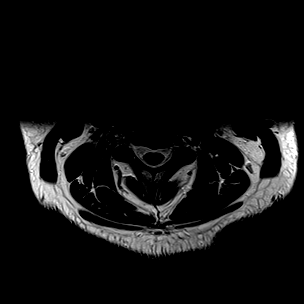
[im 40/40]
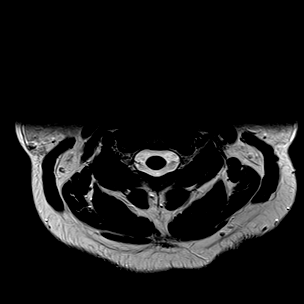

[Series 9: GRE · axial · 3.0mm · 0.39mm/px · z∈[-119,+8]mm · 8 of 40 slices shown]
[im 1/40]
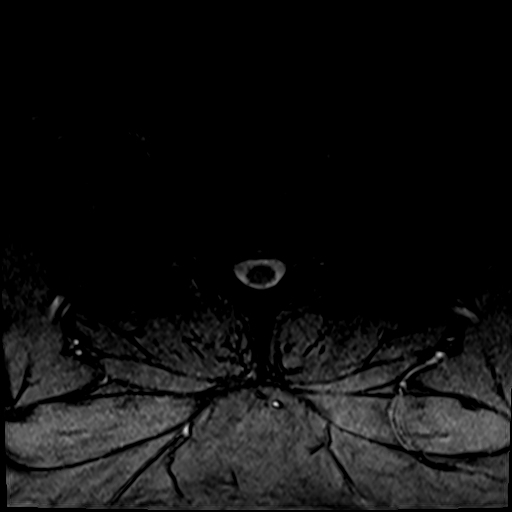
[im 6/40]
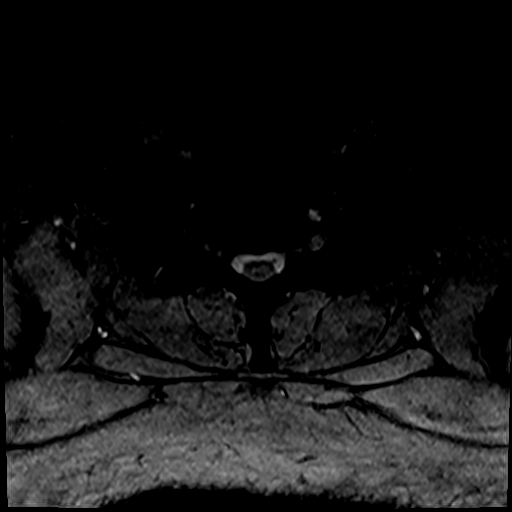
[im 12/40]
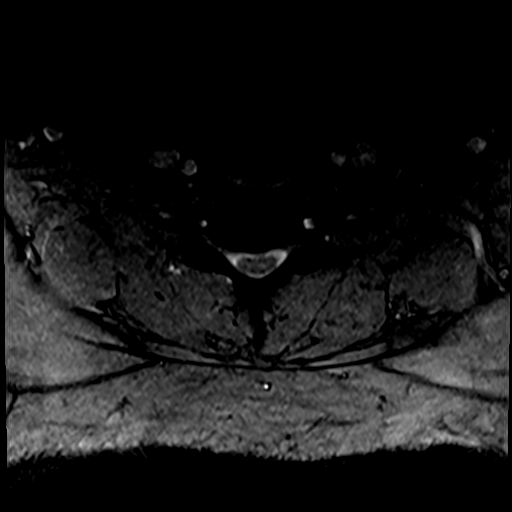
[im 17/40]
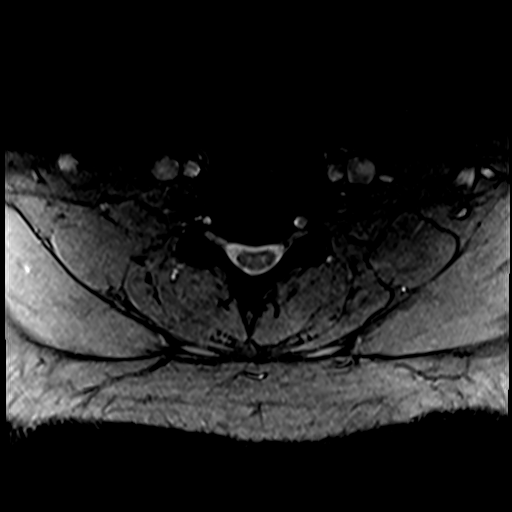
[im 23/40]
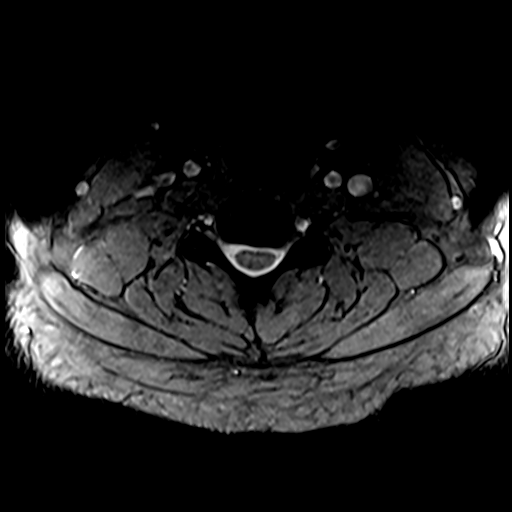
[im 28/40]
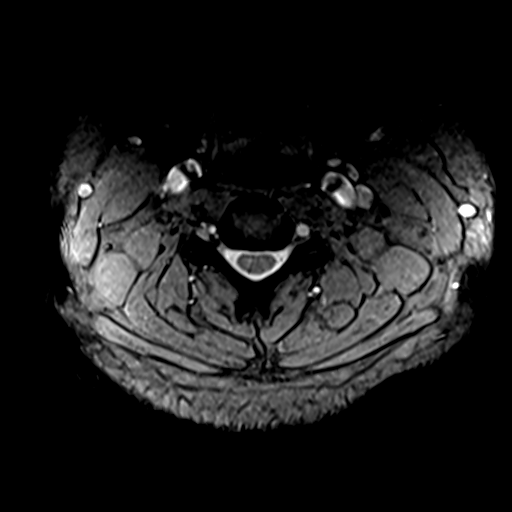
[im 34/40]
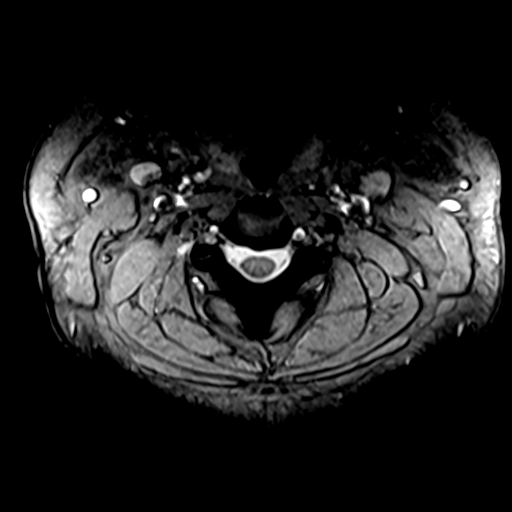
[im 40/40]
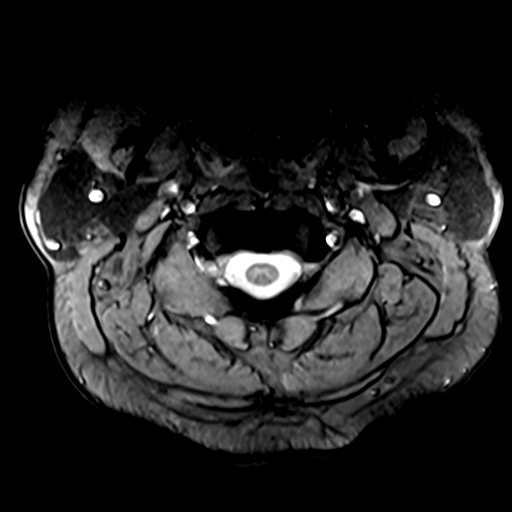

[38 of 48 positions shown; findings below may reference images not displayed]

FINDINGS: Alignment: Straightening of the normal cervical lordosis.

Vertebrae: No fracture or primary bone lesion.

Cord: No cord compression or primary cord lesion.

Posterior Fossa, vertebral arteries, paraspinal tissues: Negative

Disc levels:

Foramen magnum, C1-2 and C2-3 are normal.

C3-4: Minimal disc bulge.  No canal or foraminal stenosis.

C4-5: Mild left-sided uncovertebral degenerative change. No canal
stenosis. Mild left foraminal narrowing. Some potential this could
affect the left C5 nerve.

C5-6: Normal interspace.

C6-7: Spondylosis with endplate osteophytes and bulging of the disc.
Narrowing of the ventral subarachnoid space but no compression of
the cord. AP diameter of the canal in the midline 8 mm. Bilateral
foraminal encroachment that could affect either C7 nerve.

C7-T1: Normal interspace.

T1-2 and T2-3: Noncompressive disc bulges.
IMPRESSION: Degenerative changes throughout the cervical and upper thoracic
region as outlined above. No compressive central canal stenosis.
Bilateral foraminal narrowing at C6-7 that would have some potential
to affect either C7 nerve. Lesser foraminal narrowing on the left at
C4-5 due to uncovertebral encroachment could possibly affect the
left C5 nerve.

## 2020-05-11 ENCOUNTER — Other Ambulatory Visit: Payer: Self-pay | Admitting: *Deleted

## 2020-05-11 DIAGNOSIS — Z79899 Other long term (current) drug therapy: Secondary | ICD-10-CM | POA: Diagnosis not present

## 2020-05-12 LAB — CBC WITH DIFFERENTIAL/PLATELET
Absolute Monocytes: 532 cells/uL (ref 200–950)
Basophils Absolute: 38 cells/uL (ref 0–200)
Basophils Relative: 0.5 %
Eosinophils Absolute: 175 cells/uL (ref 15–500)
Eosinophils Relative: 2.3 %
HCT: 46.3 % (ref 38.5–50.0)
Hemoglobin: 15.8 g/dL (ref 13.2–17.1)
Lymphs Abs: 2911 cells/uL (ref 850–3900)
MCH: 31.6 pg (ref 27.0–33.0)
MCHC: 34.1 g/dL (ref 32.0–36.0)
MCV: 92.6 fL (ref 80.0–100.0)
MPV: 10 fL (ref 7.5–12.5)
Monocytes Relative: 7 %
Neutro Abs: 3944 cells/uL (ref 1500–7800)
Neutrophils Relative %: 51.9 %
Platelets: 248 10*3/uL (ref 140–400)
RBC: 5 10*6/uL (ref 4.20–5.80)
RDW: 12.5 % (ref 11.0–15.0)
Total Lymphocyte: 38.3 %
WBC: 7.6 10*3/uL (ref 3.8–10.8)

## 2020-05-12 LAB — COMPLETE METABOLIC PANEL WITH GFR
AG Ratio: 1.8 (calc) (ref 1.0–2.5)
ALT: 45 U/L (ref 9–46)
AST: 27 U/L (ref 10–40)
Albumin: 4.8 g/dL (ref 3.6–5.1)
Alkaline phosphatase (APISO): 71 U/L (ref 36–130)
BUN: 13 mg/dL (ref 7–25)
CO2: 28 mmol/L (ref 20–32)
Calcium: 9.7 mg/dL (ref 8.6–10.3)
Chloride: 103 mmol/L (ref 98–110)
Creat: 0.84 mg/dL (ref 0.60–1.35)
GFR, Est African American: 129 mL/min/{1.73_m2} (ref >=60)
GFR, Est Non African American: 111 mL/min/{1.73_m2} (ref >=60)
Globulin: 2.6 g/dL (calc) (ref 1.9–3.7)
Glucose, Bld: 89 mg/dL (ref 65–99)
Potassium: 4.5 mmol/L (ref 3.5–5.3)
Sodium: 140 mmol/L (ref 135–146)
Total Bilirubin: 0.7 mg/dL (ref 0.2–1.2)
Total Protein: 7.4 g/dL (ref 6.1–8.1)

## 2020-05-12 NOTE — Progress Notes (Signed)
CBC and CMP are normal.

## 2020-05-22 ENCOUNTER — Encounter: Payer: Self-pay | Admitting: Rheumatology

## 2020-05-29 DIAGNOSIS — E662 Morbid (severe) obesity with alveolar hypoventilation: Secondary | ICD-10-CM | POA: Diagnosis not present

## 2020-05-29 DIAGNOSIS — R5383 Other fatigue: Secondary | ICD-10-CM | POA: Diagnosis not present

## 2020-05-29 DIAGNOSIS — G4733 Obstructive sleep apnea (adult) (pediatric): Secondary | ICD-10-CM | POA: Diagnosis not present

## 2020-06-01 ENCOUNTER — Encounter: Payer: Self-pay | Admitting: Rheumatology

## 2020-06-01 MED ORDER — ALLOPURINOL 300 MG PO TABS
300.0000 mg | ORAL_TABLET | Freq: Every day | ORAL | 0 refills | Status: DC
Start: 2020-06-01 — End: 2020-09-14

## 2020-06-01 NOTE — Telephone Encounter (Signed)
Last Visit: 02/09/2020 Next Visit: 07/11/2020 Labs: 05/11/2020 CBC and CMP are normal.  Current Dose per office note 02/09/2020: allopurinol 300 mg by mouth daily  DX: Idiopathic chronic gout of multiple sites without tophus   Last Fill: 06/29/2018  Okay to refill per Dr. Corliss Skains

## 2020-06-05 HISTORY — PX: ROTATOR CUFF REPAIR: SHX139

## 2020-06-29 NOTE — Progress Notes (Signed)
Office Visit Note  Patient: Miguel Evans             Date of Birth: January 05, 1981           MRN: [id number]             PCP: Nonnie Done., MD Referring: Nonnie Done., MD Visit Date: 07/11/2020 Occupation: @GUAROCC @  Subjective:  Other (Patient discontinued MTX approximately 6 months. Left knee cyst has returned.)   History of Present Illness: Miguel Evans is a 40 y.o. male with history of psoriatic arthritis, psoriasis and gout.  He states he continues to have discomfort behind his left knee from the Baker's cyst.  He was seen by an orthopedic surgeon in November 2021.  Who aspirated and injected it with the cortisone.  But he had recurrence of the Baker's cyst 2 weeks later.  He states none of the other joints are painful.  He came off methotrexate about 6 months ago and has not noticed any worsening of his symptoms.  He denies any recent episodes of dactylitis, plantar fasciitis, Achilles tendinitis or iritis.  There is no clear shortness of breath.  He states that he has a new job and he has been lifting heavy crates of wine.  He injured his left shoulder.  He underwent left rotator cuff tear repair in January 2022.  He is still recovering from it.  He denies any psoriasis lesions.  Activities of Daily Living:  Patient reports morning stiffness for 45-60 minutes.   Patient Reports nocturnal pain.  Difficulty dressing/grooming: Denies Difficulty climbing stairs: Reports Difficulty getting out of chair: Reports Difficulty using hands for taps, buttons, cutlery, and/or writing: Reports  Review of Systems  Constitutional: Positive for fatigue.  HENT: Positive for mouth dryness and nose dryness. Negative for mouth sores.   Eyes: Negative for pain, itching and dryness.  Respiratory: Negative for shortness of breath and difficulty breathing.   Cardiovascular: Positive for palpitations. Negative for chest pain.  Gastrointestinal: Negative for blood in stool, constipation and diarrhea.   Endocrine: Negative for increased urination.  Genitourinary: Negative for difficulty urinating.  Musculoskeletal: Positive for arthralgias, joint pain, myalgias, morning stiffness, muscle tenderness and myalgias. Negative for joint swelling.  Skin: Negative for color change, rash and redness.  Allergic/Immunologic: Negative for susceptible to infections.  Neurological: Negative for dizziness, numbness, headaches, memory loss and weakness.  Hematological: Positive for bruising/bleeding tendency.  Psychiatric/Behavioral: Negative for confusion. The patient is nervous/anxious.     PMFS History:  Patient Active Problem List   Diagnosis Date Noted  . Cervical disc disorder with radiculopathy 02/22/2019  . Myofascial pain syndrome 02/22/2019  . Tear of right rotator cuff 09/23/2018  . Psoriasis 03/27/2017  . 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 Patellofemoral arthralgia of both knees 02/24/2017  . Hyperuricemia 03/26/2016  . Elevated blood pressure  03/26/2016  . Elevated LFTs 03/26/2016  . Anxiety 03/26/2016  . Psoriatic arthritis (HCC) 03/25/2016  . High risk medication use 03/25/2016  . Cervicalgia 03/25/2016  . Hand pain 03/25/2016  . 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 Anxiety   . Arthritis   . Gout   . Hypertension   . Psoriasis   . Psoriatic arthritis (HCC)   . Sleep apnea     Family History  Problem Relation Age of Onset  . Cancer Mother        breast   . Bipolar disorder Mother   . Hypertension Father   .  Osteoarthritis Father   . Emphysema Maternal Grandfather   . CAD Paternal Grandfather   . Healthy Son   . Healthy Son   . Healthy Daughter   . Healthy Daughter    Past Surgical History:  Procedure Laterality Date  . ANTERIOR CRUCIATE LIGAMENT REPAIR Bilateral Left in 2002, Right 2013  . ANTERIOR CRUCIATE LIGAMENT REPAIR Bilateral 2014  . ROTATOR CUFF REPAIR Left 06/05/2020  . SHOULDER ARTHROSCOPY WITH SUBACROMIAL  DECOMPRESSION AND OPEN ROTATOR C Right 09/23/2018   Procedure: SHOULDER ARTHROSCOPY WITH SUBACROMIAL DECOMPRESSION AND OPEN ROTATOR CUFF REPAIR,;  Surgeon: Valeria Batman, MD;  Location: [facility];  Service: Orthopedics;  Laterality: Right;  WITH BLOCK  . VASECTOMY     Social History   Social History Narrative   Right handed   4 children   One story home    There is no immunization history on file for this patient.   Objective: Vital Signs: BP (!) 146/102 (BP Location: Left Arm, Patient Position: Sitting, Cuff Size: Large)   Pulse 93   Resp 17   Ht 6\' 3"  (1.905 m)   Wt (!) 330 lb 9.6 oz (150 kg)   BMI 41.32 kg/m    Physical Exam Vitals and nursing note reviewed.  Constitutional:      Appearance: He is well-developed.  HENT:     Head: Normocephalic and atraumatic.  Eyes:     Conjunctiva/sclera: Conjunctivae normal.     Pupils: Pupils are equal, round, and reactive to light.  Cardiovascular:     Rate and Rhythm: Normal rate and regular rhythm.     Heart sounds: Normal heart sounds.  Pulmonary:     Effort: Pulmonary effort is normal.     Breath sounds: Normal breath sounds.  Abdominal:     General: Bowel sounds are normal.     Palpations: Abdomen is soft.  Musculoskeletal:     Cervical back: Normal range of motion and neck supple.  Skin:    General: Skin is warm and dry.     Capillary Refill: Capillary refill takes less than 2 seconds.  Neurological:     Mental Status: He is alert and oriented to person, place, and time.  Psychiatric:        Behavior: Behavior normal.      Musculoskeletal Exam: He had pain and stiffness with range of motion of his cervical spine.  Shoulder and elbow joints with good range of motion.  He has mild tenderness over left SI joint.  Right shoulder was in full range of motion.  Left shoulder joint abduction was limited to 90 degrees and painful.  Elbow joints, wrist joints, MCPs PIPs and DIPs with good range of motion with no synovitis or  dactylitis.  Hip joints and knee joints in good range of motion.  He had popliteal cyst below the left popliteal fossa.  No evidence of Achilles tendinitis or plantar fasciitis was noted.  There was no tenderness over ankles or MTPs. CDAI Exam: CDAI Score: -- Patient Global: --; Provider Global: -- Swollen: --; Tender: -- Joint Exam 07/11/2020   No joint exam has been documented for this visit   There is currently no information documented on the homunculus. Go to the Rheumatology activity and complete the homunculus joint exam.  Investigation: No additional findings.  Imaging: No results found.  Recent Labs: Lab Results  Component Value Date   WBC 7.6 05/11/2020   HGB 15.8 05/11/2020   PLT 248 05/11/2020   NA 140 05/11/2020  K 4.5 05/11/2020   CL 103 05/11/2020   CO2 28 05/11/2020   GLUCOSE 89 05/11/2020   BUN 13 05/11/2020   CREATININE 0.84 05/11/2020   BILITOT 0.7 05/11/2020   ALKPHOS 74 10/24/2016   AST 27 05/11/2020   ALT 45 05/11/2020   PROT 7.4 05/11/2020   ALBUMIN 4.7 10/24/2016   CALCIUM 9.7 05/11/2020   GFRAA 129 05/11/2020   QFTBGOLDPLUS NEGATIVE 02/09/2020    Speciality Comments: No specialty comments available.  Procedures:  No procedures performed Allergies: Patient has no known allergies.   Assessment / Plan:     Visit Diagnoses: Psoriatic arthritis (HCC) -he is clinically doing well on Taltz.  He discontinued methotrexate 6 months ago due to the cost.  He has not noticed any worsening of his arthritis.  He denies any dactylitis, plantar fasciitis or Achilles tendinitis.  None of the joints are swollen.  He will continue Toltz as a monotherapy.  Sample was given and prescription refill was given today.  Plan: Ixekizumab (TALTZ) 80 MG/ML SOAJ  Psoriasis-he has no active psoriasis lesions.  High risk medication use - Taltz 80 mg sq injection every 28 days started in October 2020, (methotrexate 0.6 mL sq weekly and folic acid 2 mg p.o. daily.  -Discontinued)  S/P right rotator cuff repair - Performed by Dr. Cleophas Dunker in June 2020.  Doing well he had good range of motion.  Chronic left shoulder pain - left RCT repair 04/2020 by Dr. Ave Filter through worker's comp.  He had 90 degree abduction.  Primary osteoarthritis of both knees-he continues to have some discomfort in his knee joints.  Chronic left SI joint pain-chronic.  Synovial cyst of left popliteal space-he had recurrent left popliteal cyst.  He had aspiration in November by an orthopedic surgeon.  We discussed resection of popliteal cyst but he is not prepared for it currently.  Idiopathic chronic gout of multiple sites without tophus - allopurinol 300 mg by mouth daily and Mitigare 0.6 mg daily prn.  Uric acid level on February 09, 2020 was 6.8.  He has not had any gout flare.  I will check uric acid level with the next labs.  DDD (degenerative disc disease), cervical - He had an epidural steroid injection performed by Dr. Ezzard Standing on 03/02/2019.  He continues to have neck discomfort as he has been lifting heavy 20.  Fibromyalgia -he continues to have some generalized pain and discomfort.  He states Cymbalta 30 mg twice daily is helpful.   History of hypertension-his blood pressure is elevated today.  He states he has been under a lot of stress.  Increased risk of heart disease with psoriatic arthritis was discussed.  Handout was placed in the AVS.  Weight loss diet and exercise was emphasized.  History of sleep apnea - CPAP  Orders: Orders Placed This Encounter  Procedures  . Uric acid   Meds ordered this encounter  Medications  . Ixekizumab (TALTZ) 80 MG/ML SOAJ    Sig: INJECT 80 MG UNDER THE SKIN EVERY 28 DAYS    Dispense:  3 mL    Refill:  0   .  Follow-Up Instructions: Return in about 5 months (around 12/11/2020) for Psoriatic arthritis, Gout.   Pollyann Savoy, MD  Note - This record has been created using Animal nutritionist.  Chart creation errors have been  sought, but may not always  have been located. Such creation errors do not reflect on  the standard of medical care.

## 2020-07-09 DIAGNOSIS — G4733 Obstructive sleep apnea (adult) (pediatric): Secondary | ICD-10-CM | POA: Diagnosis not present

## 2020-07-11 ENCOUNTER — Ambulatory Visit: Payer: BC Managed Care – PPO | Admitting: Rheumatology

## 2020-07-11 ENCOUNTER — Telehealth: Payer: Self-pay | Admitting: Pharmacist

## 2020-07-11 ENCOUNTER — Encounter: Payer: Self-pay | Admitting: Rheumatology

## 2020-07-11 ENCOUNTER — Other Ambulatory Visit: Payer: Self-pay

## 2020-07-11 ENCOUNTER — Other Ambulatory Visit: Payer: Self-pay | Admitting: Rheumatology

## 2020-07-11 VITALS — BP 146/102 | HR 93 | Resp 17 | Ht 75.0 in | Wt 330.6 lb

## 2020-07-11 DIAGNOSIS — M7122 Synovial cyst of popliteal space [Baker], left knee: Secondary | ICD-10-CM

## 2020-07-11 DIAGNOSIS — G8929 Other chronic pain: Secondary | ICD-10-CM

## 2020-07-11 DIAGNOSIS — M170 Bilateral primary osteoarthritis of knee: Secondary | ICD-10-CM

## 2020-07-11 DIAGNOSIS — L409 Psoriasis, unspecified: Secondary | ICD-10-CM | POA: Diagnosis not present

## 2020-07-11 DIAGNOSIS — M533 Sacrococcygeal disorders, not elsewhere classified: Secondary | ICD-10-CM

## 2020-07-11 DIAGNOSIS — Z8679 Personal history of other diseases of the circulatory system: Secondary | ICD-10-CM

## 2020-07-11 DIAGNOSIS — Z9889 Other specified postprocedural states: Secondary | ICD-10-CM | POA: Diagnosis not present

## 2020-07-11 DIAGNOSIS — L4050 Arthropathic psoriasis, unspecified: Secondary | ICD-10-CM

## 2020-07-11 DIAGNOSIS — Z8669 Personal history of other diseases of the nervous system and sense organs: Secondary | ICD-10-CM

## 2020-07-11 DIAGNOSIS — M5412 Radiculopathy, cervical region: Secondary | ICD-10-CM

## 2020-07-11 DIAGNOSIS — Z79899 Other long term (current) drug therapy: Secondary | ICD-10-CM

## 2020-07-11 DIAGNOSIS — R21 Rash and other nonspecific skin eruption: Secondary | ICD-10-CM

## 2020-07-11 DIAGNOSIS — M5030 Other cervical disc degeneration, unspecified cervical region: Secondary | ICD-10-CM

## 2020-07-11 DIAGNOSIS — M1A09X0 Idiopathic chronic gout, multiple sites, without tophus (tophi): Secondary | ICD-10-CM

## 2020-07-11 DIAGNOSIS — M797 Fibromyalgia: Secondary | ICD-10-CM

## 2020-07-11 DIAGNOSIS — M25512 Pain in left shoulder: Secondary | ICD-10-CM

## 2020-07-11 MED ORDER — TALTZ 80 MG/ML ~~LOC~~ SOAJ: SUBCUTANEOUS | 0 refills | Status: DC

## 2020-07-11 NOTE — Progress Notes (Signed)
Medication Samples have been provided to the patient.  Drug name: Altamease Oiler   Strength: 80mg /mL        Qty: 1  LOT AC  Exp.Date: 04/15/2022  Dosing instructions: Inject 80mg  into the skin every 28 days.

## 2020-07-11 NOTE — Telephone Encounter (Signed)
Patient reports that he continues to have difficulty filling [employer] at [facility]. Was billed for medication without pharmacy using copay card. He would like to move away from [facility] if possible  Rx sent to [facility] by Dr. Corliss Skains, but test claim shows that patient is locked into [facility]. Will f/u with pharmacy and patient. If [facility] able to fill, may need override with each fill.  Chesley Mires, PharmD, MPH Clinical Pharmacist (Rheumatology and Pulmonology)

## 2020-07-11 NOTE — Patient Instructions (Signed)
Standing Labs We placed an order today for your standing lab work.   Please have your standing labs drawn in April( last week) and then every 3 months  If possible, please have your labs drawn 2 weeks prior to your appointment so that the provider can discuss your results at your appointment.  We have open lab daily Monday through Thursday from 1:30-4:30 PM and Friday from 1:30-4:00 PM at the office of Dr. Pollyann Savoy, [facility].   Please be advised, all patients with office appointments requiring lab work will take precedents over walk-in lab work.  If possible, please come for your lab work on Monday and Friday afternoons, as you may experience shorter wait times. The office is located at [address], [place], [place] [address] No appointment is necessary.   Labs are drawn by [facility]. Please bring your co-pay at the time of your lab draw.  You may receive a bill from [facility] for your lab work.  If you wish to have your labs drawn at another location, please call the office 24 hours in advance to send orders.  If you have any questions regarding directions or hours of operation,  please call [phone].     As a reminder, please drink plenty of water prior to coming for your lab work. Thanks!   Heart Disease Prevention   Your inflammatory disease increases your risk of heart disease which includes heart attack, stroke, atrial fibrillation (irregular heartbeats), high blood pressure, heart failure and atherosclerosis (plaque in the arteries).  It is important to reduce your risk by:   . Keep blood pressure, cholesterol, and blood sugar at healthy levels   . Smoking Cessation   . Maintain a healthy weight  o BMI 20-25   . Eat a healthy diet  o Plenty of fresh fruit, vegetables, and whole grains  o Limit saturated fats, foods high in sodium, and added sugars  o DASH and Mediterranean diet   . Increase physical activity  o Recommend moderate  physically activity for 150 minutes per week/ 30 minutes a day for five days a week These can be broken up into three separate ten-minute sessions during the day.   . Reduce Stress  . Meditation, slow breathing exercises, yoga, coloring books  . Dental visits twice a year

## 2020-07-12 NOTE — Telephone Encounter (Signed)
Spoke with [facility] regarding patient's Taltz.  Rep states that they have two accounts for him and copay card was filed under the account that wasn't being used for fills.  Rep has noted in old account should it should not be used.  Copay card information was transferred to new.  She states she will reach out to patient to schedule shipment and review this information.   Rx was sent to [facility] yesterday by Dr. Corliss Skains but they are unable to fill the medication.  [facility] able to re-adjudicate all bills to $5 with copay card. They've reached out to him to explain this but unable to reach - they left VM requesting he return call to them or to clinic. Will re-send rx after patient's current shipment is delivered. Will f/u  Chesley Mires, PharmD, MPH Clinical Pharmacist (Rheumatology and Pulmonology)

## 2020-07-17 NOTE — Telephone Encounter (Signed)
Called [facility], copay card issues have been corrected, but they have been unable to contact patient to set up next shipment.   Called patient, he will contact pharmacy to schedule shipment and will let office know if he has any issues.

## 2020-08-01 DIAGNOSIS — R1084 Generalized abdominal pain: Secondary | ICD-10-CM | POA: Diagnosis not present

## 2020-08-01 DIAGNOSIS — R519 Headache, unspecified: Secondary | ICD-10-CM | POA: Diagnosis not present

## 2020-08-01 DIAGNOSIS — R112 Nausea with vomiting, unspecified: Secondary | ICD-10-CM | POA: Diagnosis not present

## 2020-08-22 ENCOUNTER — Other Ambulatory Visit: Payer: Self-pay | Admitting: Physician Assistant

## 2020-08-22 DIAGNOSIS — L4050 Arthropathic psoriasis, unspecified: Secondary | ICD-10-CM

## 2020-08-22 NOTE — Telephone Encounter (Signed)
Next Visit: 12/12/2020  Last Visit: 07/11/2020  Last Fill: 07/11/2020  DX: Psoriatic arthritis   Current Dose per office note 07/11/2020, Taltz 80 mg sq injection every 28 days   Labs: 05/11/2020, CBC and CMP are normal.  I called patient,  labs are due.  TB Gold: 02/09/2020, negative  Okay to refill Taltz?

## 2020-08-31 ENCOUNTER — Other Ambulatory Visit: Payer: Self-pay | Admitting: *Deleted

## 2020-08-31 DIAGNOSIS — Z79899 Other long term (current) drug therapy: Secondary | ICD-10-CM

## 2020-09-01 LAB — COMPLETE METABOLIC PANEL WITH GFR
AG Ratio: 2 (calc) (ref 1.0–2.5)
ALT: 63 U/L — ABNORMAL HIGH (ref 9–46)
AST: 45 U/L — ABNORMAL HIGH (ref 10–40)
Albumin: 4.7 g/dL (ref 3.6–5.1)
Alkaline phosphatase (APISO): 72 U/L (ref 36–130)
BUN: 17 mg/dL (ref 7–25)
CO2: 24 mmol/L (ref 20–32)
Calcium: 9.6 mg/dL (ref 8.6–10.3)
Chloride: 105 mmol/L (ref 98–110)
Creat: 1.06 mg/dL (ref 0.60–1.35)
GFR, Est African American: 103 mL/min/{1.73_m2} (ref >=60)
GFR, Est Non African American: 89 mL/min/{1.73_m2} (ref >=60)
Globulin: 2.3 g/dL (calc) (ref 1.9–3.7)
Glucose, Bld: 105 mg/dL — ABNORMAL HIGH (ref 65–99)
Potassium: 4.5 mmol/L (ref 3.5–5.3)
Sodium: 138 mmol/L (ref 135–146)
Total Bilirubin: 0.7 mg/dL (ref 0.2–1.2)
Total Protein: 7 g/dL (ref 6.1–8.1)

## 2020-09-01 LAB — CBC WITH DIFFERENTIAL/PLATELET
Absolute Monocytes: 656 cells/uL (ref 200–950)
Basophils Absolute: 41 cells/uL (ref 0–200)
Basophils Relative: 0.6 %
Eosinophils Absolute: 159 cells/uL (ref 15–500)
Eosinophils Relative: 2.3 %
HCT: 44.1 % (ref 38.5–50.0)
Hemoglobin: 15 g/dL (ref 13.2–17.1)
Lymphs Abs: 2401 cells/uL (ref 850–3900)
MCH: 32.5 pg (ref 27.0–33.0)
MCHC: 34 g/dL (ref 32.0–36.0)
MCV: 95.7 fL (ref 80.0–100.0)
MPV: 9.6 fL (ref 7.5–12.5)
Monocytes Relative: 9.5 %
Neutro Abs: 3643 cells/uL (ref 1500–7800)
Neutrophils Relative %: 52.8 %
Platelets: 190 10*3/uL (ref 140–400)
RBC: 4.61 10*6/uL (ref 4.20–5.80)
RDW: 12.8 % (ref 11.0–15.0)
Total Lymphocyte: 34.8 %
WBC: 6.9 10*3/uL (ref 3.8–10.8)

## 2020-09-01 NOTE — Progress Notes (Signed)
CBC is normal.  Liver functions are again mildly elevated.  Please advise patient to avoid all NSAIDs and alcohol use.

## 2020-09-14 ENCOUNTER — Other Ambulatory Visit: Payer: Self-pay | Admitting: Rheumatology

## 2020-09-14 MED ORDER — ALLOPURINOL 300 MG PO TABS: 300.0000 mg | ORAL_TABLET | Freq: Every day | ORAL | 0 refills | Status: DC

## 2020-09-14 NOTE — Telephone Encounter (Signed)
Next Visit: 12/12/2020  Last Visit: 07/11/2020  Last Fill: 06/01/2020  DX: Psoriatic arthritis   Current Dose per office note 07/11/2020, allopurinol 300 mg by mouth daily   Labs: 08/31/2020, CBC is normal. Liver functions are again mildly elevated. Please advise patient to avoid all NSAIDs and alcohol use.  Uric acid 02/09/2020 Uric acid is 6.8  Okay to refill allopurinol?

## 2020-09-17 DIAGNOSIS — R5383 Other fatigue: Secondary | ICD-10-CM | POA: Diagnosis not present

## 2020-09-17 DIAGNOSIS — F411 Generalized anxiety disorder: Secondary | ICD-10-CM | POA: Diagnosis not present

## 2020-09-17 DIAGNOSIS — Z79899 Other long term (current) drug therapy: Secondary | ICD-10-CM | POA: Diagnosis not present

## 2020-09-17 DIAGNOSIS — I10 Essential (primary) hypertension: Secondary | ICD-10-CM | POA: Diagnosis not present

## 2020-09-19 DIAGNOSIS — R6882 Decreased libido: Secondary | ICD-10-CM | POA: Diagnosis not present

## 2020-09-19 DIAGNOSIS — R5383 Other fatigue: Secondary | ICD-10-CM | POA: Diagnosis not present

## 2020-10-10 DIAGNOSIS — G4733 Obstructive sleep apnea (adult) (pediatric): Secondary | ICD-10-CM | POA: Diagnosis not present

## 2020-10-19 DIAGNOSIS — R6882 Decreased libido: Secondary | ICD-10-CM | POA: Diagnosis not present

## 2020-11-01 DIAGNOSIS — E291 Testicular hypofunction: Secondary | ICD-10-CM | POA: Diagnosis not present

## 2020-11-12 DIAGNOSIS — R5383 Other fatigue: Secondary | ICD-10-CM | POA: Diagnosis not present

## 2020-11-12 DIAGNOSIS — E662 Morbid (severe) obesity with alveolar hypoventilation: Secondary | ICD-10-CM | POA: Diagnosis not present

## 2020-11-12 DIAGNOSIS — G4733 Obstructive sleep apnea (adult) (pediatric): Secondary | ICD-10-CM | POA: Diagnosis not present

## 2020-11-15 ENCOUNTER — Other Ambulatory Visit: Payer: Self-pay | Admitting: Physician Assistant

## 2020-11-15 DIAGNOSIS — L4050 Arthropathic psoriasis, unspecified: Secondary | ICD-10-CM

## 2020-11-15 NOTE — Telephone Encounter (Signed)
Next Visit: 12/12/2020   Last Visit: 07/11/2020   Last Fill: 08/22/2020  DX: Psoriatic arthritis    Current Dose per office note 07/11/2020, Taltz 80 mg sq injection every 28 days    Labs: 08/31/2020 CBC is normal.  Liver functions are again mildly elevated.  TB Gold: 02/09/2020, negative  Noted on appointment note to update CBC/CMP on 12/12/2020  Okay to refill Taltz?

## 2020-11-28 NOTE — Progress Notes (Deleted)
Office Visit Note  Patient: Miguel Evans             Date of Birth: 06/18/1980           MRN: [id number]             PCP: Nonnie Done., MD Referring: Nonnie Done., MD Visit Date: 12/12/2020 Occupation: @GUAROCC @  Subjective:  No chief complaint on file.   History of Present Illness: Miguel Evans is a 40 y.o. male ***   Activities of Daily Living:  Patient reports morning stiffness for *** {minute/hour:19697}.   Patient {ACTIONS;DENIES/REPORTS:21021675::"Denies"} nocturnal pain.  Difficulty dressing/grooming: {ACTIONS;DENIES/REPORTS:21021675::"Denies"} Difficulty climbing stairs: {ACTIONS;DENIES/REPORTS:21021675::"Denies"} Difficulty getting out of chair: {ACTIONS;DENIES/REPORTS:21021675::"Denies"} Difficulty using hands for taps, buttons, cutlery, and/or writing: {ACTIONS;DENIES/REPORTS:21021675::"Denies"}  No Rheumatology ROS completed.   PMFS History:  Patient Active Problem List   Diagnosis Date Noted   Cervical disc disorder with radiculopathy 02/22/2019   Myofascial pain syndrome 02/22/2019   Tear of right rotator cuff 09/23/2018   Psoriasis 03/27/2017   Polyarthritis 02/24/2017   Patellofemoral arthralgia of both knees 02/24/2017   Hyperuricemia 03/26/2016   Elevated blood pressure  03/26/2016   Elevated LFTs 03/26/2016   Anxiety 03/26/2016   Psoriatic arthritis (HCC) 03/25/2016   High risk medication use 03/25/2016   Cervicalgia 03/25/2016   Hand pain 03/25/2016   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    Past Medical History:  Diagnosis Date   Anxiety    Arthritis    Gout    Hypertension    Psoriasis    Psoriatic arthritis (HCC)    Sleep apnea     Family History  Problem Relation Age of Onset   Cancer Mother        breast    Bipolar disorder Mother    Hypertension Father    Osteoarthritis Father    Emphysema Maternal Grandfather    CAD Paternal Grandfather    Healthy Son    Healthy Son    Healthy Daughter    Healthy Daughter    Past  Surgical History:  Procedure Laterality Date   ANTERIOR CRUCIATE LIGAMENT REPAIR Bilateral Left in 2002, Right 2013   ANTERIOR CRUCIATE LIGAMENT REPAIR Bilateral 2014   ROTATOR CUFF REPAIR Left 06/05/2020   SHOULDER ARTHROSCOPY WITH SUBACROMIAL DECOMPRESSION AND OPEN ROTATOR C Right 09/23/2018   Procedure: SHOULDER ARTHROSCOPY WITH SUBACROMIAL DECOMPRESSION AND OPEN ROTATOR CUFF REPAIR,;  Surgeon: 11/23/2018, MD;  Location: [facility];  Service: Orthopedics;  Laterality: Right;  WITH BLOCK   VASECTOMY     Social History   Social History Narrative   Right handed   4 children   One story home    There is no immunization history on file for this patient.   Objective: Vital Signs: There were no vitals taken for this visit.   Physical Exam   Musculoskeletal Exam: ***  CDAI Exam: CDAI Score: -- Patient Global: --; Provider Global: -- Swollen: --; Tender: -- Joint Exam 12/12/2020   No joint exam has been documented for this visit   There is currently no information documented on the homunculus. Go to the Rheumatology activity and complete the homunculus joint exam.  Investigation: No additional findings.  Imaging: No results found.  Recent Labs: Lab Results  Component Value Date   WBC 6.9 08/31/2020   HGB 15.0 08/31/2020   PLT 190 08/31/2020   NA 138 08/31/2020   K 4.5 08/31/2020   CL 105 08/31/2020   CO2 24 08/31/2020  GLUCOSE 105 (H) 08/31/2020   BUN 17 08/31/2020   CREATININE 1.06 08/31/2020   BILITOT 0.7 08/31/2020   ALKPHOS 74 10/24/2016   AST 45 (H) 08/31/2020   ALT 63 (H) 08/31/2020   PROT 7.0 08/31/2020   ALBUMIN 4.7 10/24/2016   CALCIUM 9.6 08/31/2020   GFRAA 103 08/31/2020   QFTBGOLDPLUS NEGATIVE 02/09/2020    Speciality Comments: No specialty comments available.  Procedures:  No procedures performed Allergies: Patient has no known allergies.   Assessment / Plan:     Visit Diagnoses: No diagnosis found.  Orders: No orders of the  defined types were placed in this encounter.  No orders of the defined types were placed in this encounter.   Face-to-face time spent with patient was *** minutes. Greater than 50% of time was spent in counseling and coordination of care.  Follow-Up Instructions: No follow-ups on file.   Ellen Henri, CMA  Note - This record has been created using Animal nutritionist.  Chart creation errors have been sought, but may not always  have been located. Such creation errors do not reflect on  the standard of medical care.

## 2020-12-12 ENCOUNTER — Ambulatory Visit: Payer: BC Managed Care – PPO | Admitting: Physician Assistant

## 2020-12-12 DIAGNOSIS — M797 Fibromyalgia: Secondary | ICD-10-CM

## 2020-12-12 DIAGNOSIS — M5030 Other cervical disc degeneration, unspecified cervical region: Secondary | ICD-10-CM

## 2020-12-12 DIAGNOSIS — M170 Bilateral primary osteoarthritis of knee: Secondary | ICD-10-CM

## 2020-12-12 DIAGNOSIS — L4050 Arthropathic psoriasis, unspecified: Secondary | ICD-10-CM

## 2020-12-12 DIAGNOSIS — Z8669 Personal history of other diseases of the nervous system and sense organs: Secondary | ICD-10-CM

## 2020-12-12 DIAGNOSIS — Z79899 Other long term (current) drug therapy: Secondary | ICD-10-CM

## 2020-12-12 DIAGNOSIS — G8929 Other chronic pain: Secondary | ICD-10-CM

## 2020-12-12 DIAGNOSIS — Z8679 Personal history of other diseases of the circulatory system: Secondary | ICD-10-CM

## 2020-12-12 DIAGNOSIS — M7122 Synovial cyst of popliteal space [Baker], left knee: Secondary | ICD-10-CM

## 2020-12-12 DIAGNOSIS — Z9889 Other specified postprocedural states: Secondary | ICD-10-CM

## 2020-12-12 DIAGNOSIS — M1A09X0 Idiopathic chronic gout, multiple sites, without tophus (tophi): Secondary | ICD-10-CM

## 2020-12-12 DIAGNOSIS — L409 Psoriasis, unspecified: Secondary | ICD-10-CM

## 2020-12-24 ENCOUNTER — Other Ambulatory Visit: Payer: Self-pay | Admitting: Physician Assistant

## 2020-12-25 NOTE — Telephone Encounter (Signed)
Next Visit: due August 2022. Message sent to the front to schedule patient    Last Visit: 07/11/2020   Last Fill: 06/01/2020   DX: Idiopathic chronic gout of multiple sites without tophus    Current Dose per office note 07/11/2020, allopurinol 300 mg by mouth daily    Labs: 08/31/2020, CBC is normal.  Liver functions are again mildly elevated.  Please advise patient to avoid all NSAIDs and alcohol use.   Uric acid 02/09/2020 Uric acid is 6.8   Okay to refill allopurinol?

## 2020-12-25 NOTE — Telephone Encounter (Signed)
I LMOM for patient to call to schedule a follow up appointment that he was due for in September with [name].

## 2020-12-25 NOTE — Telephone Encounter (Signed)
Please schedule patient for a follow up visit. Patient was due august 2022. Thanks!

## 2021-01-09 NOTE — Telephone Encounter (Signed)
If he is able to update lab work we can provide a sample of taltz.

## 2021-02-12 ENCOUNTER — Encounter: Payer: Self-pay | Admitting: Rheumatology

## 2021-02-12 MED ORDER — PREDNISONE 5 MG PO TABS: ORAL_TABLET | ORAL | 0 refills | Status: DC

## 2021-03-20 NOTE — Progress Notes (Deleted)
Office Visit Note  Patient: Miguel Evans             Date of Birth: 19-Oct-1980           MRN: [id number]             PCP: Enid Skeens., MD Referring: Enid Skeens., MD Visit Date: 03/21/2021 Occupation: @GUAROCC @  Subjective:  No chief complaint on file.   History of Present Illness: Miguel Evans is a 40 y.o. male ***   Activities of Daily Living:  Patient reports morning stiffness for *** {minute/hour:19697}.   Patient {ACTIONS;DENIES/REPORTS:21021675::"Denies"} nocturnal pain.  Difficulty dressing/grooming: {ACTIONS;DENIES/REPORTS:21021675::"Denies"} Difficulty climbing stairs: {ACTIONS;DENIES/REPORTS:21021675::"Denies"} Difficulty getting out of chair: {ACTIONS;DENIES/REPORTS:21021675::"Denies"} Difficulty using hands for taps, buttons, cutlery, and/or writing: {ACTIONS;DENIES/REPORTS:21021675::"Denies"}  No Rheumatology ROS completed.   PMFS History:  Patient Active Problem List   Diagnosis Date Noted   Cervical disc disorder with radiculopathy 02/22/2019   Myofascial pain syndrome 02/22/2019   Tear of right rotator cuff 09/23/2018   Psoriasis 03/27/2017   Polyarthritis 02/24/2017   Patellofemoral arthralgia of both knees 02/24/2017   Hyperuricemia 03/26/2016   Elevated blood pressure  03/26/2016   Elevated LFTs 03/26/2016   Anxiety 03/26/2016   Psoriatic arthritis ([place]) 03/25/2016   High risk medication use 03/25/2016   Cervicalgia 03/25/2016   Hand pain 03/25/2016   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    Past Medical History:  Diagnosis Date   Anxiety    Arthritis    Gout    Hypertension    Psoriasis    Psoriatic arthritis ([place])    Sleep apnea     Family History  Problem Relation Age of Onset   Cancer Mother        breast    Bipolar disorder Mother    Hypertension Father    Osteoarthritis Father    Emphysema Maternal Grandfather    CAD Paternal Grandfather    Healthy Son    Healthy Son    Healthy Daughter    Healthy Daughter    Past  Surgical History:  Procedure Laterality Date   ANTERIOR CRUCIATE LIGAMENT REPAIR Bilateral Left in 2002, Right 2013   ANTERIOR CRUCIATE LIGAMENT REPAIR Bilateral 2014   ROTATOR CUFF REPAIR Left 06/05/2020   SHOULDER ARTHROSCOPY WITH SUBACROMIAL DECOMPRESSION AND OPEN ROTATOR C Right 09/23/2018   Procedure: SHOULDER ARTHROSCOPY WITH SUBACROMIAL DECOMPRESSION AND OPEN ROTATOR CUFF REPAIR,;  Surgeon: Garald Balding, MD;  Location: [facility];  Service: Orthopedics;  Laterality: Right;  WITH BLOCK   VASECTOMY     Social History   Social History Narrative   Right handed   4 children   One story home    There is no immunization history on file for this patient.   Objective: Vital Signs: There were no vitals taken for this visit.   Physical Exam   Musculoskeletal Exam: ***  CDAI Exam: CDAI Score: -- Patient Global: --; Provider Global: -- Swollen: --; Tender: -- Joint Exam 03/21/2021   No joint exam has been documented for this visit   There is currently no information documented on the homunculus. Go to the Rheumatology activity and complete the homunculus joint exam.  Investigation: No additional findings.  Imaging: No results found.  Recent Labs: Lab Results  Component Value Date   WBC 6.9 08/31/2020   HGB 15.0 08/31/2020   PLT 190 08/31/2020   NA 138 08/31/2020   K 4.5 08/31/2020   CL 105 08/31/2020   CO2 24 08/31/2020  GLUCOSE 105 (H) 08/31/2020   BUN 17 08/31/2020   CREATININE 1.06 08/31/2020   BILITOT 0.7 08/31/2020   ALKPHOS 74 10/24/2016   AST 45 (H) 08/31/2020   ALT 63 (H) 08/31/2020   PROT 7.0 08/31/2020   ALBUMIN 4.7 10/24/2016   CALCIUM 9.6 08/31/2020   GFRAA 103 08/31/2020   QFTBGOLDPLUS NEGATIVE 02/09/2020    Speciality Comments: No specialty comments available.  Procedures:  No procedures performed Allergies: Patient has no known allergies.   Assessment / Plan:     Visit Diagnoses: No diagnosis found.  Orders: No orders of the  defined types were placed in this encounter.  No orders of the defined types were placed in this encounter.   Face-to-face time spent with patient was *** minutes. Greater than 50% of time was spent in counseling and coordination of care.  Follow-Up Instructions: No follow-ups on file.   Ellen Henri, CMA  Note - This record has been created using Animal nutritionist.  Chart creation errors have been sought, but may not always  have been located. Such creation errors do not reflect on  the standard of medical care.

## 2021-03-21 ENCOUNTER — Ambulatory Visit: Payer: BC Managed Care – PPO | Admitting: Physician Assistant

## 2021-03-21 DIAGNOSIS — M1A09X0 Idiopathic chronic gout, multiple sites, without tophus (tophi): Secondary | ICD-10-CM

## 2021-03-21 DIAGNOSIS — M797 Fibromyalgia: Secondary | ICD-10-CM

## 2021-03-21 DIAGNOSIS — M5030 Other cervical disc degeneration, unspecified cervical region: Secondary | ICD-10-CM

## 2021-03-21 DIAGNOSIS — L409 Psoriasis, unspecified: Secondary | ICD-10-CM

## 2021-03-21 DIAGNOSIS — Z8679 Personal history of other diseases of the circulatory system: Secondary | ICD-10-CM

## 2021-03-21 DIAGNOSIS — Z79899 Other long term (current) drug therapy: Secondary | ICD-10-CM

## 2021-03-21 DIAGNOSIS — G8929 Other chronic pain: Secondary | ICD-10-CM

## 2021-03-21 DIAGNOSIS — Z8669 Personal history of other diseases of the nervous system and sense organs: Secondary | ICD-10-CM

## 2021-03-21 DIAGNOSIS — Z9889 Other specified postprocedural states: Secondary | ICD-10-CM

## 2021-03-21 DIAGNOSIS — M170 Bilateral primary osteoarthritis of knee: Secondary | ICD-10-CM

## 2021-03-21 DIAGNOSIS — M7122 Synovial cyst of popliteal space [Baker], left knee: Secondary | ICD-10-CM

## 2021-03-21 DIAGNOSIS — L4050 Arthropathic psoriasis, unspecified: Secondary | ICD-10-CM

## 2021-03-25 ENCOUNTER — Encounter: Payer: Self-pay | Admitting: Physician Assistant

## 2021-03-25 ENCOUNTER — Ambulatory Visit (INDEPENDENT_AMBULATORY_CARE_PROVIDER_SITE_OTHER): Payer: BC Managed Care – PPO | Admitting: Physician Assistant

## 2021-03-25 ENCOUNTER — Other Ambulatory Visit: Payer: Self-pay

## 2021-03-25 VITALS — BP 154/85 | HR 98 | Resp 15 | Ht 75.0 in | Wt 340.0 lb

## 2021-03-25 DIAGNOSIS — M170 Bilateral primary osteoarthritis of knee: Secondary | ICD-10-CM

## 2021-03-25 DIAGNOSIS — Z9889 Other specified postprocedural states: Secondary | ICD-10-CM

## 2021-03-25 DIAGNOSIS — M1A09X0 Idiopathic chronic gout, multiple sites, without tophus (tophi): Secondary | ICD-10-CM

## 2021-03-25 DIAGNOSIS — L409 Psoriasis, unspecified: Secondary | ICD-10-CM

## 2021-03-25 DIAGNOSIS — L4050 Arthropathic psoriasis, unspecified: Secondary | ICD-10-CM

## 2021-03-25 DIAGNOSIS — Z111 Encounter for screening for respiratory tuberculosis: Secondary | ICD-10-CM

## 2021-03-25 DIAGNOSIS — M5030 Other cervical disc degeneration, unspecified cervical region: Secondary | ICD-10-CM

## 2021-03-25 DIAGNOSIS — M533 Sacrococcygeal disorders, not elsewhere classified: Secondary | ICD-10-CM

## 2021-03-25 DIAGNOSIS — Z8679 Personal history of other diseases of the circulatory system: Secondary | ICD-10-CM

## 2021-03-25 DIAGNOSIS — G8929 Other chronic pain: Secondary | ICD-10-CM

## 2021-03-25 DIAGNOSIS — Z8669 Personal history of other diseases of the nervous system and sense organs: Secondary | ICD-10-CM

## 2021-03-25 DIAGNOSIS — Z79899 Other long term (current) drug therapy: Secondary | ICD-10-CM

## 2021-03-25 DIAGNOSIS — M797 Fibromyalgia: Secondary | ICD-10-CM

## 2021-03-25 DIAGNOSIS — M7122 Synovial cyst of popliteal space [Baker], left knee: Secondary | ICD-10-CM

## 2021-03-25 DIAGNOSIS — M5412 Radiculopathy, cervical region: Secondary | ICD-10-CM

## 2021-03-25 DIAGNOSIS — M25512 Pain in left shoulder: Secondary | ICD-10-CM

## 2021-03-25 NOTE — Progress Notes (Signed)
Office Visit Note  Patient: Miguel Evans             Date of Birth: March 02, 1981           MRN: [id number]             PCP: Nonnie Done., MD Referring: Nonnie Done., MD Visit Date: 03/25/2021 Occupation: @GUAROCC @  Subjective:  Pain in multiple joints   History of Present Illness: Miguel Evans is a 40 y.o. male with history of psoriatic arthritis and osteoarthritis.  He has been off of Taltz for the past 3 months due to changing his job and 24.  He now has insurance that he would like to update lab work and is requesting refills of his medications.  He states that while being off the toilet he has had increased pain in both knee joints especially the left knee.  He has also had issues with right Achilles tendinitis and intermittent SI joint discomfort especially on the left side.  He denies any plantar fasciitis.  He has not had any psoriasis during the gap in therapy.  He denies any recent infections. He states last week he experienced a gout flare in his right great toe which last 3 to 4 days.  He states that the day before the flare he ate oysters which triggered his symptoms.  He states he continues to take allopurinol 300 mg 1 tablet daily and would like a refill today.  He did not have any colchicine on hand to take during the flare but his symptoms have improved significantly.   Activities of Daily Living:  Patient reports morning stiffness for 1.5 hours.   Patient Reports nocturnal pain.  Difficulty dressing/grooming: Reports Difficulty climbing stairs: Reports Difficulty getting out of chair: Reports Difficulty using hands for taps, buttons, cutlery, and/or writing: Reports  Review of Systems  Constitutional:  Negative for fatigue.  HENT:  Positive for mouth dryness.   Eyes:  Negative for dryness.  Respiratory:  Negative for shortness of breath.   Cardiovascular:  Positive for swelling in legs/feet.  Gastrointestinal:  Negative for constipation.  Endocrine:  Positive for heat intolerance.  Genitourinary:  Negative for difficulty urinating.  Musculoskeletal:  Positive for joint pain, joint pain, joint swelling, muscle weakness, morning stiffness and muscle tenderness.  Skin:  Negative for rash.  Allergic/Immunologic: Negative for susceptible to infections.  Neurological:  Positive for numbness and weakness.  Hematological:  Positive for bruising/bleeding tendency.  Psychiatric/Behavioral:  Negative for sleep disturbance.    PMFS History:  Patient Active Problem List   Diagnosis Date Noted   Cervical disc disorder with radiculopathy 02/22/2019   Myofascial pain syndrome 02/22/2019   Tear of right rotator cuff 09/23/2018   Psoriasis 03/27/2017   Polyarthritis 02/24/2017   Patellofemoral arthralgia of both knees 02/24/2017   Hyperuricemia 03/26/2016   Elevated blood pressure  03/26/2016   Elevated LFTs 03/26/2016   Anxiety 03/26/2016   Psoriatic arthritis ([facility]) 03/25/2016   High risk medication use 03/25/2016   Cervicalgia 03/25/2016   Hand pain 03/25/2016   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    Past Medical History:  Diagnosis Date   Anxiety    Arthritis    Gout    Hypertension    Psoriasis    Psoriatic arthritis ([facility])    Sleep apnea     Family History  Problem Relation Age of Onset   Cancer Mother        breast    Bipolar disorder  Mother    Hypertension Father    Osteoarthritis Father    Emphysema Maternal Grandfather    CAD Paternal Grandfather    Healthy Son    Healthy Son    Healthy Daughter    Healthy Daughter    Past Surgical History:  Procedure Laterality Date   ANTERIOR CRUCIATE LIGAMENT REPAIR Bilateral Left in 2002, Right 2013   ANTERIOR CRUCIATE LIGAMENT REPAIR Bilateral 2014   ROTATOR CUFF REPAIR Left 06/05/2020   SHOULDER ARTHROSCOPY WITH SUBACROMIAL DECOMPRESSION AND OPEN ROTATOR C Right 09/23/2018   Procedure: SHOULDER ARTHROSCOPY WITH SUBACROMIAL DECOMPRESSION AND OPEN ROTATOR CUFF REPAIR,;   Surgeon: Garald Balding, MD;  Location: [facility];  Service: Orthopedics;  Laterality: Right;  WITH BLOCK   VASECTOMY     Social History   Social History Narrative   Right handed   4 children   One story home    There is no immunization history on file for this patient.   Objective: Vital Signs: BP (!) 154/85 (BP Location: Left Arm, Patient Position: Sitting, Cuff Size: Normal)   Pulse 98   Resp 15   Ht 6\' 3"  (1.905 m)   Wt (!) 340 lb (154.2 kg)   BMI 42.50 kg/m    Physical Exam Vitals and nursing note reviewed.  Constitutional:      Appearance: He is well-developed.  HENT:     Head: Normocephalic and atraumatic.  Eyes:     Conjunctiva/sclera: Conjunctivae normal.     Pupils: Pupils are equal, round, and reactive to light.  Pulmonary:     Effort: Pulmonary effort is normal.  Abdominal:     Palpations: Abdomen is soft.  Musculoskeletal:     Cervical back: Normal range of motion and neck supple.  Skin:    General: Skin is warm and dry.     Capillary Refill: Capillary refill takes less than 2 seconds.  Neurological:     Mental Status: He is alert and oriented to person, place, and time.  Psychiatric:        Behavior: Behavior normal.     Musculoskeletal Exam: C-spine has good ROM with some discomfort with lateral rotation.  Midline spinal tenderness in the Cervical region.  Shoulder joints have good ROM with no discomfort.  Tenderness and warmth along the left elbow joint line.  Wrist joints, MCPs, PIPs ,and DIPs good ROM with no synovitis.  Complete fist formation bilaterally.  Hip joints good ROM. Discomfort with ROM of both knee joints with crepitus.  No warmth or effusion of knee joints.  Baker's cyst palpable behind the left knee.  Ankle joints have good ROM with some tenderness in the right ankle.  Right achilles tendonitis.  No evidence of plantar fascitis.  Tenderness, swelling, and erythema of the right 1st PIP joint.   CDAI Exam: CDAI Score: -- Patient  Global: --; Provider Global: -- Swollen: 1 ; Tender: 4  Joint Exam 03/25/2021      Right  Left  Elbow      Tender  Knee   Tender   Tender  PIP 1 (toe)  Swollen Tender        Investigation: No additional findings.  Imaging: No results found.  Recent Labs: Lab Results  Component Value Date   WBC 6.9 08/31/2020   HGB 15.0 08/31/2020   PLT 190 08/31/2020   NA 138 08/31/2020   K 4.5 08/31/2020   CL 105 08/31/2020   CO2 24 08/31/2020   GLUCOSE 105 (H) 08/31/2020  BUN 17 08/31/2020   CREATININE 1.06 08/31/2020   BILITOT 0.7 08/31/2020   ALKPHOS 74 10/24/2016   AST 45 (H) 08/31/2020   ALT 63 (H) 08/31/2020   PROT 7.0 08/31/2020   ALBUMIN 4.7 10/24/2016   CALCIUM 9.6 08/31/2020   GFRAA 103 08/31/2020   QFTBGOLDPLUS NEGATIVE 02/09/2020    Speciality Comments: No specialty comments available.  Procedures:  No procedures performed Allergies: Patient has no known allergies.   Assessment / Plan:     Visit Diagnoses: Psoriatic arthritis [facility]): He presents today with increased pain and stiffness involving multiple joints since he has been off of [place] for the past 3 months.  He had a change in jobs and had a lapse in his insurance during that time.  He has new insurance and would like to have updated lab work as well as refills sent to the pharmacy pending lab results.  While off of Donnetta Hail has been experiencing increased pain in both knee joints and has had recurrent Achilles tendinitis in the right foot.  He has tenderness over the left SI joint on examination today.  No evidence of plantar fasciitis was noted.  He has no active psoriasis at this time.  The plan is to update CBC, CMP, and TB Gold today.  A prescription for Donnetta Hail will be sent to the pharmacy pending lab results.  He was advised to notify us if his symptoms persist or worsen after restarting on Taltz.  He will follow up in 3 months.   Psoriasis: He has no active psoriasis at this time.   High risk medication use -  Taltz 80 mg sq injections every month.  CBC and CMP updated on 08/31/20. He is overdue to update lab work.  Orders for CBC and CMP released today.  His next lab work will be due in March and every 3 months.  Standing orders for CBC and CMP were placed today.  TB gold negative on 02/09/20.  Order for TB gold released today.  Plan: CBC with Differential/Platelet, COMPLETE METABOLIC PANEL WITH GFR, QuantiFERON-TB Gold Plus He has not had any recent infections.  Discussed the importance of holding taltz if he develops signs or symptoms of an infection and to resume once the infection has completely cleared.  Screening for tuberculosis -TB gold released. Plan: QuantiFERON-TB Gold Plus  S/P right rotator cuff repair: Doing well. He had good ROM of the right shoulder with no discomfort.   Chronic left shoulder pain: He has good ROM of the left shoulder joint with no discomfort on examination today.   Primary osteoarthritis of both knees: He has good ROM of both knee joints with crepitus bilaterally.   No warmth or effusion of knee joints noted.  Baker's cyst palpable behind the left knee. Discomfort of the left knee joint with ROM and walking prolonged distances.    Chronic left SI joint pain: He has tenderness over the left SI joint on examination today. He will be resuming taltz pending lab results drawn today.  Advised the patient to notify us if his discomfort persists or worsens.    Synovial cyst of left popliteal space: Unchanged, palpable baker's cyst present.   Idiopathic chronic gout of multiple sites without tophus -He had a gout flare in his right great toe last week.  Prior to the onset of the flare he had oysters which triggered his symptoms.  He has tenderness, erythema, inflammation in the right first PIP joint on examination today.  His symptoms have gradually  been improving after 3 to 4 days.  His uric acid was 6.8 on 02/09/2020.  He has been taking allopurinol 300 mg 1 tablet daily.   Discussed the importance of avoiding a purine rich diet, alcohol use, and high fructose corn syrup.  Uric acid will be checked today.  He was advised to notify us if he develops signs or symptoms of recurrent gout flares.  A refill of allopurinol and colchicine will be sent to the pharmacy.  Plan: Uric acid  DDD (degenerative disc disease), cervical: He has good range of motion of the C-spine with discomfort and stiffness.  Midline spinal tenderness in the cervical spine.  No symptoms of radiculopathy.  Cervical radiculopathy: He has not currently experiencing any symptoms of radiculopathy.  Fibromyalgia: He has intermittent myalgias and muscle tenderness due to fibromyalgia.  He experiences increased arthralgias, myalgias, and joint stiffness with colder weather temperatures.  She is taking Cymbalta 30 mg 2 capsules by mouth daily.  Other medical conditions are listed as follows:   History of hypertension  History of sleep apnea    Orders: Orders Placed This Encounter  Procedures   CBC with Differential/Platelet   COMPLETE METABOLIC PANEL WITH GFR   QuantiFERON-TB Gold Plus   Uric acid   CBC with Differential/Platelet   COMPLETE METABOLIC PANEL WITH GFR   No orders of the defined types were placed in this encounter.    Follow-Up Instructions: Return in about 3 months (around 06/23/2021) for Psoriatic arthritis.   Ofilia Neas, PA-C  Note - This record has been created using Dragon software.  Chart creation errors have been sought, but may not always  have been located. Such creation errors do not reflect on  the standard of medical care.,

## 2021-03-25 NOTE — Patient Instructions (Signed)
Standing Labs °We placed an order today for your standing lab work.  ° °Please have your standing labs drawn in March and every 3 months  ° ° °If possible, please have your labs drawn 2 weeks prior to your appointment so that the provider can discuss your results at your appointment. ° °Please note that you may see your imaging and lab results in MyChart before we have reviewed them. °We may be awaiting multiple results to interpret others before contacting you. °Please allow our office up to 72 hours to thoroughly review all of the results before contacting the office for clarification of your results. ° °We have open lab daily: °Monday through Thursday from 1:30-4:30 PM and Friday from 1:30-4:00 PM °at the office of Dr. Shaili Deveshwar, [facility].   °Please be advised, all patients with office appointments requiring lab work will take precedent over walk-in lab work.  °If possible, please come for your lab work on Monday and Friday afternoons, as you may experience shorter wait times. °The office is located at [address], [address], [place], [place] [address] °No appointment is necessary.   °Labs are drawn by [facility]. Please bring your co-pay at the time of your lab draw.  You may receive a bill from [facility] for your lab work. ° °If you wish to have your labs drawn at another location, please call the office 24 hours in advance to send orders. ° °If you have any questions regarding directions or hours of operation,  °please call [phone].   °As a reminder, please drink plenty of water prior to coming for your lab work. Thanks! ° °

## 2021-03-26 ENCOUNTER — Other Ambulatory Visit: Payer: Self-pay | Admitting: *Deleted

## 2021-03-26 DIAGNOSIS — M1A09X0 Idiopathic chronic gout, multiple sites, without tophus (tophi): Secondary | ICD-10-CM

## 2021-03-26 MED ORDER — COLCHICINE 0.6 MG PO TABS: 0.6000 mg | ORAL_TABLET | Freq: Every day | ORAL | 0 refills | Status: DC

## 2021-03-26 MED ORDER — ALLOPURINOL 300 MG PO TABS
300.0000 mg | ORAL_TABLET | Freq: Every day | ORAL | 0 refills | Status: DC
Start: 2021-03-26 — End: 2021-06-24

## 2021-03-26 NOTE — Progress Notes (Signed)
Ok to refill allopurinol and colchicine.  TB gold is pending.  Altamease Oiler can be sent to the pharmacy pending TB gold results.

## 2021-03-26 NOTE — Progress Notes (Signed)
CBC and CMP WNL.  Uric acid is slightly elevated-6.3. Ideally his uric acid should be less than 6.  Please advise the patient to avoid a purine rich diet and alcohol use.

## 2021-03-26 NOTE — Telephone Encounter (Signed)
Next Visit: 03/25/2021  Last Visit: 06/24/2021  Last Fill: Allopurinol 12/25/2020, Colchicine 07/13/2017  DX: Idiopathic chronic gout of multiple sites without tophus   Current Dose per office note 03/25/2021: allopurinol 300 mg 1 tablet daily, colchicine dose not mentioned  Labs: 03/25/2021, CBC and CMP WNL.  Uric acid is slightly elevated-6.3.  Okay to refill Colchicine and Allopurinol?

## 2021-03-27 ENCOUNTER — Other Ambulatory Visit: Payer: Self-pay | Admitting: *Deleted

## 2021-03-27 DIAGNOSIS — L4050 Arthropathic psoriasis, unspecified: Secondary | ICD-10-CM

## 2021-03-27 LAB — CBC WITH DIFFERENTIAL/PLATELET
Absolute Monocytes: 665 cells/uL (ref 200–950)
Basophils Absolute: 31 cells/uL (ref 0–200)
Basophils Relative: 0.5 %
Eosinophils Absolute: 153 cells/uL (ref 15–500)
Eosinophils Relative: 2.5 %
HCT: 44.6 % (ref 38.5–50.0)
Hemoglobin: 14.9 g/dL (ref 13.2–17.1)
Lymphs Abs: 2135 cells/uL (ref 850–3900)
MCH: 31.2 pg (ref 27.0–33.0)
MCHC: 33.4 g/dL (ref 32.0–36.0)
MCV: 93.5 fL (ref 80.0–100.0)
MPV: 9.6 fL (ref 7.5–12.5)
Monocytes Relative: 10.9 %
Neutro Abs: 3117 cells/uL (ref 1500–7800)
Neutrophils Relative %: 51.1 %
Platelets: 235 10*3/uL (ref 140–400)
RBC: 4.77 10*6/uL (ref 4.20–5.80)
RDW: 12.7 % (ref 11.0–15.0)
Total Lymphocyte: 35 %
WBC: 6.1 10*3/uL (ref 3.8–10.8)

## 2021-03-27 LAB — COMPLETE METABOLIC PANEL WITH GFR
AG Ratio: 1.7 (calc) (ref 1.0–2.5)
ALT: 38 U/L (ref 9–46)
AST: 25 U/L (ref 10–40)
Albumin: 4.5 g/dL (ref 3.6–5.1)
Alkaline phosphatase (APISO): 72 U/L (ref 36–130)
BUN: 14 mg/dL (ref 7–25)
CO2: 30 mmol/L (ref 20–32)
Calcium: 9.3 mg/dL (ref 8.6–10.3)
Chloride: 103 mmol/L (ref 98–110)
Creat: 1.07 mg/dL (ref 0.60–1.26)
Globulin: 2.6 g/dL (calc) (ref 1.9–3.7)
Glucose, Bld: 91 mg/dL (ref 65–99)
Potassium: 4.6 mmol/L (ref 3.5–5.3)
Sodium: 140 mmol/L (ref 135–146)
Total Bilirubin: 0.5 mg/dL (ref 0.2–1.2)
Total Protein: 7.1 g/dL (ref 6.1–8.1)
eGFR: 91 mL/min/{1.73_m2} (ref >=60)

## 2021-03-27 LAB — QUANTIFERON-TB GOLD PLUS
Mitogen-NIL: >10 IU/mL
NIL: 0.02 IU/mL
QuantiFERON-TB Gold Plus: NEGATIVE
TB1-NIL: 0 IU/mL
TB2-NIL: 0 IU/mL

## 2021-03-27 LAB — URIC ACID: Uric Acid, Serum: 6.3 mg/dL (ref 4.0–8.0)

## 2021-03-27 MED ORDER — TALTZ 80 MG/ML ~~LOC~~ SOAJ
SUBCUTANEOUS | 2 refills | Status: DC
Start: 2021-03-27 — End: 2021-04-09

## 2021-03-27 NOTE — Progress Notes (Signed)
TB gold negative.  Ok to refill taltz.

## 2021-03-27 NOTE — Telephone Encounter (Signed)
-----   Message from Gearldine Bienenstock, PA-C sent at 03/27/2021  8:02 AM EST ----- TB gold negative.  Ok to refill taltz.

## 2021-04-03 ENCOUNTER — Telehealth: Payer: Self-pay | Admitting: Pharmacist

## 2021-04-03 DIAGNOSIS — Z79899 Other long term (current) drug therapy: Secondary | ICD-10-CM

## 2021-04-03 DIAGNOSIS — L409 Psoriasis, unspecified: Secondary | ICD-10-CM

## 2021-04-03 DIAGNOSIS — L4050 Arthropathic psoriasis, unspecified: Secondary | ICD-10-CM

## 2021-04-03 NOTE — Telephone Encounter (Signed)
Received fax from [facility] that patient has had insurance change and must fill Taltz with [facility] moving forward. Per eligibility check, [facility] plan is now effective  Will need auth renewed through new plan  Submitted a Prior Authorization request to [facility] for TALTZ via CoverMyMeds. Will update once we receive a response.  Key: Gerline Legacy, PharmD, MPH, BCPS Clinical Pharmacist (Rheumatology and Pulmonology)

## 2021-04-09 MED ORDER — TALTZ 80 MG/ML ~~LOC~~ SOAJ: SUBCUTANEOUS | 2 refills | Status: DC

## 2021-04-09 NOTE — Telephone Encounter (Signed)
Received notification from [facility] regarding a prior authorization for TALTZ. Authorization has been APPROVED from 04/05/21 to 04/05/22.   Patient must fill through [facility]: [phone]  Authorization # [id number]  Chesley Mires, PharmD, MPH, BCPS Clinical Pharmacist (Rheumatology and Pulmonology)

## 2021-06-10 NOTE — Progress Notes (Signed)
Office Visit Note  Patient: Miguel Evans             Date of Birth: 1980/10/17           MRN: [id number]             PCP: Enid Skeens., MD Referring: Enid Skeens., MD Visit Date: 06/24/2021 Occupation: @GUAROCC @  Subjective:  Left knee-baker's cyst   History of Present Illness: Miguel Evans is a 41 y.o. male with history of psoriatic arthritis, osteoarthritis, gout, and DDD.  He is on taltz 80 mg sq injections every 28 days.  He has noticed about an 80% improvement in his joint pain and inflammation since restarting on Taltz in December 2022.  He has been tolerating the new formulation of Taltz without any side effects or injection site reactions.  The pain he was having in his ankles due to Achilles tendinitis and plantar fasciitis has resolved.  The discomfort and stiffness he was experiencing in his shoulders has improved.  He continues to have crepitus in both knees and has a Baker's cyst behind the left knee which swells intermittently.  He has been experiencing increased pain in both hands but has not noticed any visible swelling.  He is also been experiencing increased pain and stiffness in his neck.  He has tried working with a Restaurant manager, fast food as well as physical therapy in the past.  He has tried dry needling and acupuncture with no improvement in his symptoms. He denies any psoriasis at this time. He remains on allopurinol 300 mg daily.  He denies any signs or symptoms of a gout flare. He has not had any recent infections.    Activities of Daily Living:  Patient reports morning stiffness for 30-60 minutes.   Patient Reports nocturnal pain.  Difficulty dressing/grooming: Denies Difficulty climbing stairs: Reports Difficulty getting out of chair: Reports Difficulty using hands for taps, buttons, cutlery, and/or writing: Reports  Review of Systems  Constitutional:  Positive for fatigue.  HENT:  Positive for nose dryness. Negative for mouth sores and mouth dryness.   Eyes:   Negative for pain, itching and dryness.  Respiratory:  Negative for shortness of breath and difficulty breathing.   Cardiovascular:  Positive for chest pain and palpitations.  Gastrointestinal:  Negative for blood in stool, constipation and diarrhea.  Endocrine: Negative for increased urination.  Genitourinary:  Negative for difficulty urinating.  Musculoskeletal:  Positive for joint pain, joint pain, joint swelling, myalgias, morning stiffness, muscle tenderness and myalgias.  Skin:  Negative for color change, rash and redness.  Allergic/Immunologic: Negative for susceptible to infections.  Neurological:  Positive for numbness and weakness. Negative for dizziness, headaches and memory loss.  Hematological:  Positive for bruising/bleeding tendency.  Psychiatric/Behavioral:  Negative for confusion.    PMFS History:  Patient Active Problem List   Diagnosis Date Noted   Cervical disc disorder with radiculopathy 02/22/2019   Myofascial pain syndrome 02/22/2019   Tear of right rotator cuff 09/23/2018   Psoriasis 03/27/2017   Polyarthritis 02/24/2017   Patellofemoral arthralgia of both knees 02/24/2017   Hyperuricemia 03/26/2016   Elevated blood pressure  03/26/2016   Elevated LFTs 03/26/2016   Anxiety 03/26/2016   Psoriatic arthritis ([place]) 03/25/2016   High risk medication use 03/25/2016   Cervicalgia 03/25/2016   Hand pain 03/25/2016   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    Past Medical History:  Diagnosis Date   Anxiety    Arthritis    Gout  Hypertension    Psoriasis    Psoriatic arthritis ([place])    Sleep apnea     Family History  Problem Relation Age of Onset   Cancer Mother        breast    Bipolar disorder Mother    Hypertension Father    Osteoarthritis Father    Emphysema Maternal Grandfather    CAD Paternal Grandfather    Healthy Son    Healthy Son    Healthy Daughter    Healthy Daughter    Past Surgical History:  Procedure Laterality Date   ANTERIOR  CRUCIATE LIGAMENT REPAIR Bilateral Left in 2002, Right 2013   ANTERIOR CRUCIATE LIGAMENT REPAIR Bilateral 2014   ROTATOR CUFF REPAIR Left 06/05/2020   SHOULDER ARTHROSCOPY WITH SUBACROMIAL DECOMPRESSION AND OPEN ROTATOR C Right 09/23/2018   Procedure: SHOULDER ARTHROSCOPY WITH SUBACROMIAL DECOMPRESSION AND OPEN ROTATOR CUFF REPAIR,;  Surgeon: Garald Balding, MD;  Location: [facility];  Service: Orthopedics;  Laterality: Right;  WITH BLOCK   VASECTOMY     Social History   Social History Narrative   Right handed   4 children   One story home    There is no immunization history on file for this patient.   Objective: Vital Signs: BP (!) 162/100 (BP Location: Left Arm, Patient Position: Sitting, Cuff Size: Large)    Pulse 80    Ht 6\' 3"  (1.905 m)    Wt (!) 333 lb (151 kg)    BMI 41.62 kg/m    Physical Exam Vitals and nursing note reviewed.  Constitutional:      Appearance: He is well-developed.  HENT:     Head: Normocephalic and atraumatic.  Eyes:     Conjunctiva/sclera: Conjunctivae normal.     Pupils: Pupils are equal, round, and reactive to light.  Cardiovascular:     Rate and Rhythm: Normal rate and regular rhythm.     Heart sounds: Normal heart sounds.  Pulmonary:     Effort: Pulmonary effort is normal.     Breath sounds: Normal breath sounds.  Abdominal:     General: Bowel sounds are normal.     Palpations: Abdomen is soft.  Musculoskeletal:     Cervical back: Normal range of motion and neck supple.  Skin:    General: Skin is warm and dry.     Capillary Refill: Capillary refill takes less than 2 seconds.  Neurological:     Mental Status: He is alert and oriented to person, place, and time.  Psychiatric:        Behavior: Behavior normal.     Musculoskeletal Exam: C-spine has limited and painful ROM with lateral rotation, especially to the left.  Tenderness over the left SI joint.  No midline spinal tenderness.  Shoulder joints, elbow joints, wrist joints, MCPs, PIPs,  and DIPs good ROM with no synovitis.  Tenderness over the right 2nd and 3rd MCP joints.  Complete fist formation bilaterally.  Hip joints have good ROM.  Knee joints have good ROM with no warmth or effusion.  Small baker's cyst palpable behind left knee.  Ankle joints have good ROM with no tenderness or joint swelling. No evidence of achilles tendonitis or plantar fasciitis. No tenderness over MTP joints.   CDAI Exam: CDAI Score: -- Patient Global: --; Provider Global: -- Swollen: 0 ; Tender: 4  Joint Exam 06/24/2021      Right  Left  MCP 2   Tender     MCP 3   Tender  Sacroiliac      Tender  Knee      Tender     Investigation: No additional findings.  Imaging: No results found.  Recent Labs: Lab Results  Component Value Date   WBC 6.1 03/25/2021   HGB 14.9 03/25/2021   PLT 235 03/25/2021   NA 140 03/25/2021   K 4.6 03/25/2021   CL 103 03/25/2021   CO2 30 03/25/2021   GLUCOSE 91 03/25/2021   BUN 14 03/25/2021   CREATININE 1.07 03/25/2021   BILITOT 0.5 03/25/2021   ALKPHOS 74 10/24/2016   AST 25 03/25/2021   ALT 38 03/25/2021   PROT 7.1 03/25/2021   ALBUMIN 4.7 10/24/2016   CALCIUM 9.3 03/25/2021   GFRAA 103 08/31/2020   QFTBGOLDPLUS NEGATIVE 03/25/2021    Speciality Comments: No specialty comments available.  Procedures:  No procedures performed Allergies: Patient has no known allergies.      Assessment / Plan:     Visit Diagnoses: Psoriatic arthritis ([place]): He has no synovitis or dactylitis on examination today.  He has noticed about an 80% improvement in his joint pain, stiffness, and inflammation since restarting on Taltz in December 2022.  He has been tolerating the new formulation of Taltz without any side effects or injection site reactions and has not missed any doses recently.  He has no active psoriasis at this time.  He has mild tenderness and stiffness in the left SI joint on examination today.  His discomfort from Achilles tendinitis and plantar  fasciitis have resolved since restarting on Taltz.  He has been experiencing increased pain in both hands but has no inflammation on examination today.  Discussed the importance of joint protection and muscle strengthening.  He will remain on Taltz as monotherapy.  He was advised to notify us if he develops signs or symptoms of a flare.  He will follow-up in the office in 3 months.  Association of heart disease with psoriatic arthritis was discussed. Need to monitor blood pressure, cholesterol, and to exercise 30-60 minutes on daily basis was discussed.   Psoriasis: He has no active psoriasis at this time.   High risk medication use - Taltz 80 mg sq injections every month.   CBC and CMP WNL on 03/25/21.  He is due to update CBC and CMP today.  Orders released.  His next lab work will be due in June and every 3 months to monitor for drug toxicity.  TB gold negative on 03/25/21 and will continue to be monitored yearly.  - Plan: CBC with Differential/Platelet, COMPLETE METABOLIC PANEL WITH GFR Discussed the importance of holding taltz if he develops signs or symptoms of an infection and to resume once the infection has completely cleared.   S/P right rotator cuff repair: He experiences intermittent aching in the right shoulder which he attributes to postoperative pain.  He has good range of motion of the right shoulder on examination today.  Chronic left shoulder pain: Chronic pain and stiffness.  Overall his pain level has been tolerable.  Primary osteoarthritis of both knees: He has good range of motion of both knee joints on examination today.  Crepitus of both knees noted.  Baker's cyst palpable behind the left knee.  Chronic left SI joint pain: He has mild tenderness over the left SI joint on examination today.  Overall his symptoms have been tolerable.  He will remain on Taltz as prescribed.  Synovial cyst of left popliteal space: Palpable and mildly tender on exam.  Idiopathic chronic  gout of  multiple sites without tophus - He is taking allopurinol 300 mg 1 tablet daily for management of gout.  He has not had any signs or symptoms of a gout flare.  Uric acid was 6.3 on 03/25/21.  He will remain on the current treatment regimen as prescribed.  He has not needed to take colchicine since his prior to his last office visit.  DDD (degenerative disc disease), cervical: Chronic pain and stiffness.  No symptoms of radiculopathy at this time.  He has tried dry needling, acupuncture, chiropractor, and physical therapy with minimal improvement in his symptoms.  He has been stretching which has started to alleviate his symptoms.  Cervical radiculopathy: He has no symptoms of radiculopathy at this time.  Fibromyalgia - He remains on duloxetine 30 mg 2 capsules by mouth daily.  He experiences intermittent myalgias and muscle tenderness due to fibromyalgia.  Overall his energy level has been stable.  Discussed the importance of regular exercise and good sleep hygiene.  Other medical conditions are listed as follows:   History of sleep apnea  History of hypertension: Discussed the importance of close blood pressure monitoring.   Orders: Orders Placed This Encounter  Procedures   CBC with Differential/Platelet   COMPLETE METABOLIC PANEL WITH GFR   No orders of the defined types were placed in this encounter.    Follow-Up Instructions: Return in about 3 months (around 09/24/2021) for Psoriatic arthritis, Osteoarthritis, Gout, DDD.   Ofilia Neas, PA-C  Note - This record has been created using Dragon software.  Chart creation errors have been sought, but may not always  have been located. Such creation errors do not reflect on  the standard of medical care.

## 2021-06-24 ENCOUNTER — Encounter: Payer: Self-pay | Admitting: Physician Assistant

## 2021-06-24 ENCOUNTER — Other Ambulatory Visit: Payer: Self-pay

## 2021-06-24 ENCOUNTER — Other Ambulatory Visit: Payer: Self-pay | Admitting: Physician Assistant

## 2021-06-24 ENCOUNTER — Ambulatory Visit (INDEPENDENT_AMBULATORY_CARE_PROVIDER_SITE_OTHER): Payer: BC Managed Care – PPO | Admitting: Physician Assistant

## 2021-06-24 VITALS — BP 162/100 | HR 80 | Ht 75.0 in | Wt 333.0 lb

## 2021-06-24 DIAGNOSIS — Z9889 Other specified postprocedural states: Secondary | ICD-10-CM

## 2021-06-24 DIAGNOSIS — G8929 Other chronic pain: Secondary | ICD-10-CM

## 2021-06-24 DIAGNOSIS — M797 Fibromyalgia: Secondary | ICD-10-CM

## 2021-06-24 DIAGNOSIS — M1A09X0 Idiopathic chronic gout, multiple sites, without tophus (tophi): Secondary | ICD-10-CM

## 2021-06-24 DIAGNOSIS — Z79899 Other long term (current) drug therapy: Secondary | ICD-10-CM

## 2021-06-24 DIAGNOSIS — M533 Sacrococcygeal disorders, not elsewhere classified: Secondary | ICD-10-CM

## 2021-06-24 DIAGNOSIS — L409 Psoriasis, unspecified: Secondary | ICD-10-CM | POA: Diagnosis not present

## 2021-06-24 DIAGNOSIS — Z8679 Personal history of other diseases of the circulatory system: Secondary | ICD-10-CM

## 2021-06-24 DIAGNOSIS — M5030 Other cervical disc degeneration, unspecified cervical region: Secondary | ICD-10-CM

## 2021-06-24 DIAGNOSIS — L4050 Arthropathic psoriasis, unspecified: Secondary | ICD-10-CM | POA: Diagnosis not present

## 2021-06-24 DIAGNOSIS — M5412 Radiculopathy, cervical region: Secondary | ICD-10-CM

## 2021-06-24 DIAGNOSIS — Z8669 Personal history of other diseases of the nervous system and sense organs: Secondary | ICD-10-CM

## 2021-06-24 DIAGNOSIS — M7122 Synovial cyst of popliteal space [Baker], left knee: Secondary | ICD-10-CM

## 2021-06-24 DIAGNOSIS — M170 Bilateral primary osteoarthritis of knee: Secondary | ICD-10-CM

## 2021-06-24 DIAGNOSIS — M25512 Pain in left shoulder: Secondary | ICD-10-CM

## 2021-06-24 NOTE — Patient Instructions (Signed)
Standing Labs ?We placed an order today for your standing lab work.  ? ?Please have your standing labs drawn in June and every 3 months  ? ?If possible, please have your labs drawn 2 weeks prior to your appointment so that the provider can discuss your results at your appointment. ? ?Please note that you may see your imaging and lab results in MyChart before we have reviewed them. ?We may be awaiting multiple results to interpret others before contacting you. ?Please allow our office up to 72 hours to thoroughly review all of the results before contacting the office for clarification of your results. ? ?We have open lab daily: ?Monday through Thursday from 1:30-4:30 PM and Friday from 1:30-4:00 PM ?at the office of Dr. Shaili Deveshwar, [facility].   ?Please be advised, all patients with office appointments requiring lab work will take precedent over walk-in lab work.  ?If possible, please come for your lab work on Monday and Friday afternoons, as you may experience shorter wait times. ?The office is located at [address], [address], [place], [place] [address] ?No appointment is necessary.   ?Labs are drawn by [facility]. Please bring your co-pay at the time of your lab draw.  You may receive a bill from [facility] for your lab work. ? ?Please note if you are on Hydroxychloroquine and and an order has been placed for a Hydroxychloroquine level, you will need to have it drawn 4 hours or more after your last dose. ? ?If you wish to have your labs drawn at another location, please call the office 24 hours in advance to send orders. ? ?If you have any questions regarding directions or hours of operation,  ?please call [phone].   ?As a reminder, please drink plenty of water prior to coming for your lab work. Thanks! ? ?

## 2021-06-24 NOTE — Telephone Encounter (Signed)
Next Visit: 06/24/2021 ?  ?Last Visit: 03/25/2021 ?  ?Last Fill: Allopurinol 03/26/2021 ?  ?DX: Idiopathic chronic gout of multiple sites without tophus  ?  ?Current Dose per office note 03/25/2021: allopurinol 300 mg 1 tablet daily ?  ?Labs: 03/25/2021, CBC and CMP WNL.  Uric acid is slightly elevated-6.3. ?  ?Okay to refill Allopurinol? ?

## 2021-06-25 LAB — CBC WITH DIFFERENTIAL/PLATELET
Absolute Monocytes: 706 cells/uL (ref 200–950)
Basophils Absolute: 50 cells/uL (ref 0–200)
Basophils Relative: 0.6 %
Eosinophils Absolute: 257 cells/uL (ref 15–500)
Eosinophils Relative: 3.1 %
HCT: 50.1 % — ABNORMAL HIGH (ref 38.5–50.0)
Hemoglobin: 17 g/dL (ref 13.2–17.1)
Lymphs Abs: 2963 cells/uL (ref 850–3900)
MCH: 32.1 pg (ref 27.0–33.0)
MCHC: 33.9 g/dL (ref 32.0–36.0)
MCV: 94.7 fL (ref 80.0–100.0)
MPV: 9.5 fL (ref 7.5–12.5)
Monocytes Relative: 8.5 %
Neutro Abs: 4324 cells/uL (ref 1500–7800)
Neutrophils Relative %: 52.1 %
Platelets: 239 10*3/uL (ref 140–400)
RBC: 5.29 10*6/uL (ref 4.20–5.80)
RDW: 12.7 % (ref 11.0–15.0)
Total Lymphocyte: 35.7 %
WBC: 8.3 10*3/uL (ref 3.8–10.8)

## 2021-06-25 LAB — COMPLETE METABOLIC PANEL WITH GFR
AG Ratio: 1.8 (calc) (ref 1.0–2.5)
ALT: 34 U/L (ref 9–46)
AST: 25 U/L (ref 10–40)
Albumin: 4.8 g/dL (ref 3.6–5.1)
Alkaline phosphatase (APISO): 71 U/L (ref 36–130)
BUN: 19 mg/dL (ref 7–25)
CO2: 25 mmol/L (ref 20–32)
Calcium: 9.4 mg/dL (ref 8.6–10.3)
Chloride: 101 mmol/L (ref 98–110)
Creat: 1.03 mg/dL (ref 0.60–1.26)
Globulin: 2.6 g/dL (calc) (ref 1.9–3.7)
Glucose, Bld: 84 mg/dL (ref 65–99)
Potassium: 4.8 mmol/L (ref 3.5–5.3)
Sodium: 139 mmol/L (ref 135–146)
Total Bilirubin: 0.7 mg/dL (ref 0.2–1.2)
Total Protein: 7.4 g/dL (ref 6.1–8.1)
eGFR: 95 mL/min/{1.73_m2} (ref >=60)

## 2021-06-25 NOTE — Progress Notes (Signed)
CBC and CMP WNL

## 2021-07-02 ENCOUNTER — Other Ambulatory Visit: Payer: Self-pay

## 2021-07-02 DIAGNOSIS — L409 Psoriasis, unspecified: Secondary | ICD-10-CM

## 2021-07-02 DIAGNOSIS — L4050 Arthropathic psoriasis, unspecified: Secondary | ICD-10-CM

## 2021-07-02 DIAGNOSIS — Z79899 Other long term (current) drug therapy: Secondary | ICD-10-CM

## 2021-07-02 MED ORDER — TALTZ 80 MG/ML ~~LOC~~ SOAJ: SUBCUTANEOUS | 2 refills | Status: DC

## 2021-07-02 NOTE — Telephone Encounter (Signed)
Junious Dresser from [facility] left a voicemail stating patient is out of refills for [employer].  Please fax prescription to #[phone]   ?Phone #[phone] ?

## 2021-07-02 NOTE — Telephone Encounter (Signed)
Next Visit: 09/25/2021 ? ?Last Visit: 06/24/2021 ? ?Last Fill: 04/09/2021 ? ?DX: Psoriatic arthritis  ? ?Current Dose per office note 06/24/2021: Taltz 80 mg sq injections every month ? ?Labs: 06/24/2021 CBC and CMP WNL ? ?TB Gold: 03/25/2021 Neg   ? ?Okay to refill Altamease Oiler?  ?

## 2021-07-04 DIAGNOSIS — R5383 Other fatigue: Secondary | ICD-10-CM | POA: Diagnosis not present

## 2021-09-05 ENCOUNTER — Encounter: Payer: Self-pay | Admitting: Rheumatology

## 2021-09-06 ENCOUNTER — Telehealth: Payer: Self-pay | Admitting: Pharmacist

## 2021-09-06 DIAGNOSIS — L409 Psoriasis, unspecified: Secondary | ICD-10-CM

## 2021-09-06 DIAGNOSIS — Z79899 Other long term (current) drug therapy: Secondary | ICD-10-CM

## 2021-09-06 DIAGNOSIS — L4050 Arthropathic psoriasis, unspecified: Secondary | ICD-10-CM

## 2021-09-06 NOTE — Telephone Encounter (Signed)
Medication Samples have been provided to the patient.  Drug name: Altamease Oiler        Strength: 80 mg        Qty: 1  LOT: [id number]  Exp.Date: 12/12/2022  Dosing instructions: Inject one pen into skin every 28 days.

## 2021-09-06 NOTE — Telephone Encounter (Addendum)
Attempted to run authorization for Altamease Oiler through [employer]. Patient's insurance may not be active. Sent MyChart message to patient to determine active date  Per rep, patient's coverage is inactive. Patient will have to call member services to troubleshoot. He is under a different member ID, different address, and different provider which has active date through 2099.  MyChart message sent to patient  Chesley Mires, PharmD, MPH, BCPS, CPP Clinical Pharmacist (Rheumatology and Pulmonology)

## 2021-09-10 ENCOUNTER — Other Ambulatory Visit (HOSPITAL_COMMUNITY): Payer: Self-pay

## 2021-09-12 NOTE — Progress Notes (Deleted)
Office Visit Note  Patient: Miguel Evans             Date of Birth: 1980-07-20           MRN: [id number]             PCP: Nonnie Done., MD Referring: Nonnie Done., MD Visit Date: 09/25/2021 Occupation: @GUAROCC @  Subjective:  No chief complaint on file.   History of Present Illness: Miguel Evans is a 41 y.o. male ***   Activities of Daily Living:  Patient reports morning stiffness for *** {minute/hour:19697}.   Patient {ACTIONS;DENIES/REPORTS:21021675::"Denies"} nocturnal pain.  Difficulty dressing/grooming: {ACTIONS;DENIES/REPORTS:21021675::"Denies"} Difficulty climbing stairs: {ACTIONS;DENIES/REPORTS:21021675::"Denies"} Difficulty getting out of chair: {ACTIONS;DENIES/REPORTS:21021675::"Denies"} Difficulty using hands for taps, buttons, cutlery, and/or writing: {ACTIONS;DENIES/REPORTS:21021675::"Denies"}  No Rheumatology ROS completed.   PMFS History:  Patient Active Problem List   Diagnosis Date Noted   Cervical disc disorder with radiculopathy 02/22/2019   Myofascial pain syndrome 02/22/2019   Tear of right rotator cuff 09/23/2018   Psoriasis 03/27/2017   Polyarthritis 02/24/2017   Patellofemoral arthralgia of both knees 02/24/2017   Hyperuricemia 03/26/2016   Elevated blood pressure  03/26/2016   Elevated LFTs 03/26/2016   Anxiety 03/26/2016   Psoriatic arthritis ([facility]) 03/25/2016   High risk medication use 03/25/2016   Cervicalgia 03/25/2016   Hand pain 03/25/2016   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    Past Medical History:  Diagnosis Date   Anxiety    Arthritis    Gout    Hypertension    Psoriasis    Psoriatic arthritis ([facility])    Sleep apnea     Family History  Problem Relation Age of Onset   Cancer Mother        breast    Bipolar disorder Mother    Hypertension Father    Osteoarthritis Father    Emphysema Maternal Grandfather    CAD Paternal Grandfather    Healthy Son    Healthy Son    Healthy Daughter    Healthy Daughter    Past  Surgical History:  Procedure Laterality Date   ANTERIOR CRUCIATE LIGAMENT REPAIR Bilateral Left in 2002, Right 2013   ANTERIOR CRUCIATE LIGAMENT REPAIR Bilateral 2014   ROTATOR CUFF REPAIR Left 06/05/2020   SHOULDER ARTHROSCOPY WITH SUBACROMIAL DECOMPRESSION AND OPEN ROTATOR C Right 09/23/2018   Procedure: SHOULDER ARTHROSCOPY WITH SUBACROMIAL DECOMPRESSION AND OPEN ROTATOR CUFF REPAIR,;  Surgeon: 11/23/2018, MD;  Location: [facility];  Service: Orthopedics;  Laterality: Right;  WITH BLOCK   VASECTOMY     Social History   Social History Narrative   Right handed   4 children   One story home    There is no immunization history on file for this patient.   Objective: Vital Signs: There were no vitals taken for this visit.   Physical Exam   Musculoskeletal Exam: ***  CDAI Exam: CDAI Score: -- Patient Global: --; Provider Global: -- Swollen: --; Tender: -- Joint Exam 09/25/2021   No joint exam has been documented for this visit   There is currently no information documented on the homunculus. Go to the Rheumatology activity and complete the homunculus joint exam.  Investigation: No additional findings.  Imaging: No results found.  Recent Labs: Lab Results  Component Value Date   WBC 8.3 06/24/2021   HGB 17.0 06/24/2021   PLT 239 06/24/2021   NA 139 06/24/2021   K 4.8 06/24/2021   CL 101 06/24/2021   CO2 25 06/24/2021  GLUCOSE 84 06/24/2021   BUN 19 06/24/2021   CREATININE 1.03 06/24/2021   BILITOT 0.7 06/24/2021   ALKPHOS 74 10/24/2016   AST 25 06/24/2021   ALT 34 06/24/2021   PROT 7.4 06/24/2021   ALBUMIN 4.7 10/24/2016   CALCIUM 9.4 06/24/2021   GFRAA 103 08/31/2020   QFTBGOLDPLUS NEGATIVE 03/25/2021    Speciality Comments: No specialty comments available.  Procedures:  No procedures performed Allergies: Patient has no known allergies.   Assessment / Plan:     Visit Diagnoses: No diagnosis found.  Orders: No orders of the defined types were  placed in this encounter.  No orders of the defined types were placed in this encounter.   Face-to-face time spent with patient was *** minutes. Greater than 50% of time was spent in counseling and coordination of care.  Follow-Up Instructions: No follow-ups on file.   Ellen Henri, CMA  Note - This record has been created using Animal nutritionist.  Chart creation errors have been sought, but may not always  have been located. Such creation errors do not reflect on  the standard of medical care.

## 2021-09-13 ENCOUNTER — Other Ambulatory Visit (HOSPITAL_COMMUNITY): Payer: Self-pay

## 2021-09-16 ENCOUNTER — Other Ambulatory Visit (HOSPITAL_COMMUNITY): Payer: Self-pay

## 2021-09-18 ENCOUNTER — Other Ambulatory Visit (HOSPITAL_COMMUNITY): Payer: Self-pay

## 2021-09-18 NOTE — Telephone Encounter (Signed)
Attempted to run prior authorization again but response from [facility] states eligiblity has termed. Will send another MyChart message to patient for phone number  Chesley Mires, PharmD, MPH, BCPS, CPP Clinical Pharmacist (Rheumatology and Pulmonology)

## 2021-09-20 NOTE — Telephone Encounter (Signed)
Called [employer] provider serivces to initiate prior authorization for Taltz over phone since he is not populating in [facility].  Phone: [phone], option 2, option 3  Knox Saliva, PharmD, MPH, BCPS, CPP Clinical Pharmacist (Rheumatology and Pulmonology)

## 2021-09-24 ENCOUNTER — Ambulatory Visit (INDEPENDENT_AMBULATORY_CARE_PROVIDER_SITE_OTHER): Payer: BC Managed Care – PPO | Admitting: Rheumatology

## 2021-09-24 ENCOUNTER — Encounter: Payer: Self-pay | Admitting: Rheumatology

## 2021-09-24 VITALS — BP 144/97 | HR 81 | Resp 15 | Ht 75.0 in | Wt 326.0 lb

## 2021-09-24 DIAGNOSIS — M5030 Other cervical disc degeneration, unspecified cervical region: Secondary | ICD-10-CM

## 2021-09-24 DIAGNOSIS — Z8669 Personal history of other diseases of the nervous system and sense organs: Secondary | ICD-10-CM

## 2021-09-24 DIAGNOSIS — M1A09X0 Idiopathic chronic gout, multiple sites, without tophus (tophi): Secondary | ICD-10-CM | POA: Diagnosis not present

## 2021-09-24 DIAGNOSIS — Z79899 Other long term (current) drug therapy: Secondary | ICD-10-CM | POA: Diagnosis not present

## 2021-09-24 DIAGNOSIS — Z9889 Other specified postprocedural states: Secondary | ICD-10-CM | POA: Diagnosis not present

## 2021-09-24 DIAGNOSIS — M170 Bilateral primary osteoarthritis of knee: Secondary | ICD-10-CM

## 2021-09-24 DIAGNOSIS — Z8679 Personal history of other diseases of the circulatory system: Secondary | ICD-10-CM

## 2021-09-24 DIAGNOSIS — L409 Psoriasis, unspecified: Secondary | ICD-10-CM

## 2021-09-24 DIAGNOSIS — L4050 Arthropathic psoriasis, unspecified: Secondary | ICD-10-CM | POA: Diagnosis not present

## 2021-09-24 DIAGNOSIS — M25512 Pain in left shoulder: Secondary | ICD-10-CM

## 2021-09-24 DIAGNOSIS — M533 Sacrococcygeal disorders, not elsewhere classified: Secondary | ICD-10-CM

## 2021-09-24 DIAGNOSIS — M797 Fibromyalgia: Secondary | ICD-10-CM

## 2021-09-24 DIAGNOSIS — G8929 Other chronic pain: Secondary | ICD-10-CM

## 2021-09-24 NOTE — Progress Notes (Signed)
Office Visit Note  Patient: Miguel Evans             Date of Birth: 03/21/1981           MRN: [id number]             PCP: Nonnie Done., MD Referring: Nonnie Done., MD Visit Date: 09/24/2021 Occupation: @GUAROCC @  Subjective:  Pain in all the joints and muscles  History of Present Illness: Miguel Evans is a 41 y.o. male with history of psoriatic arthritis.  He states he has been on 24 since October 2020.  He believes the November 2020 is not working anymore.  He has been having pain in all of his joints in his muscles.  He has not noticed any joint swelling.  He also experiences increased fatigue and insomnia due to nocturnal pain.  He denies any history of Planter fasciitis, Achilles tendinitis or uveitis.  He denies any gout flare.  He states that he was laid off from his job due to ongoing pain and discomfort and is currently not working.  Activities of Daily Living:  Patient reports morning stiffness for 1 hour.   Patient Reports nocturnal pain.  Difficulty dressing/grooming: Reports Difficulty climbing stairs: Reports Difficulty getting out of chair: Reports Difficulty using hands for taps, buttons, cutlery, and/or writing: Reports  Review of Systems  Constitutional:  Positive for fatigue.  HENT:  Negative for mouth dryness.   Eyes:  Negative for dryness.  Respiratory:  Negative for shortness of breath.   Cardiovascular:  Positive for swelling in legs/feet.  Gastrointestinal:  Negative for constipation.  Endocrine: Positive for heat intolerance.  Genitourinary:  Negative for difficulty urinating.  Musculoskeletal:  Positive for joint pain, gait problem, joint pain, morning stiffness and muscle tenderness. Negative for joint swelling.  Skin:  Negative for rash.  Allergic/Immunologic: Negative for susceptible to infections.  Neurological:  Positive for numbness.  Hematological:  Positive for bruising/bleeding tendency.  Psychiatric/Behavioral:  Positive for sleep disturbance.      PMFS History:  Patient Active Problem List   Diagnosis Date Noted   Cervical disc disorder with radiculopathy 02/22/2019   Myofascial pain syndrome 02/22/2019   Tear of right rotator cuff 09/23/2018   Psoriasis 03/27/2017   Polyarthritis 02/24/2017   Patellofemoral arthralgia of both knees 02/24/2017   Hyperuricemia 03/26/2016   Elevated blood pressure  03/26/2016   Elevated LFTs 03/26/2016   Anxiety 03/26/2016   Psoriatic arthritis (HCC) 03/25/2016   High risk medication use 03/25/2016   Cervicalgia 03/25/2016   Hand pain 03/25/2016   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    Past Medical History:  Diagnosis Date   Anxiety    Arthritis    Gout    Hypertension    Psoriasis    Psoriatic arthritis (HCC)    Sleep apnea     Family History  Problem Relation Age of Onset   Cancer Mother        breast    Bipolar disorder Mother    Hypertension Father    Osteoarthritis Father    Emphysema Maternal Grandfather    CAD Paternal Grandfather    Healthy Son    Healthy Son    Healthy Daughter    Healthy Daughter    Past Surgical History:  Procedure Laterality Date   ANTERIOR CRUCIATE LIGAMENT REPAIR Bilateral Left in 2002, Right 2013   ANTERIOR CRUCIATE LIGAMENT REPAIR Bilateral 2014   ROTATOR CUFF REPAIR Left 06/05/2020   SHOULDER ARTHROSCOPY WITH SUBACROMIAL DECOMPRESSION  AND OPEN ROTATOR C Right 09/23/2018   Procedure: SHOULDER ARTHROSCOPY WITH SUBACROMIAL DECOMPRESSION AND OPEN ROTATOR CUFF REPAIR,;  Surgeon: Valeria BatmanWhitfield, Peter W, MD;  Location: [facility];  Service: Orthopedics;  Laterality: Right;  WITH BLOCK   VASECTOMY     Social History   Social History Narrative   Right handed   4 children   One story home    There is no immunization history on file for this patient.   Objective: Vital Signs: BP (!) 144/97 (BP Location: Left Arm, Patient Position: Sitting, Cuff Size: Normal)   Pulse 81   Resp 15   Ht 6\' 3"  (1.905 m)   Wt (!) 326 lb (147.9 kg)   BMI 40.75  kg/m    Physical Exam Vitals and nursing note reviewed.  Constitutional:      Appearance: He is well-developed.  HENT:     Head: Normocephalic and atraumatic.  Eyes:     Conjunctiva/sclera: Conjunctivae normal.     Pupils: Pupils are equal, round, and reactive to light.  Cardiovascular:     Rate and Rhythm: Normal rate and regular rhythm.     Heart sounds: Normal heart sounds.  Pulmonary:     Effort: Pulmonary effort is normal.     Breath sounds: Normal breath sounds.  Abdominal:     General: Bowel sounds are normal.     Palpations: Abdomen is soft.  Musculoskeletal:     Cervical back: Normal range of motion and neck supple.  Skin:    General: Skin is warm and dry.     Capillary Refill: Capillary refill takes less than 2 seconds.  Neurological:     Mental Status: He is alert and oriented to person, place, and time.  Psychiatric:        Behavior: Behavior normal.      Musculoskeletal Exam: C-spine thoracic and lumbar spine were in good range of motion.  He had discomfort range of motion of bilateral shoulder joints.  He had tenderness over bilateral lateral epicondyle.  Wrist joints, MCPs PIPs and DIPs with good range of motion with no synovitis.  Hip joints, knee joints, ankles, MTPs and PIPs with good range of motion with no synovitis.  There was no evidence of Planter fasciitis or Achilles tendinitis.  He had generalized hyperalgesia and positive tender points.  CDAI Exam: CDAI Score: -- Patient Global: --; Provider Global: -- Swollen: --; Tender: -- Joint Exam 09/24/2021   No joint exam has been documented for this visit   There is currently no information documented on the homunculus. Go to the Rheumatology activity and complete the homunculus joint exam.  Investigation: No additional findings.  Imaging: No results found.  Recent Labs: Lab Results  Component Value Date   WBC 8.3 06/24/2021   HGB 17.0 06/24/2021   PLT 239 06/24/2021   NA 139 06/24/2021   K  4.8 06/24/2021   CL 101 06/24/2021   CO2 25 06/24/2021   GLUCOSE 84 06/24/2021   BUN 19 06/24/2021   CREATININE 1.03 06/24/2021   BILITOT 0.7 06/24/2021   ALKPHOS 74 10/24/2016   AST 25 06/24/2021   ALT 34 06/24/2021   PROT 7.4 06/24/2021   ALBUMIN 4.7 10/24/2016   CALCIUM 9.4 06/24/2021   GFRAA 103 08/31/2020   QFTBGOLDPLUS NEGATIVE 03/25/2021    Speciality Comments: Humira, Enbrel12/17-06/18, Cosyntex 06/18-09/20,Taltz 10/20 MTX -high LFTs  Procedures:  No procedures performed Allergies: Patient has no known allergies.   Assessment / Plan:     Visit  Diagnoses: Psoriatic arthritis (HCC) -patient had no synovitis or tenosynovitis on examination.  He complains of pain and discomfort in all of his joints and muscles.  There is no history of joint swelling.  He denies any episodes of Planter fasciitis, Achilles tendinitis or uveitis.  He had generalized pain and hyperalgesia.  Plan: Sedimentation rate  Psoriasis-he had no active psoriasis lesions.  High risk medication use -he is on Taltz 80 mg subcu every 28 days.  Labs obtained on June 24, 2021 showed CBC and CMP were normal.  He had elevated LFTs in the past on methotrexate.  Will obtain labs today and then every 3 months to monitor for drug toxicity.  TB gold was negative on March 25, 2021.  We will check TB Gold annually.  Plan: CBC with Differential/Platelet, COMPLETE METABOLIC PANEL WITH GFR  S/P right rotator cuff repair-he continues to have some discomfort in his shoulder joints.  Chronic left shoulder pain-followed by orthopedics.  Primary osteoarthritis of both knees-he has osteoarthritis in his knee joints.  Last x-rays were in 2018 which showed moderate osteoarthritis and moderate chondromalacia patella.  Weight loss diet and exercise was emphasized.  Chronic left SI joint pain-improved after being on immunosuppressive agents.  DDD (degenerative disc disease), cervical-he continues to have some neck  stiffness.  Idiopathic chronic gout of multiple sites without tophus -he denies gout flare.  He has been taking allopurinol 300 mg p.o. daily.  He takes colchicine only on as needed basis.  Uric acid obtained on March 25, 2021 was 6.3.  We will check uric acid level again today.  Plan: Uric acid  Fibromyalgia-he gives history of generalized pain and hyperalgesia.  He had multiple tender points.  He is on Cymbalta 60 mg a day.  I believe most of his pain is coming from fibromyalgia.  He also gives history of nocturnal pain and insomnia.  He will benefit from pain management.  Patient states his PCP will refer him to pain management locally.  Need for stretching, water aerobics and swimming was emphasized.  Good sleep hygiene was also discussed.  History of hypertension-his blood pressure was elevated today.  He was advised to monitor blood pressure closely.  History of sleep apnea  Orders: Orders Placed This Encounter  Procedures   CBC with Differential/Platelet   COMPLETE METABOLIC PANEL WITH GFR   Sedimentation rate   Uric acid   No orders of the defined types were placed in this encounter.    Follow-Up Instructions: Return in about 3 months (around 12/25/2021) for Psoriatic arthritis, Osteoarthritis.   Pollyann Savoy, MD  Note - This record has been created using Animal nutritionist.  Chart creation errors have been sought, but may not always  have been located. Such creation errors do not reflect on  the standard of medical care.

## 2021-09-24 NOTE — Patient Instructions (Signed)
Standing Labs We placed an order today for your standing lab work.   Please have your standing labs drawn in September and every 3 months  If possible, please have your labs drawn 2 weeks prior to your appointment so that the provider can discuss your results at your appointment.  Please note that you may see your imaging and lab results in MyChart before we have reviewed them. We may be awaiting multiple results to interpret others before contacting you. Please allow our office up to 72 hours to thoroughly review all of the results before contacting the office for clarification of your results.  We have open lab daily: Monday through Thursday from 1:30-4:30 PM and Friday from 1:30-4:00 PM at the office of Dr. Kristell Wooding, [facility].   Please be advised, all patients with office appointments requiring lab work will take precedent over walk-in lab work.  If possible, please come for your lab work on Monday and Friday afternoons, as you may experience shorter wait times. The office is located at [address], [address], [place], [place] [address] No appointment is necessary.   Labs are drawn by [facility]. Please bring your co-pay at the time of your lab draw.  You may receive a bill from [facility] for your lab work.  Please note if you are on Hydroxychloroquine and and an order has been placed for a Hydroxychloroquine level, you will need to have it drawn 4 hours or more after your last dose.  If you wish to have your labs drawn at another location, please call the office 24 hours in advance to send orders.  If you have any questions regarding directions or hours of operation,  please call [phone].   As a reminder, please drink plenty of water prior to coming for your lab work. Thanks!   Vaccines You are taking a medication(s) that can suppress your immune system.  The following immunizations are recommended: Flu annually Covid-19  Td/Tdap (tetanus, diphtheria,  pertussis) every 10 years Pneumonia (Prevnar 15 then Pneumovax 23 at least 1 year apart.  Alternatively, can take Prevnar 20 without needing additional dose) Shingrix: 2 doses from 4 weeks to 6 months apart  Please check with your PCP to make sure you are up to date.   If you have signs or symptoms of an infection or start antibiotics: First, call your PCP for workup of your infection. Hold your medication through the infection, until you complete your antibiotics, and until symptoms resolve if you take the following: Injectable medication (Actemra, Benlysta, Cimzia, Cosentyx, Enbrel, Humira, Kevzara, Orencia, Remicade, Simponi, Stelara, Taltz, Tremfya) Methotrexate Leflunomide (Arava) Mycophenolate (Cellcept) Xeljanz, Olumiant, or Rinvoq  

## 2021-09-25 ENCOUNTER — Ambulatory Visit: Payer: BC Managed Care – PPO | Admitting: Rheumatology

## 2021-09-25 DIAGNOSIS — L409 Psoriasis, unspecified: Secondary | ICD-10-CM

## 2021-09-25 DIAGNOSIS — Z8679 Personal history of other diseases of the circulatory system: Secondary | ICD-10-CM

## 2021-09-25 DIAGNOSIS — M797 Fibromyalgia: Secondary | ICD-10-CM

## 2021-09-25 DIAGNOSIS — M7122 Synovial cyst of popliteal space [Baker], left knee: Secondary | ICD-10-CM

## 2021-09-25 DIAGNOSIS — Z9889 Other specified postprocedural states: Secondary | ICD-10-CM

## 2021-09-25 DIAGNOSIS — Z79899 Other long term (current) drug therapy: Secondary | ICD-10-CM

## 2021-09-25 DIAGNOSIS — L4050 Arthropathic psoriasis, unspecified: Secondary | ICD-10-CM

## 2021-09-25 DIAGNOSIS — Z8669 Personal history of other diseases of the nervous system and sense organs: Secondary | ICD-10-CM

## 2021-09-25 DIAGNOSIS — M1A09X0 Idiopathic chronic gout, multiple sites, without tophus (tophi): Secondary | ICD-10-CM

## 2021-09-25 DIAGNOSIS — M5030 Other cervical disc degeneration, unspecified cervical region: Secondary | ICD-10-CM

## 2021-09-25 DIAGNOSIS — M5412 Radiculopathy, cervical region: Secondary | ICD-10-CM

## 2021-09-25 DIAGNOSIS — M170 Bilateral primary osteoarthritis of knee: Secondary | ICD-10-CM

## 2021-09-25 DIAGNOSIS — G8929 Other chronic pain: Secondary | ICD-10-CM

## 2021-09-25 LAB — COMPLETE METABOLIC PANEL WITH GFR
AG Ratio: 1.8 (calc) (ref 1.0–2.5)
ALT: 37 U/L (ref 9–46)
AST: 31 U/L (ref 10–40)
Albumin: 4.9 g/dL (ref 3.6–5.1)
Alkaline phosphatase (APISO): 76 U/L (ref 36–130)
BUN: 13 mg/dL (ref 7–25)
CO2: 28 mmol/L (ref 20–32)
Calcium: 9.9 mg/dL (ref 8.6–10.3)
Chloride: 103 mmol/L (ref 98–110)
Creat: 1.09 mg/dL (ref 0.60–1.26)
Globulin: 2.8 g/dL (calc) (ref 1.9–3.7)
Glucose, Bld: 99 mg/dL (ref 65–99)
Potassium: 4.7 mmol/L (ref 3.5–5.3)
Sodium: 140 mmol/L (ref 135–146)
Total Bilirubin: 1.3 mg/dL — ABNORMAL HIGH (ref 0.2–1.2)
Total Protein: 7.7 g/dL (ref 6.1–8.1)
eGFR: 89 mL/min/{1.73_m2} (ref >=60)

## 2021-09-25 LAB — CBC WITH DIFFERENTIAL/PLATELET
Absolute Monocytes: 582 cells/uL (ref 200–950)
Basophils Absolute: 32 cells/uL (ref 0–200)
Basophils Relative: 0.5 %
Eosinophils Absolute: 282 cells/uL (ref 15–500)
Eosinophils Relative: 4.4 %
HCT: 52.6 % — ABNORMAL HIGH (ref 38.5–50.0)
Hemoglobin: 17.7 g/dL — ABNORMAL HIGH (ref 13.2–17.1)
Lymphs Abs: 2426 cells/uL (ref 850–3900)
MCH: 32.2 pg (ref 27.0–33.0)
MCHC: 33.7 g/dL (ref 32.0–36.0)
MCV: 95.8 fL (ref 80.0–100.0)
MPV: 9.8 fL (ref 7.5–12.5)
Monocytes Relative: 9.1 %
Neutro Abs: 3078 cells/uL (ref 1500–7800)
Neutrophils Relative %: 48.1 %
Platelets: 218 10*3/uL (ref 140–400)
RBC: 5.49 10*6/uL (ref 4.20–5.80)
RDW: 13.3 % (ref 11.0–15.0)
Total Lymphocyte: 37.9 %
WBC: 6.4 10*3/uL (ref 3.8–10.8)

## 2021-09-25 LAB — URIC ACID: Uric Acid, Serum: 7.3 mg/dL (ref 4.0–8.0)

## 2021-09-25 LAB — SEDIMENTATION RATE: Sed Rate: 2 mm/h (ref 0–15)

## 2021-09-25 NOTE — Progress Notes (Signed)
Uric acid is elevated at 7.3.  Sed rate is normal.  CBC and CMP are normal.  Please advise patient to take allopurinol on a regular basis.  Labs do not indicate inflammation.

## 2021-09-29 ENCOUNTER — Other Ambulatory Visit: Payer: Self-pay | Admitting: Physician Assistant

## 2021-09-29 DIAGNOSIS — M1A09X0 Idiopathic chronic gout, multiple sites, without tophus (tophi): Secondary | ICD-10-CM

## 2021-09-30 ENCOUNTER — Other Ambulatory Visit (HOSPITAL_COMMUNITY): Payer: Self-pay

## 2021-09-30 MED ORDER — TALTZ 80 MG/ML ~~LOC~~ SOAJ: SUBCUTANEOUS | 2 refills | Status: DC

## 2021-09-30 NOTE — Telephone Encounter (Signed)
Called patient to determine if he called insurance for benefits. He confirmed he called last week and his insurance is active. Patient continues to have no insurance populate in eligibility check. I manually entered insurance information. Per test claim, M/I date of birth and group ID  Called insurance to initiate prior authorization over telephone. Per [employer], member has not been active with that iD number since 2020.  [facility] specialty pharmacy: [phone]  I called local [facility] and they provided [employer] billing information that is active.  BIN: [id number] Group: [id number] PCN: [id number] ID: [id number]  Called [facility] and they triaged me to [employer] who states that plan is not active. I advised that I was already aware and need to see if it will process through at all. Called [facility] back and provided insurance informat. They are now out of network. He will have to fill with [facility]. My Chart message sent to patient with pharmacy information.  Chesley Mires, PharmD, MPH, BCPS, CPP Clinical Pharmacist (Rheumatology and Pulmonology)

## 2021-10-01 NOTE — Telephone Encounter (Signed)
Next Visit: 12/25/2021  Last Visit: 09/24/2021  Last Fill: 06/24/2021  DX: Idiopathic chronic gout of multiple sites without tophus   Current Dose per office note 09/24/2021: allopurinol 300 mg p.o. daily  Labs: 09/24/2021, Uric acid is elevated at 7.3.  Sed rate is normal.  CBC and CMP are normal.  Please advise patient to take allopurinol on a regular basis.  Labs do not indicate inflammation.  Okay to refill Allopurinol?

## 2021-10-31 ENCOUNTER — Other Ambulatory Visit (HOSPITAL_COMMUNITY): Payer: Self-pay

## 2021-11-13 ENCOUNTER — Encounter: Payer: Self-pay | Admitting: Rheumatology

## 2021-11-13 DIAGNOSIS — Z79899 Other long term (current) drug therapy: Secondary | ICD-10-CM

## 2021-11-13 DIAGNOSIS — L4050 Arthropathic psoriasis, unspecified: Secondary | ICD-10-CM

## 2021-11-13 DIAGNOSIS — L409 Psoriasis, unspecified: Secondary | ICD-10-CM

## 2021-11-14 NOTE — Telephone Encounter (Signed)
Called [facility] regarding patient's Taltz rx.  They did not have correct insurance information and patient again has provided incorrect insurance information (for [facility])>  They will re-open his case, but they have to send his insurance information to [employer] for verification. Rep has marked as urgent and states may have 24-48 hours turnaround time. She states she is unable to run a test claim at this time to see if it will adjudicate at minimum. Will have to follow-up with pharmacy  Chesley Mires, PharmD, MPH, BCPS, CPP Clinical Pharmacist (Rheumatology and Pulmonology)

## 2021-11-15 NOTE — Telephone Encounter (Signed)
Called [facility] regarding Taltz rx. Rep will again escalate to supervisor for insurance verification. Unable to run test claim. She states she will personally call me if rx processes today.  [employer] team phone: [phone]  Chesley Mires, PharmD, MPH, BCPS, CPP Clinical Pharmacist (Rheumatology and Pulmonology)

## 2021-11-18 ENCOUNTER — Other Ambulatory Visit (HOSPITAL_COMMUNITY): Payer: Self-pay

## 2021-11-18 NOTE — Telephone Encounter (Signed)
Received callback from [facility] stating they are not in network with pt's plan and will not be able to fill. Did a check a benefit check with insurance and copay card information and received a paid claim for pt's insurance, but Max Benefit Allowed rejection with [employer] co-pay card. Called pt to inform of both things. Pt reports prescription should be sent to [facility] pharmacy to fill with co-pay card. Pt denied number to call and request extended benefits with Taltz copay card. Pt asks to send prescription to [facility] pharmacy.

## 2021-11-19 MED ORDER — TALTZ 80 MG/ML ~~LOC~~ SOAJ: SUBCUTANEOUS | 2 refills | Status: DC

## 2021-11-19 NOTE — Telephone Encounter (Signed)
Please send refill of taltz for the patient.

## 2021-11-26 ENCOUNTER — Telehealth: Payer: Self-pay | Admitting: Rheumatology

## 2021-11-26 NOTE — Telephone Encounter (Signed)
[facility] left a message requesting clarification for the prescription for Taltz that they received for the patient.  Phone# [phone]

## 2021-11-26 NOTE — Telephone Encounter (Signed)
Spoke with Jasmine at [facility] and clarified patient has completed loading doses and only switched to their pharmacy due to insurance. She has noted patient's chart, verfied allergies and prescription.

## 2021-11-28 ENCOUNTER — Telehealth: Payer: Self-pay | Admitting: Pharmacist

## 2021-11-28 NOTE — Telephone Encounter (Signed)
Faxed prior authorization form to [facility] for [employer]  Fax: [phone] Case ID [id number]  Chesley Mires, PharmD, MPH, BCPS, CPP Clinical Pharmacist (Rheumatology and Pulmonology)

## 2021-11-29 NOTE — Telephone Encounter (Signed)
Received notification from [facility] regarding a prior authorization for TALTZ. Authorization has been APPROVED from 10/29/21 to 11/28/22.   Patient must fill through [facility]: [phone]  Authorization # [id number]  Chesley Mires, PharmD, MPH, BCPS, CPP Clinical Pharmacist (Rheumatology and Pulmonology)

## 2021-12-11 NOTE — Progress Notes (Unsigned)
Office Visit Note  Patient: Miguel Evans             Date of Birth: 1980/06/20           MRN: [id number]             PCP: Enid Skeens., MD Referring: Enid Skeens., MD Visit Date: 12/25/2021 Occupation: @GUAROCC @  Subjective:  Chronic pain   History of Present Illness: Miguel Evans is a 41 y.o. male with history of cirrhotic arthritis, osteoarthritis, gout, fibromyalgia, and DDD.  Patient had a recent In therapy for 1 month due to changes with insurance.  He resumed Taltz at the end of August 2023.  He states that the pain and swelling he noticed in his hands while off of Donnetta Hail has resolved since resuming Taltz as prescribed.  He continues to have chronic pain in the left shoulder, right hip, left knee, and left Achilles tendon.  He denies any joint swelling at this time.  He attributes most of his discomfort due to previous joint damage as well as fibromyalgia.  He continues to have interrupted sleep at night and difficulty throughout the day especially with overuse activities.  He has been trying to go to the gym 3 to 4 days/week for about 30 minutes.  He denies any psoriasis at this time.  He denies any recent gout flares.  He remains on allopurinol 300 mg daily and colchicine as needed during flares.  He has been taking Aleve on a daily basis for pain relief. He denies any recent or recurrent infections.    Activities of Daily Living:  Patient reports morning stiffness for 1-1.5 hours.   Patient Reports nocturnal pain.  Difficulty dressing/grooming: Reports Difficulty climbing stairs: Reports Difficulty getting out of chair: Reports Difficulty using hands for taps, buttons, cutlery, and/or writing: Reports  Review of Systems  Constitutional:  Positive for fatigue.  HENT:  Negative for mouth sores and mouth dryness.   Eyes:  Negative for dryness.  Respiratory:  Positive for shortness of breath.   Cardiovascular:  Positive for palpitations. Negative for chest pain.   Gastrointestinal:  Negative for blood in stool, constipation and diarrhea.  Endocrine: Negative for increased urination.  Genitourinary:  Negative for involuntary urination.  Musculoskeletal:  Positive for joint pain, joint pain, myalgias, morning stiffness, muscle tenderness and myalgias. Negative for gait problem, joint swelling and muscle weakness.  Skin:  Negative for color change, rash, hair loss and sensitivity to sunlight.  Allergic/Immunologic: Negative for susceptible to infections.  Neurological:  Positive for headaches. Negative for dizziness.  Hematological:  Negative for swollen glands.  Psychiatric/Behavioral:  Positive for depressed mood and sleep disturbance. The patient is nervous/anxious.     PMFS History:  Patient Active Problem List   Diagnosis Date Noted   Cervical disc disorder with radiculopathy 02/22/2019   Myofascial pain syndrome 02/22/2019   Tear of right rotator cuff 09/23/2018   Psoriasis 03/27/2017   Polyarthritis 02/24/2017   Patellofemoral arthralgia of both knees 02/24/2017   Hyperuricemia 03/26/2016   Elevated blood pressure  03/26/2016   Elevated LFTs 03/26/2016   Anxiety 03/26/2016   Psoriatic arthritis ([place]) 03/25/2016   High risk medication use 03/25/2016   Cervicalgia 03/25/2016   Hand pain 03/25/2016   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    Past Medical History:  Diagnosis Date   Anxiety    Arthritis    Gout    Hypertension    Psoriasis    Psoriatic arthritis ([place])  Sleep apnea     Family History  Problem Relation Age of Onset   Cancer Mother        breast    Bipolar disorder Mother    Hypertension Father    Osteoarthritis Father    Emphysema Maternal Grandfather    CAD Paternal Grandfather    Healthy Son    Healthy Son    Healthy Daughter    Healthy Daughter    Past Surgical History:  Procedure Laterality Date   ANTERIOR CRUCIATE LIGAMENT REPAIR Bilateral Left in 2002, Right 2013   ANTERIOR CRUCIATE LIGAMENT  REPAIR Bilateral 2014   ROTATOR CUFF REPAIR Left 06/05/2020   SHOULDER ARTHROSCOPY WITH SUBACROMIAL DECOMPRESSION AND OPEN ROTATOR C Right 09/23/2018   Procedure: SHOULDER ARTHROSCOPY WITH SUBACROMIAL DECOMPRESSION AND OPEN ROTATOR CUFF REPAIR,;  Surgeon: Garald Balding, MD;  Location: [facility];  Service: Orthopedics;  Laterality: Right;  WITH BLOCK   VASECTOMY     Social History   Social History Narrative   Right handed   4 children   One story home    There is no immunization history on file for this patient.   Objective: Vital Signs: BP (!) 153/99 (BP Location: Right Arm, Patient Position: Sitting, Cuff Size: Large)   Pulse 73   Resp 18   Ht 6' 3"  (1.905 m)   Wt (!) 323 lb 6.4 oz (146.7 kg)   BMI 40.42 kg/m    Physical Exam Vitals and nursing note reviewed.  Constitutional:      Appearance: He is well-developed.  HENT:     Head: Normocephalic and atraumatic.  Eyes:     Conjunctiva/sclera: Conjunctivae normal.     Pupils: Pupils are equal, round, and reactive to light.  Cardiovascular:     Rate and Rhythm: Normal rate and regular rhythm.     Heart sounds: Normal heart sounds.  Pulmonary:     Effort: Pulmonary effort is normal.     Breath sounds: Normal breath sounds.  Abdominal:     General: Bowel sounds are normal.     Palpations: Abdomen is soft.  Musculoskeletal:     Cervical back: Normal range of motion and neck supple.  Skin:    General: Skin is warm and dry.     Capillary Refill: Capillary refill takes less than 2 seconds.  Neurological:     Mental Status: He is alert and oriented to person, place, and time.  Psychiatric:        Behavior: Behavior normal.      Musculoskeletal Exam: C-spine is limited and painful range of motion.  Trapezius muscle tension tenderness bilaterally.  Discomfort with range of motion of the left shoulder.  Elbow joints have good range of motion with no tenderness or inflammation.  Wrist joints, MCPs, PIPs, DIPs have good range  of motion with no synovitis.  Complete fist formation bilaterally.  Hip joints have good range of motion with no groin pain.  Tenderness over the right trochanteric bursa.  Knee joints have good range of motion with no warmth or effusion.  Baker's cyst palpable behind the left knee.  Ankle joints have good range of motion with no joint tenderness or synovitis.  Some tenderness along the Achilles tendon of the left foot.  No evidence of plantar fasciitis.  No tenderness over MTP joints.  CDAI Exam: CDAI Score: -- Patient Global: --; Provider Global: -- Swollen: --; Tender: -- Joint Exam 12/25/2021   No joint exam has been documented for this visit  There is currently no information documented on the homunculus. Go to the Rheumatology activity and complete the homunculus joint exam.  Investigation: No additional findings.  Imaging: No results found.  Recent Labs: Lab Results  Component Value Date   WBC 6.4 09/24/2021   HGB 17.7 (H) 09/24/2021   PLT 218 09/24/2021   NA 140 09/24/2021   K 4.7 09/24/2021   CL 103 09/24/2021   CO2 28 09/24/2021   GLUCOSE 99 09/24/2021   BUN 13 09/24/2021   CREATININE 1.09 09/24/2021   BILITOT 1.3 (H) 09/24/2021   ALKPHOS 74 10/24/2016   AST 31 09/24/2021   ALT 37 09/24/2021   PROT 7.7 09/24/2021   ALBUMIN 4.7 10/24/2016   CALCIUM 9.9 09/24/2021   GFRAA 103 08/31/2020   QFTBGOLDPLUS NEGATIVE 03/25/2021    Speciality Comments: Humira, Enbrel12/17-06/18, Cosyntex 06/18-09/20,Taltz 10/20 MTX -high LFTs  Procedures:  No procedures performed Allergies: Patient has no known allergies.     Assessment / Plan:     Visit Diagnoses: Psoriatic arthritis ([place]): ESR WNL 09/24/21. He has no synovitis or dactylitis on examination today.  He recently had a gap in therapy and missed 1 dose of Taltz due to change in insurance.  He resumed Taltz injections at the end of August 2023 which has alleviated the pain and swelling he was experiencing in his hands  and feet.  He continues to have chronic pain involving multiple joints which she attributes to previous joint damage and fibromyalgia.  His pain is most severe in the left shoulder, right hip, and left knee joint.  He continues to experience nocturnal pain and feels that his quality of life has been suffering due to his level of pain.  He has been taking Aleve on a daily basis for pain relief.  He has been trying to go to the gym for 30 minutes 3 to 4 days/week but notices increased joint pain and stiffness with overuse/strenuous activities.  Offered a referral to pain management but he declined at this time.  I also offered a referral to integrative therapies but he would like to hold off at this time. Overall his psoriatic arthritis seems to be well controlled on Taltz as monotherapy.  He will remain on Taltz as prescribed.  He was advised to notify us if he develops increased joint pain or joint swelling.  He will follow-up in the office in 3 months or sooner if needed.  Psoriasis: He has no active psoriasis at this time.  High risk medication use - Taltz 80 mg sq injections every 28 days.   Methotrexate-d/c due to elevated LFTs. - Plan: CBC with Differential/Platelet, COMPLETE METABOLIC PANEL WITH GFR, QuantiFERON-TB Gold Plus CBC and CMP updated on 09/24/2021.  Orders for CBC and CMP were released today.  His next lab work will be due in December and every 3 months to monitor for drug toxicity. TB Gold negative on 03/25/2021.  Future order for TB Gold placed today. He has not had any recent or recurrent infections.  Discussed the importance of holding Taltz if he develops signs or symptoms of an infection and to resume once the infection is completely cleared.  ESR within normal limits and uric acid 7.3 on 09/24/2021.  Screening for tuberculosis -Future order for TB Gold placed today.  Plan: QuantiFERON-TB Gold Plus  S/P right rotator cuff repair: Doing well.   Chronic left shoulder pain: Chronic  pain.  Discomfort at night.   Primary osteoarthritis of both knees: He has good ROM  of both knee joints. Bakers cyst palpable behind left knee.  He experiences significant pain and stiffness in both knee joints when rising from a seated position.  Offered referral to pain management as well as referral to integrative therapies but he declined at this time.  He plans on continuing to take Aleve on a daily basis for pain relief.  Chronic left SI joint pain: He has no SI joint tenderness upon palpation today.  Trochanteric bursitis, right hip: Patient presents today with increased discomfort in the right hip joint.  On examination he has good range of motion with no groin pain.  He has tenderness to palpation over the right trochanteric bursa.  Different treatment options were discussed today.  He declined referral to integrative therapies at this time.  He was given a handout of home exercises to perform.  If his blood pressure is well controlled in the future we can try a right trochanteric bursa cortisone injection in the future.   DDD (degenerative disc disease), cervical: Chronic pain and stiffness.  Idiopathic chronic gout of multiple sites without tophus - He has not had any signs or symptoms of a gout flare.  He has clinically been doing well taking allopurinol 300 mg 1 tablet by mouth daily and colchicine as needed during flares.  Uric acid: 7.3 on 09/24/2021.  Fibromyalgia - He has ongoing generalized myalgias and muscle tenderness due to fibromyalgia.  He remains on Cymbalta 60 mg daily and has been taking Aleve for pain relief.  Patient declined a referral to integrative therapies as well as pain management at this time.  Discussed the importance of regular exercise and good sleep hygiene.    Other medical conditions are listed as follows:  History of hypertension: Blood pressure was elevated today in the office 153/99.  Patient was advised to call his PCP to discuss adjusting his  antihypertensive medications.  If his blood pressure is better controlled he can return for a right trochanteric bursa cortisone injection in the future.  History of sleep apnea   Orders: Orders Placed This Encounter  Procedures   CBC with Differential/Platelet   COMPLETE METABOLIC PANEL WITH GFR   QuantiFERON-TB Gold Plus   No orders of the defined types were placed in this encounter.   Follow-Up Instructions: Return in about 3 months (around 03/26/2022) for Psoriatic arthritis, Osteoarthritis, Fibromyalgia.   Ofilia Neas, PA-C  Note - This record has been created using Dragon software.  Chart creation errors have been sought, but may not always  have been located. Such creation errors do not reflect on  the standard of medical care.

## 2021-12-25 ENCOUNTER — Encounter: Payer: Self-pay | Admitting: Physician Assistant

## 2021-12-25 ENCOUNTER — Ambulatory Visit: Payer: BC Managed Care – PPO | Attending: Physician Assistant | Admitting: Physician Assistant

## 2021-12-25 VITALS — BP 153/99 | HR 73 | Resp 18 | Ht 75.0 in | Wt 323.4 lb

## 2021-12-25 DIAGNOSIS — M533 Sacrococcygeal disorders, not elsewhere classified: Secondary | ICD-10-CM

## 2021-12-25 DIAGNOSIS — G8929 Other chronic pain: Secondary | ICD-10-CM

## 2021-12-25 DIAGNOSIS — Z9889 Other specified postprocedural states: Secondary | ICD-10-CM

## 2021-12-25 DIAGNOSIS — L4050 Arthropathic psoriasis, unspecified: Secondary | ICD-10-CM | POA: Diagnosis not present

## 2021-12-25 DIAGNOSIS — M797 Fibromyalgia: Secondary | ICD-10-CM

## 2021-12-25 DIAGNOSIS — M7061 Trochanteric bursitis, right hip: Secondary | ICD-10-CM

## 2021-12-25 DIAGNOSIS — M5030 Other cervical disc degeneration, unspecified cervical region: Secondary | ICD-10-CM

## 2021-12-25 DIAGNOSIS — Z8669 Personal history of other diseases of the nervous system and sense organs: Secondary | ICD-10-CM

## 2021-12-25 DIAGNOSIS — L409 Psoriasis, unspecified: Secondary | ICD-10-CM | POA: Diagnosis not present

## 2021-12-25 DIAGNOSIS — Z79899 Other long term (current) drug therapy: Secondary | ICD-10-CM | POA: Diagnosis not present

## 2021-12-25 DIAGNOSIS — Z111 Encounter for screening for respiratory tuberculosis: Secondary | ICD-10-CM

## 2021-12-25 DIAGNOSIS — M170 Bilateral primary osteoarthritis of knee: Secondary | ICD-10-CM

## 2021-12-25 DIAGNOSIS — Z8679 Personal history of other diseases of the circulatory system: Secondary | ICD-10-CM

## 2021-12-25 DIAGNOSIS — M1A09X0 Idiopathic chronic gout, multiple sites, without tophus (tophi): Secondary | ICD-10-CM

## 2021-12-25 DIAGNOSIS — M25512 Pain in left shoulder: Secondary | ICD-10-CM

## 2021-12-25 NOTE — Patient Instructions (Signed)
Hip Bursitis Rehab Ask your health care provider which exercises are safe for you. Do exercises exactly as told by your health care provider and adjust them as directed. It is normal to feel mild stretching, pulling, tightness, or discomfort as you do these exercises. Stop right away if you feel sudden pain or your pain gets worse. Do not begin these exercises until told by your health care provider. Stretching exercise This exercise warms up your muscles and joints and improves the movement and flexibility of your hip. This exercise also helps to relieve pain and stiffness. Iliotibial band stretch An iliotibial band is a strong band of muscle tissue that runs from the outer side of your hip to the outer side of your thigh and knee. Lie on your side with your left / right leg in the top position. Bend your left / right knee and grab your ankle. Stretch out your bottom arm to help you balance. Slowly bring your knee back so your thigh is slightly behind your body. Slowly lower your knee toward the floor until you feel a gentle stretch on the outside of your left / right thigh. If you do not feel a stretch and your knee will not lower more toward the floor, place the heel of your other foot on top of your knee and pull your knee down toward the floor with your foot. Hold this position for __________ seconds. Slowly return to the starting position. Repeat __________ times. Complete this exercise __________ times a day. Strengthening exercises These exercises build strength and endurance in your hip and pelvis. Endurance is the ability to use your muscles for a long time, even after they get tired. Bridge This exercise strengthens the muscles that move your thigh backward (hip extensors). Lie on your back on a firm surface with your knees bent and your feet flat on the floor. Tighten your buttocks muscles and lift your buttocks off the floor until your trunk is level with your thighs. Do not arch your  back. You should feel the muscles working in your buttocks and the back of your thighs. If you do not feel these muscles, slide your feet 1-2 inches (2.5-5 cm) farther away from your buttocks. If this exercise is too easy, try doing it with your arms crossed over your chest. Hold this position for __________ seconds. Slowly lower your hips to the starting position. Let your muscles relax completely after each repetition. Repeat __________ times. Complete this exercise __________ times a day. Squats This exercise strengthens the muscles in front of your thigh and knee (quadriceps). Stand in front of a table, with your feet and knees pointing straight ahead. You may rest your hands on the table for balance but not for support. Slowly bend your knees and lower your hips like you are going to sit in a chair. Keep your weight over your heels, not over your toes. Keep your lower legs upright so they are parallel with the table legs. Do not let your hips go lower than your knees. Do not bend lower than told by your health care provider. If your hip pain increases, do not bend as low. Hold the squat position for __________ seconds. Slowly push with your legs to return to standing. Do not use your hands to pull yourself to standing. Repeat __________ times. Complete this exercise __________ times a day. Hip hike  Stand sideways on a bottom step. Stand on your left / right leg with your other foot unsupported next to   the step. You can hold on to the railing or wall for balance if needed. Keep your knees straight and your torso square. Then lift your left / right hip up toward the ceiling. Hold this position for __________ seconds. Slowly let your left / right hip lower toward the floor, past the starting position. Your foot should get closer to the floor. Do not lean or bend your knees. Repeat __________ times. Complete this exercise __________ times a day. Single leg stand This exercise increases  your balance. Without shoes, stand near a railing or in a doorway. You may hold on to the railing or door frame as needed for balance. Squeeze your left / right buttock muscles, then lift up your other foot. Do not let your left / right hip push out to the side. It is helpful to stand in front of a mirror for this exercise so you can watch your hip. Hold this position for __________ seconds. Repeat __________ times. Complete this exercise __________ times a day. This information is not intended to replace advice given to you by your health care provider. Make sure you discuss any questions you have with your health care provider. Document Revised: 03/13/2021 Document Reviewed: 03/13/2021 Elsevier Patient Education  2023 [employer]  

## 2021-12-26 LAB — CBC WITH DIFFERENTIAL/PLATELET
Absolute Monocytes: 740 cells/uL (ref 200–950)
Basophils Absolute: 43 cells/uL (ref 0–200)
Basophils Relative: 0.5 %
Eosinophils Absolute: 247 cells/uL (ref 15–500)
Eosinophils Relative: 2.9 %
HCT: 54 % — ABNORMAL HIGH (ref 38.5–50.0)
Hemoglobin: 18.6 g/dL — ABNORMAL HIGH (ref 13.2–17.1)
Lymphs Abs: 2703 cells/uL (ref 850–3900)
MCH: 32.6 pg (ref 27.0–33.0)
MCHC: 34.4 g/dL (ref 32.0–36.0)
MCV: 94.7 fL (ref 80.0–100.0)
MPV: 9.6 fL (ref 7.5–12.5)
Monocytes Relative: 8.7 %
Neutro Abs: 4769 cells/uL (ref 1500–7800)
Neutrophils Relative %: 56.1 %
Platelets: 232 10*3/uL (ref 140–400)
RBC: 5.7 10*6/uL (ref 4.20–5.80)
RDW: 13.1 % (ref 11.0–15.0)
Total Lymphocyte: 31.8 %
WBC: 8.5 10*3/uL (ref 3.8–10.8)

## 2021-12-26 LAB — COMPLETE METABOLIC PANEL WITH GFR
AG Ratio: 1.8 (calc) (ref 1.0–2.5)
ALT: 40 U/L (ref 9–46)
AST: 29 U/L (ref 10–40)
Albumin: 5.1 g/dL (ref 3.6–5.1)
Alkaline phosphatase (APISO): 78 U/L (ref 36–130)
BUN: 15 mg/dL (ref 7–25)
CO2: 26 mmol/L (ref 20–32)
Calcium: 10.1 mg/dL (ref 8.6–10.3)
Chloride: 100 mmol/L (ref 98–110)
Creat: 1.1 mg/dL (ref 0.60–1.29)
Globulin: 2.8 g/dL (calc) (ref 1.9–3.7)
Glucose, Bld: 83 mg/dL (ref 65–99)
Potassium: 4.3 mmol/L (ref 3.5–5.3)
Sodium: 138 mmol/L (ref 135–146)
Total Bilirubin: 1 mg/dL (ref 0.2–1.2)
Total Protein: 7.9 g/dL (ref 6.1–8.1)
eGFR: 87 mL/min/{1.73_m2} (ref >=60)

## 2021-12-26 NOTE — Progress Notes (Signed)
CMP WNL.  Hgb and hct are elevated. Rest of CBC WNL. We will continue to monitor lab work closely.

## 2022-01-07 ENCOUNTER — Other Ambulatory Visit: Payer: Self-pay | Admitting: Physician Assistant

## 2022-01-07 DIAGNOSIS — M1A09X0 Idiopathic chronic gout, multiple sites, without tophus (tophi): Secondary | ICD-10-CM

## 2022-01-07 NOTE — Telephone Encounter (Signed)
Next Visit: 03/31/2022  Last Visit: 12/25/2021  Last Fill: 10/01/2021  DX: Idiopathic chronic gout of multiple sites without tophus   Current Dose per office note 12/25/2021: allopurinol 300 mg 1 tablet by mouth daily   Labs: 12/25/2021 CMP WNL. Hgb and hct are elevated. Rest of CBC WNL.   Okay to refill Allopurinol?

## 2022-02-04 DIAGNOSIS — M1A09X0 Idiopathic chronic gout, multiple sites, without tophus (tophi): Secondary | ICD-10-CM

## 2022-02-04 MED ORDER — ALLOPURINOL 300 MG PO TABS: 300.0000 mg | ORAL_TABLET | Freq: Every day | ORAL | 0 refills | Status: DC

## 2022-02-04 NOTE — Telephone Encounter (Signed)
Next Visit: 03/31/2022   Last Visit: 12/25/2021   DX: Idiopathic chronic gout of multiple sites without tophus    Current Dose per office note 12/25/2021: allopurinol 300 mg 1 tablet by mouth daily    Labs: 12/25/2021 CMP WNL. Hgb and hct are elevated. Rest of CBC WNL.    Okay to refill Allopurinol?

## 2022-02-05 ENCOUNTER — Other Ambulatory Visit: Payer: Self-pay | Admitting: Physician Assistant

## 2022-02-05 DIAGNOSIS — Z79899 Other long term (current) drug therapy: Secondary | ICD-10-CM

## 2022-02-05 DIAGNOSIS — L409 Psoriasis, unspecified: Secondary | ICD-10-CM

## 2022-02-05 DIAGNOSIS — L4050 Arthropathic psoriasis, unspecified: Secondary | ICD-10-CM

## 2022-02-05 NOTE — Telephone Encounter (Signed)
Next Visit: 03/31/2022  Last Visit: 12/25/2021  Last Fill: 11/19/2021  [id number] arthritis   Current Dose per office note 12/25/2021: Taltz 80 mg sq injections every 28 days  Labs:  12/25/2021 CMP WNL. Hgb and hct are elevated. Rest of CBC WNL.   TB Gold: 03/25/2021 Neg    Okay to refill Taltz?

## 2022-02-09 ENCOUNTER — Other Ambulatory Visit: Payer: Self-pay | Admitting: Physician Assistant

## 2022-02-09 DIAGNOSIS — L409 Psoriasis, unspecified: Secondary | ICD-10-CM

## 2022-02-09 DIAGNOSIS — L4050 Arthropathic psoriasis, unspecified: Secondary | ICD-10-CM

## 2022-02-09 DIAGNOSIS — Z79899 Other long term (current) drug therapy: Secondary | ICD-10-CM

## 2022-02-11 DIAGNOSIS — R079 Chest pain, unspecified: Secondary | ICD-10-CM | POA: Diagnosis not present

## 2022-02-11 DIAGNOSIS — I4510 Unspecified right bundle-branch block: Secondary | ICD-10-CM | POA: Diagnosis not present

## 2022-02-11 DIAGNOSIS — R0789 Other chest pain: Secondary | ICD-10-CM | POA: Diagnosis not present

## 2022-02-11 DIAGNOSIS — I10 Essential (primary) hypertension: Secondary | ICD-10-CM | POA: Diagnosis not present

## 2022-02-11 DIAGNOSIS — K219 Gastro-esophageal reflux disease without esophagitis: Secondary | ICD-10-CM | POA: Diagnosis not present

## 2022-02-11 DIAGNOSIS — M797 Fibromyalgia: Secondary | ICD-10-CM | POA: Diagnosis not present

## 2022-02-12 DIAGNOSIS — K219 Gastro-esophageal reflux disease without esophagitis: Secondary | ICD-10-CM | POA: Diagnosis not present

## 2022-02-12 DIAGNOSIS — I10 Essential (primary) hypertension: Secondary | ICD-10-CM | POA: Diagnosis not present

## 2022-02-12 DIAGNOSIS — I4510 Unspecified right bundle-branch block: Secondary | ICD-10-CM | POA: Diagnosis not present

## 2022-02-12 DIAGNOSIS — M797 Fibromyalgia: Secondary | ICD-10-CM | POA: Diagnosis not present

## 2022-02-19 DIAGNOSIS — R062 Wheezing: Secondary | ICD-10-CM | POA: Diagnosis not present

## 2022-02-19 DIAGNOSIS — I10 Essential (primary) hypertension: Secondary | ICD-10-CM | POA: Diagnosis not present

## 2022-03-08 ENCOUNTER — Other Ambulatory Visit (HOSPITAL_COMMUNITY): Payer: Self-pay

## 2022-03-18 NOTE — Progress Notes (Unsigned)
Office Visit Note  Patient: Miguel Evans             Date of Birth: [birth year]           MRN: [id number]             PCP: Enid Skeens., MD Referring: Enid Skeens., MD Visit Date: 03/31/2022 Occupation: _0 @  Subjective:  Pain in multiple joints   History of Present Illness: Alven Alverio is a 41 y.o. male with history of psoriatic arthritis, osteoarthritis, gout, and fibromyalgia. He remains on Taltz 80 mg sq injections every 28 days.  He continues to tolerate Taltz without any side effects or injection site reactions.  He has not missed any doses recently.  Patient reports that he has had persistent discomfort in his right hip since his last office visit.  He has interrupted sleep at night when lying on his right side due to trochanter bursitis.  He requested a right trochanteric bursa cortisone injection today.  He is also been experiencing intermittent discomfort in the left SI joint which she attributes to gait changes secondary to his right hip pain.  He typically tries to sleep on his back to take the pressure off his hips at night.  He uses a wedge under his legs while sleeping.  He denies any joint swelling at this time.  He continues to have chronic pain in his neck, left shoulder, and left knee but has not noticed any increased inflammation.   He denies any signs or symptoms of a gout flare.  He continues to take allopurinol 300 mg daily and colchicine 0.6 mg 1 tablet daily as needed during flares. He has not had any recent or recurrent infections.     Activities of Daily Living:  Patient reports morning stiffness for 1 hour.   Patient Reports nocturnal pain.  Difficulty dressing/grooming: Reports Difficulty climbing stairs: Reports Difficulty getting out of chair: Reports Difficulty using hands for taps, buttons, cutlery, and/or writing: Reports  Review of Systems  Constitutional:  Positive for fatigue.  HENT:  Negative for mouth sores and mouth dryness.   Eyes:   Positive for dryness.  Respiratory:  Negative for shortness of breath.   Cardiovascular:  Positive for palpitations. Negative for chest pain.  Gastrointestinal:  Negative for blood in stool, constipation and diarrhea.  Endocrine: Negative for increased urination.  Genitourinary:  Negative for involuntary urination.  Musculoskeletal:  Positive for joint pain, gait problem, joint pain and morning stiffness. Negative for joint swelling, myalgias, muscle weakness, muscle tenderness and myalgias.  Skin:  Positive for color change. Negative for rash, hair loss and sensitivity to sunlight.  Allergic/Immunologic: Negative for susceptible to infections.  Neurological:  Positive for dizziness and numbness. Negative for headaches.  Hematological:  Negative for swollen glands.  Psychiatric/Behavioral:  Positive for depressed mood and sleep disturbance. The patient is nervous/anxious.     PMFS History:  Patient Active Problem List   Diagnosis Date Noted   Cervical disc disorder with radiculopathy 02/22/2019   Myofascial pain syndrome 02/22/2019   Tear of right rotator cuff 09/23/2018   Psoriasis 03/27/2017   Polyarthritis 02/24/2017   Patellofemoral arthralgia of both knees 02/24/2017   Hyperuricemia 03/26/2016   Elevated blood pressure  03/26/2016   Elevated LFTs 03/26/2016   Anxiety 03/26/2016   Psoriatic arthritis ([place]) 03/25/2016   High risk medication use 03/25/2016   Cervicalgia 03/25/2016   Hand pain 03/25/2016   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    Past  Medical History:  Diagnosis Date   Anxiety    Arthritis    Gout    Hypertension    Psoriasis    Psoriatic arthritis ([place])    Sleep apnea     Family History  Problem Relation Age of Onset   Cancer Mother        breast    Bipolar disorder Mother    Hypertension Father    Osteoarthritis Father    Emphysema Maternal Grandfather    CAD Paternal Grandfather    Healthy Son    Healthy Son    Healthy Daughter    Healthy  Daughter    Past Surgical History:  Procedure Laterality Date   ANTERIOR CRUCIATE LIGAMENT REPAIR Bilateral Left in 2002, Right 2013   ANTERIOR CRUCIATE LIGAMENT REPAIR Bilateral 2014   ROTATOR CUFF REPAIR Left 06/05/2020   SHOULDER ARTHROSCOPY WITH SUBACROMIAL DECOMPRESSION AND OPEN ROTATOR C Right 09/23/2018   Procedure: SHOULDER ARTHROSCOPY WITH SUBACROMIAL DECOMPRESSION AND OPEN ROTATOR CUFF REPAIR,;  Surgeon: Garald Balding, MD;  Location: [facility];  Service: Orthopedics;  Laterality: Right;  WITH BLOCK   VASECTOMY     Social History   Social History Narrative   Right handed   4 children   One story home    There is no immunization history on file for this patient.   Objective: Vital Signs: BP 133/88 (BP Location: Right Arm, Patient Position: Supine, Cuff Size: Large)   Pulse 74   Resp 18   Ht _0  (1.905 m)   Wt (!) 316 lb (143.3 kg)   BMI 39.50 kg/m    Physical Exam Vitals and nursing note reviewed.  Constitutional:      Appearance: He is well-developed.  HENT:     Head: Normocephalic and atraumatic.  Eyes:     Conjunctiva/sclera: Conjunctivae normal.     Pupils: Pupils are equal, round, and reactive to light.  Cardiovascular:     Rate and Rhythm: Normal rate and regular rhythm.     Heart sounds: Normal heart sounds.  Pulmonary:     Effort: Pulmonary effort is normal.     Breath sounds: Normal breath sounds.  Abdominal:     General: Bowel sounds are normal.     Palpations: Abdomen is soft.  Musculoskeletal:     Cervical back: Normal range of motion and neck supple.  Skin:    General: Skin is warm and dry.     Capillary Refill: Capillary refill takes less than 2 seconds.  Neurological:     Mental Status: He is alert and oriented to person, place, and time.  Psychiatric:        Behavior: Behavior normal.      Musculoskeletal Exam: C-spine has painful ROM with lateral rotation.  Tenderness over the left SI joint.  Shoulder joints have good ROM with  some discomfort and tenderness of the left shoulder.  Elbow joints have good ROM.  Wrist joints, MCPs, PIPs, and DIPs good ROM with no synovitis.  Tenderness over several MCP joints.  Hip joints have good ROM with discomfort in the right hip.  Tenderness over the right trochanteric bursa.  Painful ROM of the left knee.  Baker's cyst palpable behind the left knee.  Ankle joints have good ROM with no tenderness or joint swelling.  No evidence of achilles tendonitis or plantar fasciitis.  Mild tenderness and thickening of the right 1st MTP joint.   CDAI Exam: CDAI Score: -- Patient Global: --; Provider Global: -- Swollen:  0 ; Tender: 2  Joint Exam 03/31/2022      Right  Left  PIP 2      Tender  PIP 3      Tender     Investigation: No additional findings.  Imaging: No results found.  Recent Labs: Lab Results  Component Value Date   WBC 8.5 12/25/2021   HGB 18.6 (H) 12/25/2021   PLT 232 12/25/2021   NA 138 12/25/2021   K 4.3 12/25/2021   CL 100 12/25/2021   CO2 26 12/25/2021   GLUCOSE 83 12/25/2021   BUN 15 12/25/2021   CREATININE 1.10 12/25/2021   BILITOT 1.0 12/25/2021   ALKPHOS 74 10/24/2016   AST 29 12/25/2021   ALT 40 12/25/2021   PROT 7.9 12/25/2021   ALBUMIN 4.7 10/24/2016   CALCIUM 10.1 12/25/2021   GFRAA 103 08/31/2020   QFTBGOLDPLUS NEGATIVE 03/25/2021    Speciality Comments: Humira, Enbrel12/17-06/18, Cosyntex 06/18-09/20,Taltz 10/20 MTX -high LFTs  Procedures:  Large Joint Inj: R greater trochanter on 03/31/2022 3:59 PM Indications: pain Details: 27 G 1.5 in needle, lateral approach  Arthrogram: No  Medications: 1.5 mL lidocaine 1 %; 40 mg triamcinolone acetonide 40 MG/ML Aspirate: 0 mL Outcome: tolerated well, no immediate complications Procedure, treatment alternatives, risks and benefits explained, specific risks discussed. Consent was given by the patient. Immediately prior to procedure a time out was called to verify the correct patient, procedure,  equipment, support staff and site/side marked as required. Patient was prepped and draped in the usual sterile fashion.     Allergies: Patient has no known allergies.     Assessment / Plan:     Visit Diagnoses: Psoriatic arthritis ([place]) - ESR WNL 09/24/21: He has no synovitis or dactylitis on examination today.  He has not had any signs or symptoms of a psoriatic arthritis flare.  Overall he has clinically been doing well on Taltz 80 mg subcutaneous injections every 28 days.  He continues to tolerate Taltz without any side effects or injection site reactions.  He has not had any recent or recurrent infections.  No evidence of Achilles tendinitis or plantar fasciitis.  Mild left SI joint tenderness upon palpation.  No active psoriasis currently.  He continues to have chronic pain in his neck, left shoulder, and left knee joint but has not had any active inflammation.  He will remain on Taltz as prescribed.  He was advised to notify us if he develops signs or symptoms of a flare.  He will follow-up in the office in 3 months or sooner if needed.  Psoriasis: He has no active psoriasis at this time.  High risk medication use - Taltz 80 mg sq injections every 28 days.  Methotrexate-d/c due to elevated LFTs.  CBC and CMP updated on 12/25/21.  Orders for CBC and CMP released today.  His next lab work will be due in March and every 3 months to monitor for drug toxicity. TB gold negative on 03/25/21.  Order for TB gold released.  Discussed the importance of holding taltz if he develops signs or symptoms of an infection and to resume once the infection has completely cleared.  - Plan: COMPLETE METABOLIC PANEL WITH GFR, CBC with Differential/Platelet, QuantiFERON-TB Gold Plus  Screening for tuberculosis - Order for TB gold released today. Plan: QuantiFERON-TB Gold Plus  S/P right rotator cuff repair: Doing well.  Good ROM of the right shoulder with no discomfort.   Chronic left shoulder pain: He continues  to have chronic pain  and stiffness in the left shoulder joint.  Primary osteoarthritis of both knees: He has good range of motion of both knee joints on examination today.  Baker's cyst palpable behind the left knee noted.  No warmth or effusion noted.  Patient's handicap lacquer was renewed today.  Chronic pain of left knee - He continues to experience intermittent pain and instability in the left knee.  Baker's cyst palpable behind the left knee noted.  He has some tenderness over the left head of the fibula. X-rays of the left knee were obtained today to assess for radiographic progression. Plan: XR KNEE 3 VIEW LEFT  Chronic left SI joint pain: He has mild SI joint tenderness upon palpation today.  He experiences intermittent nocturnal pain.  He was advised to notify us if his symptoms persist or worsen at which time he can return for a left SI joint cortisone injection in the future.  Idiopathic chronic gout of multiple sites without tophus -He has not had any signs or symptoms of a gout flare.  He has clinically been doing well taking allopurinol 300 mg 1 tablet by mouth daily and colchicine as needed during flares.  Uric acid level be rechecked today.  Ideally his uric acid level should remain less than 6.  He was advised to notify us if he develops signs or symptoms of a flare.- Plan: Uric acid  Trochanteric bursitis, right hip: He presents today with ongoing pain on the lateral aspect of his right hip consistent with trochanteric bursitis.  He has tried stretching exercises at home with no improvement in his symptoms.  Different treatment options were discussed today.  He requested a right trochanteric bursa cortisone injection today.  He tolerated the procedure well. Procedure note was completed above.  Aftercare was discussed.  He will notify us if his symptoms persist or worsen.   DDD (degenerative disc disease), cervical: Chronic pain.  Painful and limited lateral rotation.   Fibromyalgia -  He continues to experience intermittent myalgias and muscle tenderness.  He has nocturnal pain which causes interrupted sleep at night.  He has chronic fatigue secondary to insomnia.  He remains on Cymbalta 60 mg daily.  Discussed the importance of regular exercise and good sleep hygiene.   Other medical conditions are listed as follows:   History of hypertension: BP was 133/88 today in the office. Advised to monitor blood pressure closely following the cortisone injection today.   History of sleep apnea   Orders: Orders Placed This Encounter  Procedures   Large Joint Inj   XR KNEE 3 VIEW LEFT   COMPLETE METABOLIC PANEL WITH GFR   CBC with Differential/Platelet   QuantiFERON-TB Gold Plus   Uric acid   No orders of the defined types were placed in this encounter.     Follow-Up Instructions: Return in about 3 months (around 06/30/2022) for Psoriatic arthritis.   Ofilia Neas, PA-C  Note - This record has been created using Dragon software.  Chart creation errors have been sought, but may not always  have been located. Such creation errors do not reflect on  the standard of medical care.

## 2022-03-19 DIAGNOSIS — F411 Generalized anxiety disorder: Secondary | ICD-10-CM | POA: Diagnosis not present

## 2022-03-19 DIAGNOSIS — I10 Essential (primary) hypertension: Secondary | ICD-10-CM | POA: Diagnosis not present

## 2022-03-31 ENCOUNTER — Ambulatory Visit (INDEPENDENT_AMBULATORY_CARE_PROVIDER_SITE_OTHER): Payer: BC Managed Care – PPO

## 2022-03-31 ENCOUNTER — Encounter: Payer: Self-pay | Admitting: Physician Assistant

## 2022-03-31 ENCOUNTER — Ambulatory Visit: Payer: BC Managed Care – PPO | Attending: Physician Assistant | Admitting: Physician Assistant

## 2022-03-31 VITALS — BP 133/88 | HR 74 | Resp 18 | Ht 75.0 in | Wt 316.0 lb

## 2022-03-31 DIAGNOSIS — L4050 Arthropathic psoriasis, unspecified: Secondary | ICD-10-CM

## 2022-03-31 DIAGNOSIS — Z8679 Personal history of other diseases of the circulatory system: Secondary | ICD-10-CM

## 2022-03-31 DIAGNOSIS — Z9889 Other specified postprocedural states: Secondary | ICD-10-CM | POA: Diagnosis not present

## 2022-03-31 DIAGNOSIS — M25512 Pain in left shoulder: Secondary | ICD-10-CM

## 2022-03-31 DIAGNOSIS — M25562 Pain in left knee: Secondary | ICD-10-CM

## 2022-03-31 DIAGNOSIS — L409 Psoriasis, unspecified: Secondary | ICD-10-CM

## 2022-03-31 DIAGNOSIS — M533 Sacrococcygeal disorders, not elsewhere classified: Secondary | ICD-10-CM

## 2022-03-31 DIAGNOSIS — M7061 Trochanteric bursitis, right hip: Secondary | ICD-10-CM

## 2022-03-31 DIAGNOSIS — Z79899 Other long term (current) drug therapy: Secondary | ICD-10-CM | POA: Diagnosis not present

## 2022-03-31 DIAGNOSIS — G8929 Other chronic pain: Secondary | ICD-10-CM | POA: Diagnosis not present

## 2022-03-31 DIAGNOSIS — Z111 Encounter for screening for respiratory tuberculosis: Secondary | ICD-10-CM | POA: Diagnosis not present

## 2022-03-31 DIAGNOSIS — M797 Fibromyalgia: Secondary | ICD-10-CM

## 2022-03-31 DIAGNOSIS — Z8669 Personal history of other diseases of the nervous system and sense organs: Secondary | ICD-10-CM

## 2022-03-31 DIAGNOSIS — M1A09X0 Idiopathic chronic gout, multiple sites, without tophus (tophi): Secondary | ICD-10-CM | POA: Diagnosis not present

## 2022-03-31 DIAGNOSIS — M170 Bilateral primary osteoarthritis of knee: Secondary | ICD-10-CM

## 2022-03-31 DIAGNOSIS — M5030 Other cervical disc degeneration, unspecified cervical region: Secondary | ICD-10-CM

## 2022-03-31 MED ORDER — LIDOCAINE HCL 1 % IJ SOLN
1.5000 mL | INTRAMUSCULAR | Status: AC | PRN
  Administered 2022-03-31: 1.5 mL

## 2022-03-31 MED ORDER — TRIAMCINOLONE ACETONIDE 40 MG/ML IJ SUSP
40.0000 mg | INTRAMUSCULAR | Status: AC | PRN
  Administered 2022-03-31: 40 mg via INTRA_ARTICULAR

## 2022-04-01 NOTE — Progress Notes (Signed)
Uric acid is slightly elevated-6.8 but is trending down.  Ideally uric acid should be less than 6.   Continue on current dose of allopurinol and avoid a purine rich diet.   CBC stable. Glucose is 114. Calcium is elevated-10.7. please clarify if he has been taking a calcium supplement?  Total bilirubin is elevated-1.6. please forward results to PCP.  LFTs WNL.

## 2022-04-02 LAB — COMPLETE METABOLIC PANEL WITH GFR
AG Ratio: 2 (calc) (ref 1.0–2.5)
ALT: 37 U/L (ref 9–46)
AST: 27 U/L (ref 10–40)
Albumin: 5.3 g/dL — ABNORMAL HIGH (ref 3.6–5.1)
Alkaline phosphatase (APISO): 78 U/L (ref 36–130)
BUN: 12 mg/dL (ref 7–25)
CO2: 29 mmol/L (ref 20–32)
Calcium: 10.7 mg/dL — ABNORMAL HIGH (ref 8.6–10.3)
Chloride: 102 mmol/L (ref 98–110)
Creat: 1.06 mg/dL (ref 0.60–1.29)
Globulin: 2.7 g/dL (calc) (ref 1.9–3.7)
Glucose, Bld: 114 mg/dL — ABNORMAL HIGH (ref 65–99)
Potassium: 4.7 mmol/L (ref 3.5–5.3)
Sodium: 143 mmol/L (ref 135–146)
Total Bilirubin: 1.6 mg/dL — ABNORMAL HIGH (ref 0.2–1.2)
Total Protein: 8 g/dL (ref 6.1–8.1)
eGFR: 91 mL/min/{1.73_m2} (ref >=60)

## 2022-04-02 LAB — QUANTIFERON-TB GOLD PLUS
Mitogen-NIL: >10 IU/mL
NIL: 0.02 IU/mL
QuantiFERON-TB Gold Plus: NEGATIVE
TB1-NIL: 0 IU/mL
TB2-NIL: 0.01 IU/mL

## 2022-04-02 LAB — CBC WITH DIFFERENTIAL/PLATELET
Absolute Monocytes: 594 cells/uL (ref 200–950)
Basophils Absolute: 59 cells/uL (ref 0–200)
Basophils Relative: 0.9 %
Eosinophils Absolute: 231 cells/uL (ref 15–500)
Eosinophils Relative: 3.5 %
HCT: 52.2 % — ABNORMAL HIGH (ref 38.5–50.0)
Hemoglobin: 18 g/dL — ABNORMAL HIGH (ref 13.2–17.1)
Lymphs Abs: 2231 cells/uL (ref 850–3900)
MCH: 32.3 pg (ref 27.0–33.0)
MCHC: 34.5 g/dL (ref 32.0–36.0)
MCV: 93.5 fL (ref 80.0–100.0)
MPV: 9.7 fL (ref 7.5–12.5)
Monocytes Relative: 9 %
Neutro Abs: 3485 cells/uL (ref 1500–7800)
Neutrophils Relative %: 52.8 %
Platelets: 278 10*3/uL (ref 140–400)
RBC: 5.58 10*6/uL (ref 4.20–5.80)
RDW: 13.1 % (ref 11.0–15.0)
Total Lymphocyte: 33.8 %
WBC: 6.6 10*3/uL (ref 3.8–10.8)

## 2022-04-02 LAB — URIC ACID: Uric Acid, Serum: 6.8 mg/dL (ref 4.0–8.0)

## 2022-04-02 NOTE — Progress Notes (Signed)
Moderate medial and lateral compartment narrowing with lateral osteophytes.  Moderate patellofemoral narrowing-findings are consistent with moderate psoriatic arthritis and osteoarthritis overlap. Please notify the patient.

## 2022-04-03 NOTE — Progress Notes (Signed)
TB gold negative

## 2022-04-16 DIAGNOSIS — I10 Essential (primary) hypertension: Secondary | ICD-10-CM | POA: Diagnosis not present

## 2022-05-02 ENCOUNTER — Other Ambulatory Visit: Payer: Self-pay | Admitting: Rheumatology

## 2022-05-02 DIAGNOSIS — M1A09X0 Idiopathic chronic gout, multiple sites, without tophus (tophi): Secondary | ICD-10-CM

## 2022-05-02 NOTE — Telephone Encounter (Signed)
Next Visit: 07/04/2022  Last Visit: 03/31/2022  Last Fill: 02/04/2022  DX: Idiopathic chronic gout of multiple sites without tophus   Current Dose per office note 03/31/2022: allopurinol 300 mg 1 tablet by mouth daily   Labs: 03/31/2022 Uric acid is slightly elevated-6.8 but is trending down.  Ideally uric acid should be less than 6.   Continue on current dose of allopurinol and avoid a purine rich diet.   CBC stable. Glucose is 114. Calcium is elevated-10.7. Total bilirubin is elevated-1.6. LFTs WNL.  Okay to refill Allopurinol?

## 2022-05-20 ENCOUNTER — Other Ambulatory Visit: Payer: Self-pay | Admitting: Physician Assistant

## 2022-05-20 DIAGNOSIS — L4050 Arthropathic psoriasis, unspecified: Secondary | ICD-10-CM

## 2022-05-20 DIAGNOSIS — Z79899 Other long term (current) drug therapy: Secondary | ICD-10-CM

## 2022-05-20 DIAGNOSIS — L409 Psoriasis, unspecified: Secondary | ICD-10-CM

## 2022-05-20 NOTE — Telephone Encounter (Signed)
Next Visit: 07/04/2022  Last Visit: 03/31/2022  Last Fill: 02/05/2022  [id number] arthritis   Current Dose per office note 03/31/2022: Taltz 80 mg sq injections every 28 days.   Labs: 03/31/2022 CBC stable. Glucose is 114. Calcium is elevated-10.7.Total bilirubin is elevated-1.6. LFTs WNL.   TB Gold: 03/31/2022 Neg    Okay to refill Taltz?

## 2022-05-24 ENCOUNTER — Other Ambulatory Visit: Payer: Self-pay | Admitting: Physician Assistant

## 2022-05-24 DIAGNOSIS — L4050 Arthropathic psoriasis, unspecified: Secondary | ICD-10-CM

## 2022-05-24 DIAGNOSIS — L409 Psoriasis, unspecified: Secondary | ICD-10-CM

## 2022-05-24 DIAGNOSIS — Z79899 Other long term (current) drug therapy: Secondary | ICD-10-CM

## 2022-06-12 ENCOUNTER — Other Ambulatory Visit: Payer: Self-pay

## 2022-06-12 DIAGNOSIS — L4050 Arthropathic psoriasis, unspecified: Secondary | ICD-10-CM

## 2022-06-12 DIAGNOSIS — L409 Psoriasis, unspecified: Secondary | ICD-10-CM

## 2022-06-12 DIAGNOSIS — Z79899 Other long term (current) drug therapy: Secondary | ICD-10-CM

## 2022-06-12 MED ORDER — TALTZ 80 MG/ML ~~LOC~~ SOAJ: 80.0000 mg | SUBCUTANEOUS | 2 refills | Status: DC

## 2022-06-12 NOTE — Telephone Encounter (Signed)
Refill request received via fax from [facility] for Miguel Evans  Next Visit: 07/07/2022  Last Visit: 03/31/2022  Last Fill: 05/20/2022  [id number] arthritis   Current Dose per office note 03/31/2022: Taltz 80 mg sq injections every 28 days   Labs: 03/31/2022 CBC stable. Glucose is 114. Calcium is elevated-10.7. please clarify if he has been taking a calcium supplement? Total bilirubin is elevated-1.6. please forward results to PCP. LFTs WNL.  TB Gold: 03/31/2022 Negative   Patient will update labs at upcoming appointment.   Okay to refill Taltz?

## 2022-06-24 NOTE — Progress Notes (Signed)
Office Visit Note  Patient: Miguel Evans             Date of Birth: June 05, 1980           MRN: [id number]             PCP: Enid Skeens., MD Referring: Enid Skeens., MD Visit Date: 07/07/2022 Occupation: @GUAROCC @  Subjective:  Generalized aching   History of Present Illness: Miguel Evans is a 42 y.o. male with history of psoriatic arthritis, gout, fibromyalgia, osteoarthritis, and DDD.  Patient is currently taking Taltz 80 mg sq injections every 28 days.  He is tolerating Taltz without any side effects or injection site reactions.  He has not missed any doses recently.  He denies any recent or recurrent infections.  He denies any active psoriasis at this time.  Patient states that he noticed improvement in his discomfort from trochanter bursitis after having a right trochanter bursa cortisone injection at his last office visit.  He has had some increased discomfort in the right groin and has ongoing pain in the left knee.  He has crepitus in both knee joints.  He states that while walking he feels like his legs are concrete at times.  He states that most of his discomfort is secondary to fibromyalgia and neuralgias.  He remains on Cymbalta 30 mg 1 capsule twice daily.  He has generalized soreness but denies any joint swelling at this time.  He denies any Achilles tendinitis or plantar fasciitis at this time.  He denies any recent gout flares.  He is taking allopurinol 300 mg 1 tablet by mouth daily and colchicine 0.6 mg 1 tablet daily as needed during flares.     Activities of Daily Living:  Patient reports morning stiffness for 60-90 minutes.   Patient Reports nocturnal pain.  Difficulty dressing/grooming: Reports Difficulty climbing stairs: Reports Difficulty getting out of chair: Reports Difficulty using hands for taps, buttons, cutlery, and/or writing: Reports  Review of Systems  Constitutional:  Positive for fatigue.  HENT:  Positive for mouth dryness. Negative for mouth sores.    Eyes:  Positive for dryness.  Respiratory:  Negative for shortness of breath.   Cardiovascular:  Positive for palpitations. Negative for chest pain.  Gastrointestinal:  Negative for blood in stool, constipation and diarrhea.  Endocrine: Negative for increased urination.  Genitourinary:  Negative for involuntary urination.  Musculoskeletal:  Positive for joint pain, gait problem, joint pain, joint swelling, myalgias, muscle weakness, morning stiffness, muscle tenderness and myalgias.  Skin:  Negative for color change, rash, hair loss and sensitivity to sunlight.  Allergic/Immunologic: Negative for susceptible to infections.  Neurological:  Positive for headaches. Negative for dizziness.  Hematological:  Negative for swollen glands.  Psychiatric/Behavioral:  Positive for depressed mood and sleep disturbance. The patient is nervous/anxious.     PMFS History:  Patient Active Problem List   Diagnosis Date Noted   Cervical disc disorder with radiculopathy 02/22/2019   Myofascial pain syndrome 02/22/2019   Tear of right rotator cuff 09/23/2018   Psoriasis 03/27/2017   Polyarthritis 02/24/2017   Patellofemoral arthralgia of both knees 02/24/2017   Hyperuricemia 03/26/2016   Elevated blood pressure  03/26/2016   Elevated LFTs 03/26/2016   Anxiety 03/26/2016   Psoriatic arthritis ([place]) 03/25/2016   High risk medication use 03/25/2016   Cervicalgia 03/25/2016   Hand pain 03/25/2016   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    Past Medical History:  Diagnosis Date   Anxiety  Arthritis    Gout    Hypertension    Psoriasis    Psoriatic arthritis ([place])    Sleep apnea     Family History  Problem Relation Age of Onset   Cancer Mother        breast    Bipolar disorder Mother    Hypertension Father    Osteoarthritis Father    Emphysema Maternal Grandfather    CAD Paternal Grandfather    Healthy Son    Healthy Son    Healthy Daughter    Healthy Daughter    Past Surgical  History:  Procedure Laterality Date   ANTERIOR CRUCIATE LIGAMENT REPAIR Bilateral Left in 2002, Right 2013   ANTERIOR CRUCIATE LIGAMENT REPAIR Bilateral 2014   ROTATOR CUFF REPAIR Left 06/05/2020   SHOULDER ARTHROSCOPY WITH SUBACROMIAL DECOMPRESSION AND OPEN ROTATOR C Right 09/23/2018   Procedure: SHOULDER ARTHROSCOPY WITH SUBACROMIAL DECOMPRESSION AND OPEN ROTATOR CUFF REPAIR,;  Surgeon: Garald Balding, MD;  Location: [facility];  Service: Orthopedics;  Laterality: Right;  WITH BLOCK   VASECTOMY     Social History   Social History Narrative   Right handed   4 children   One story home    There is no immunization history on file for this patient.   Objective: Vital Signs: BP (!) 147/108 (BP Location: Left Arm, Patient Position: Sitting, Cuff Size: Normal)   Pulse 86   Resp 14   Ht 6\' 3"  (1.905 m)   Wt (!) 314 lb (142.4 kg)   BMI 39.25 kg/m    Physical Exam Vitals and nursing note reviewed.  Constitutional:      Appearance: He is well-developed.  HENT:     Head: Normocephalic and atraumatic.  Eyes:     Conjunctiva/sclera: Conjunctivae normal.     Pupils: Pupils are equal, round, and reactive to light.  Cardiovascular:     Rate and Rhythm: Normal rate and regular rhythm.     Heart sounds: Normal heart sounds.  Pulmonary:     Effort: Pulmonary effort is normal.     Breath sounds: Normal breath sounds.  Abdominal:     General: Bowel sounds are normal.     Palpations: Abdomen is soft.  Musculoskeletal:     Cervical back: Normal range of motion and neck supple.  Skin:    General: Skin is warm and dry.     Capillary Refill: Capillary refill takes less than 2 seconds.  Neurological:     Mental Status: He is alert and oriented to person, place, and time.  Psychiatric:        Behavior: Behavior normal.      Musculoskeletal Exam: Generalized hyperalgesia.  C-spine, thoracic spine, and lumbar spine good ROM.  Shoulder joints, elbow joints, wrist joints, MCPs, PIPs, and  DIPs good ROM with no synovitis.  Complete fist formation bilaterally.  Tenderness over the medial epicondyle of the right elbow.  Tenderness over bilateral 2nd MCPs and left 3rd MCP joints.  Painful and limited ROM of the right hip.  No tenderness over trochanteric bursa. Bilateral knee crepitus.  No warmth or effusion of knee joints.  No tenderness or swelling of ankle joints.  No evidence of achilles tendonitis or plantar fasciitis.  No tenderness or synovitis of MTP joints.   CDAI Exam: CDAI Score: -- Patient Global: --; Provider Global: -- Swollen: --; Tender: -- Joint Exam 07/07/2022   No joint exam has been documented for this visit   There is currently no information documented  on the homunculus. Go to the Rheumatology activity and complete the homunculus joint exam.  Investigation: No additional findings.  Imaging: No results found.  Recent Labs: Lab Results  Component Value Date   WBC 6.6 03/31/2022   HGB 18.0 (H) 03/31/2022   PLT 278 03/31/2022   NA 143 03/31/2022   K 4.7 03/31/2022   CL 102 03/31/2022   CO2 29 03/31/2022   GLUCOSE 114 (H) 03/31/2022   BUN 12 03/31/2022   CREATININE 1.06 03/31/2022   BILITOT 1.6 (H) 03/31/2022   ALKPHOS 74 10/24/2016   AST 27 03/31/2022   ALT 37 03/31/2022   PROT 8.0 03/31/2022   ALBUMIN 4.7 10/24/2016   CALCIUM 10.7 (H) 03/31/2022   GFRAA 103 08/31/2020   QFTBGOLDPLUS NEGATIVE 03/31/2022    Speciality Comments: Humira, Enbrel12/17-06/18, Cosyntex 06/18-09/20,Taltz 10/20 MTX -high LFTs  Procedures:  No procedures performed Allergies: Patient has no known allergies.  Taltz refill?  Gabapentin    Assessment / Plan:     Visit Diagnoses: Psoriatic arthritis ([place]): No synovitis or dactylitis noted.  He has not had any signs or symptoms of a sort of arthritis flare.  He has no active psoriasis at this time.  No evidence of Achilles tendinitis or plantar fasciitis.  He continues to have some discomfort in both SI joints but  had no tenderness upon palpation today.  Overall his psoriatic arthritis remains well-controlled taking Taltz 80 mg subcutaneous injections every month.  He continues to tolerate Taltz without any side effects or injection site reactions.  He has not had any recent infections.  He continues to have generalized hyperalgesia and intermittent neuralgias.  He has interrupted sleep at night due to pain secondary from fibromyalgia.  Plan to initiate a trial of gabapentin 300 mg 1 capsule at bedtime. He will remain on Taltz as monotherapy for management of psoriatic arthritis.  He was advised to notify us if he develops signs or symptoms of a flare.  He will follow-up in the office in 5 months or sooner if needed.  Psoriasis: He has no active psoriasis at this time.  High risk medication use - Taltz 80 mg sq injections every 28 days.  Methotrexate-d/c due to elevated LFTs -  CBC and CMP updated on 03/31/22. Orders for CBC and CMP released.  TB gold negative on 03/31/22.  No recent or recurrent infections.  Discussed the importance of holding taltz if he develops signs or symptoms of an infection and to resume once the infection has completely cleared.  Plan: CBC with Differential/Platelet, COMPLETE METABOLIC PANEL WITH GFR  S/P right rotator cuff repair: Tenderness of the right shoulder.  Good ROM today.   Chronic left shoulder pain: Some tenderness to palpation of the left shoulder.  Good range of motion noted on examination today.  Primary osteoarthritis of both knees: Bilateral knee crepitus noted.  No warmth or effusion noted.  Baker's cyst palpable behind the left knee.  Offered referral to physical therapy but he has declined at this time.  Encouraged patient to follow back up with orthopedics if his symptoms persist or worsen.  Chronic left SI joint pain: He has been experiencing intermittent discomfort in the left SI joint.  He has interrupted sleep at night due to nocturnal pain.  Plan to initiate  a trial of gabapentin 300 mg 1 capsule at bedtime.  Idiopathic chronic gout of multiple sites without tophus -He has not had any signs or symptoms of a gout flare.  Uric acid was 6.8  on 03/31/2022.  Discussed the importance of avoiding a purine rich diet.  He has clinically been doing well taking allopurinol 300 mg 1 tablet by mouth daily and colchicine as needed during flares.  He does not need any refills at this time.  He is advised to notify us if he develops signs or symptoms of a gout flare.  Trochanteric bursitis, right hip: Resolved.  Patient had a cortisone injection on 03/31/2022 which alleviated his symptoms.  DDD (degenerative disc disease), cervical: C-spine has slightly limited range of motion with lateral rotation.  Some trapezius muscle tension and tenderness bilaterally.  No symptoms of radiculopathy at this time.  Fibromyalgia: Patient presents today with generalized hyperalgesia and positive tender points on examination.  He continues to experience generalized aching and neuralgias secondary to fibromyalgia.  He has been taking Cymbalta 30 mg 1 capsule twice daily which she has found to be helpful.  He continues to have difficulty sleeping at night due to nocturnal pain.  Most of his discomfort seems to be due to underlying fibromyalgia.  He has declined a referral to physical therapy and pain management.  Discussed initiating a trial of gabapentin 300 mg 1 capsule at bedtime.  Discussed possible side effects of taking gabapentin.  All questions were addressed.  A prescription for gabapentin 300 mg 1 capsule at bedtime was sent to the pharmacy today.  He will notify us if he cannot tolerate taking gabapentin.  Other medical conditions are listed as follows:  History of hypertension: Blood pressure was 148/92 today in the office.  His blood pressure was rechecked prior to leaving.  He was advised to monitor his blood pressure closely.  History of sleep apnea  Orders: Orders Placed  This Encounter  Procedures   CBC with Differential/Platelet   COMPLETE METABOLIC PANEL WITH GFR   Meds ordered this encounter  Medications   gabapentin (NEURONTIN) 300 MG capsule    Sig: Take 1 capsule (300 mg total) by mouth at bedtime.    Dispense:  30 capsule    Refill:  0      Follow-Up Instructions: Return in about 5 months (around 12/07/2022) for Psoriatic arthritis, Fibromyalgia, Gout.   Ofilia Neas, PA-C  Note - This record has been created using Dragon software.  Chart creation errors have been sought, but may not always  have been located. Such creation errors do not reflect on  the standard of medical care.

## 2022-07-04 ENCOUNTER — Ambulatory Visit: Payer: BC Managed Care – PPO | Admitting: Physician Assistant

## 2022-07-07 ENCOUNTER — Encounter: Payer: Self-pay | Admitting: Physician Assistant

## 2022-07-07 ENCOUNTER — Ambulatory Visit: Payer: BC Managed Care – PPO | Attending: Physician Assistant | Admitting: Physician Assistant

## 2022-07-07 VITALS — BP 147/108 | HR 86 | Resp 14 | Ht 75.0 in | Wt 314.0 lb

## 2022-07-07 DIAGNOSIS — M170 Bilateral primary osteoarthritis of knee: Secondary | ICD-10-CM

## 2022-07-07 DIAGNOSIS — M5030 Other cervical disc degeneration, unspecified cervical region: Secondary | ICD-10-CM

## 2022-07-07 DIAGNOSIS — G8929 Other chronic pain: Secondary | ICD-10-CM

## 2022-07-07 DIAGNOSIS — Z9889 Other specified postprocedural states: Secondary | ICD-10-CM

## 2022-07-07 DIAGNOSIS — M1A09X0 Idiopathic chronic gout, multiple sites, without tophus (tophi): Secondary | ICD-10-CM

## 2022-07-07 DIAGNOSIS — M25512 Pain in left shoulder: Secondary | ICD-10-CM

## 2022-07-07 DIAGNOSIS — Z8679 Personal history of other diseases of the circulatory system: Secondary | ICD-10-CM

## 2022-07-07 DIAGNOSIS — Z79899 Other long term (current) drug therapy: Secondary | ICD-10-CM | POA: Diagnosis not present

## 2022-07-07 DIAGNOSIS — M797 Fibromyalgia: Secondary | ICD-10-CM

## 2022-07-07 DIAGNOSIS — Z8669 Personal history of other diseases of the nervous system and sense organs: Secondary | ICD-10-CM

## 2022-07-07 DIAGNOSIS — M7061 Trochanteric bursitis, right hip: Secondary | ICD-10-CM

## 2022-07-07 DIAGNOSIS — L4050 Arthropathic psoriasis, unspecified: Secondary | ICD-10-CM | POA: Diagnosis not present

## 2022-07-07 DIAGNOSIS — L409 Psoriasis, unspecified: Secondary | ICD-10-CM | POA: Diagnosis not present

## 2022-07-07 DIAGNOSIS — M533 Sacrococcygeal disorders, not elsewhere classified: Secondary | ICD-10-CM

## 2022-07-07 MED ORDER — GABAPENTIN 300 MG PO CAPS: 300.0000 mg | ORAL_CAPSULE | Freq: Every day | ORAL | 0 refills | Status: DC

## 2022-07-07 NOTE — Patient Instructions (Signed)
Standing Labs We placed an order today for your standing lab work.   Please have your standing labs drawn in June and every 3 months   Please have your labs drawn 2 weeks prior to your appointment so that the provider can discuss your lab results at your appointment, if possible.  Please note that you may see your imaging and lab results in MyChart before we have reviewed them. We will contact you once all results are reviewed. Please allow our office up to 72 hours to thoroughly review all of the results before contacting the office for clarification of your results.  WALK-IN LAB HOURS  Monday through Thursday from 8:00 am -12:30 pm and 1:00 pm-5:00 pm and Friday from 8:00 am-12:00 pm.  Patients with office visits requiring labs will be seen before walk-in labs.  You may encounter longer than normal wait times. Please allow additional time. Wait times may be shorter on  Monday and Thursday afternoons.  We do not book appointments for walk-in labs. We appreciate your patience and understanding with our staff.   Labs are drawn by [facility]. Please bring your co-pay at the time of your lab draw.  You may receive a bill from [facility] for your lab work.  Please note if you are on Hydroxychloroquine and and an order has been placed for a Hydroxychloroquine level,  you will need to have it drawn 4 hours or more after your last dose.  If you wish to have your labs drawn at another location, please call the office 24 hours in advance so we can fax the orders.  The office is located at [address], [address], , [place] [address]   If you have any questions regarding directions or hours of operation,  please call [phone].   As a reminder, please drink plenty of water prior to coming for your lab work. Thanks!  

## 2022-07-08 LAB — COMPLETE METABOLIC PANEL WITH GFR
AG Ratio: 2 (calc) (ref 1.0–2.5)
ALT: 40 U/L (ref 9–46)
AST: 26 U/L (ref 10–40)
Albumin: 4.9 g/dL (ref 3.6–5.1)
Alkaline phosphatase (APISO): 80 U/L (ref 36–130)
BUN: 12 mg/dL (ref 7–25)
CO2: 26 mmol/L (ref 20–32)
Calcium: 9.7 mg/dL (ref 8.6–10.3)
Chloride: 104 mmol/L (ref 98–110)
Creat: 1 mg/dL (ref 0.60–1.29)
Globulin: 2.5 g/dL (calc) (ref 1.9–3.7)
Glucose, Bld: 96 mg/dL (ref 65–99)
Potassium: 4.9 mmol/L (ref 3.5–5.3)
Sodium: 140 mmol/L (ref 135–146)
Total Bilirubin: 0.9 mg/dL (ref 0.2–1.2)
Total Protein: 7.4 g/dL (ref 6.1–8.1)
eGFR: 98 mL/min/{1.73_m2} (ref >=60)

## 2022-07-08 LAB — CBC WITH DIFFERENTIAL/PLATELET
Absolute Monocytes: 546 cells/uL (ref 200–950)
Basophils Absolute: 39 cells/uL (ref 0–200)
Basophils Relative: 0.6 %
Eosinophils Absolute: 208 cells/uL (ref 15–500)
Eosinophils Relative: 3.2 %
HCT: 49.7 % (ref 38.5–50.0)
Hemoglobin: 17.2 g/dL — ABNORMAL HIGH (ref 13.2–17.1)
Lymphs Abs: 2197 cells/uL (ref 850–3900)
MCH: 32.6 pg (ref 27.0–33.0)
MCHC: 34.6 g/dL (ref 32.0–36.0)
MCV: 94.1 fL (ref 80.0–100.0)
MPV: 9.6 fL (ref 7.5–12.5)
Monocytes Relative: 8.4 %
Neutro Abs: 3510 cells/uL (ref 1500–7800)
Neutrophils Relative %: 54 %
Platelets: 237 10*3/uL (ref 140–400)
RBC: 5.28 10*6/uL (ref 4.20–5.80)
RDW: 13.1 % (ref 11.0–15.0)
Total Lymphocyte: 33.8 %
WBC: 6.5 10*3/uL (ref 3.8–10.8)

## 2022-07-08 NOTE — Progress Notes (Signed)
CMP WNL. Hgb remains borderline low but has improved. Rest of CBC WNL.

## 2022-08-01 ENCOUNTER — Other Ambulatory Visit: Payer: Self-pay | Admitting: *Deleted

## 2022-08-01 MED ORDER — GABAPENTIN 300 MG PO CAPS: 300.0000 mg | ORAL_CAPSULE | Freq: Every day | ORAL | 0 refills | Status: DC

## 2022-08-01 NOTE — Telephone Encounter (Signed)
Last Fill: 07/07/2022  Next Visit: 12/10/2022  Last Visit: 07/07/2022  Dx: Chronic left SI joint pain:   Current Dose per office note on 07/07/2022: gabapentin 300 mg 1 capsule at bedtime.   Okay to refill Gabapentin?

## 2022-08-01 NOTE — Telephone Encounter (Signed)
From: Mittie Bodo To: Office of Gearldine Bienenstock, [place] Sent: 08/01/2022 11:35 AM EDT Subject: Medication Renewal Request  Refills have been requested for the following medications:   gabapentin (NEURONTIN) 300 MG capsule Miguel Evans]  Patient Comment: These seem to help at night please [facility] a refill to [facility] thanks   Preferred pharmacy: [id number] - [place], [place] - [address] Delivery method: [employer]

## 2022-08-11 ENCOUNTER — Other Ambulatory Visit: Payer: Self-pay | Admitting: Physician Assistant

## 2022-08-11 DIAGNOSIS — M1A09X0 Idiopathic chronic gout, multiple sites, without tophus (tophi): Secondary | ICD-10-CM

## 2022-08-11 NOTE — Telephone Encounter (Signed)
Last Fill: 05/02/2022  Labs: 07/07/2022 CMP WNL. Hgb remains borderline low but has improved. Rest of CBC WNL.   03/31/2022 uric acid: 6.8  Next Visit: 12/10/2022  Last Visit: 07/07/2022  DX: Idiopathic chronic gout of multiple sites without tophus   Current Dose per office note on 07/07/2022: allopurinol 300 mg 1 tablet by mouth daily   Okay to refill Allopurinol?

## 2022-09-12 ENCOUNTER — Other Ambulatory Visit: Payer: Self-pay | Admitting: *Deleted

## 2022-09-12 MED ORDER — GABAPENTIN 300 MG PO CAPS: 300.0000 mg | ORAL_CAPSULE | Freq: Every day | ORAL | 2 refills | Status: DC

## 2022-09-12 NOTE — Telephone Encounter (Signed)
Last Fill: 08/01/2022  Next Visit: 12/10/2022  Last Visit: 07/07/2022  Dx: Fibromyalgia   Current Dose per office note on 07/07/2022: gabapentin 300 mg 1 capsule at bedtime   Okay to refill Gabapentin?

## 2022-10-10 NOTE — Telephone Encounter (Signed)
Ok to provide paperwork to renew handicap placard

## 2022-10-21 DIAGNOSIS — I10 Essential (primary) hypertension: Secondary | ICD-10-CM | POA: Diagnosis not present

## 2022-10-21 DIAGNOSIS — Z79899 Other long term (current) drug therapy: Secondary | ICD-10-CM | POA: Diagnosis not present

## 2022-10-23 ENCOUNTER — Other Ambulatory Visit: Payer: Self-pay | Admitting: Physician Assistant

## 2022-10-23 DIAGNOSIS — Z79899 Other long term (current) drug therapy: Secondary | ICD-10-CM

## 2022-10-23 DIAGNOSIS — L4050 Arthropathic psoriasis, unspecified: Secondary | ICD-10-CM

## 2022-10-23 DIAGNOSIS — L409 Psoriasis, unspecified: Secondary | ICD-10-CM

## 2022-10-23 NOTE — Telephone Encounter (Signed)
Last Fill: 06/12/2022  Labs: 07/07/2022 CMP WNL. Hgb remains borderline low but has improved. Rest of CBC WNL.    TB Gold: 03/31/2022 Neg    Next Visit: 12/10/2022  Last Visit: 07/07/2022  DX: Psoriatic arthritis   Current Dose per office note 07/07/2022: Taltz 80 mg sq injections every 28 days   Left message to advise patient he is due to update labs.   Okay to refill Taltz?

## 2022-10-28 ENCOUNTER — Other Ambulatory Visit: Payer: Self-pay | Admitting: Physician Assistant

## 2022-10-28 DIAGNOSIS — M1A09X0 Idiopathic chronic gout, multiple sites, without tophus (tophi): Secondary | ICD-10-CM

## 2022-10-28 NOTE — Telephone Encounter (Signed)
Last Fill: 08/11/2022  Labs: 07/07/2022 CMP WNL. Hgb remains borderline low but has improved. Rest of CBC WNL.   Next Visit: 12/10/2022  Last Visit: 07/07/2022  DX: Idiopathic chronic gout of multiple sites without tophus   Current Dose per office note 07/07/2022: allopurinol 300 mg 1 tablet by mouth daily   Okay to refill Allopurinol?

## 2022-10-29 ENCOUNTER — Other Ambulatory Visit: Payer: Self-pay | Admitting: *Deleted

## 2022-10-29 DIAGNOSIS — Z79899 Other long term (current) drug therapy: Secondary | ICD-10-CM

## 2022-10-29 DIAGNOSIS — M1A09X0 Idiopathic chronic gout, multiple sites, without tophus (tophi): Secondary | ICD-10-CM | POA: Diagnosis not present

## 2022-10-29 NOTE — Progress Notes (Signed)
CBC WNL

## 2022-10-30 LAB — COMPLETE METABOLIC PANEL WITH GFR
AG Ratio: 2 (calc) (ref 1.0–2.5)
ALT: 34 U/L (ref 9–46)
AST: 24 U/L (ref 10–40)
Albumin: 4.9 g/dL (ref 3.6–5.1)
Alkaline phosphatase (APISO): 73 U/L (ref 36–130)
BUN: 11 mg/dL (ref 7–25)
CO2: 28 mmol/L (ref 20–32)
Calcium: 9.9 mg/dL (ref 8.6–10.3)
Chloride: 104 mmol/L (ref 98–110)
Creat: 1.03 mg/dL (ref 0.60–1.29)
Globulin: 2.5 g/dL (calc) (ref 1.9–3.7)
Glucose, Bld: 107 mg/dL — ABNORMAL HIGH (ref 65–99)
Potassium: 4.7 mmol/L (ref 3.5–5.3)
Sodium: 141 mmol/L (ref 135–146)
Total Bilirubin: 0.9 mg/dL (ref 0.2–1.2)
Total Protein: 7.4 g/dL (ref 6.1–8.1)
eGFR: 94 mL/min/{1.73_m2} (ref >=60)

## 2022-10-30 LAB — CBC WITH DIFFERENTIAL/PLATELET
Absolute Monocytes: 449 cells/uL (ref 200–950)
Basophils Absolute: 53 cells/uL (ref 0–200)
Basophils Relative: 0.8 %
Eosinophils Absolute: 251 cells/uL (ref 15–500)
Eosinophils Relative: 3.8 %
HCT: 48.7 % (ref 38.5–50.0)
Hemoglobin: 16.9 g/dL (ref 13.2–17.1)
Lymphs Abs: 2798 cells/uL (ref 850–3900)
MCH: 32.4 pg (ref 27.0–33.0)
MCHC: 34.7 g/dL (ref 32.0–36.0)
MCV: 93.3 fL (ref 80.0–100.0)
MPV: 9.8 fL (ref 7.5–12.5)
Monocytes Relative: 6.8 %
Neutro Abs: 3049 cells/uL (ref 1500–7800)
Neutrophils Relative %: 46.2 %
Platelets: 214 10*3/uL (ref 140–400)
RBC: 5.22 10*6/uL (ref 4.20–5.80)
RDW: 12.3 % (ref 11.0–15.0)
Total Lymphocyte: 42.4 %
WBC: 6.6 10*3/uL (ref 3.8–10.8)

## 2022-10-30 LAB — URIC ACID: Uric Acid, Serum: 6.8 mg/dL (ref 4.0–8.0)

## 2022-10-30 NOTE — Progress Notes (Signed)
Glucose is 107, rest of CMP WNL

## 2022-10-30 NOTE — Progress Notes (Signed)
Uric acid is 6.8.  Goal is to keep uric acid below 6.0.  Patient should practice low purine diet.

## 2022-11-27 NOTE — Progress Notes (Deleted)
Office Visit Note  Patient: Chirstopher Evans             Date of Birth: [birth year]           MRN: [id number]             PCP: Nonnie Done., MD Referring: Nonnie Done., MD Visit Date: 12/10/2022 Occupation: @GUAROCC @  Subjective:  No chief complaint on file.   History of Present Illness: Miguel Evans is a 42 y.o. male ***     Activities of Daily Living:  Patient reports morning stiffness for *** {minute/hour:19697}.   Patient {ACTIONS;DENIES/REPORTS:21021675::"Denies"} nocturnal pain.  Difficulty dressing/grooming: {ACTIONS;DENIES/REPORTS:21021675::"Denies"} Difficulty climbing stairs: {ACTIONS;DENIES/REPORTS:21021675::"Denies"} Difficulty getting out of chair: {ACTIONS;DENIES/REPORTS:21021675::"Denies"} Difficulty using hands for taps, buttons, cutlery, and/or writing: {ACTIONS;DENIES/REPORTS:21021675::"Denies"}  No Rheumatology ROS completed.   PMFS History:  Patient Active Problem List   Diagnosis Date Noted   Cervical disc disorder with radiculopathy 02/22/2019   Myofascial pain syndrome 02/22/2019   Tear of right rotator cuff 09/23/2018   Psoriasis 03/27/2017   Polyarthritis 02/24/2017   Patellofemoral arthralgia of both knees 02/24/2017   Hyperuricemia 03/26/2016   Elevated blood pressure  03/26/2016   Elevated LFTs 03/26/2016   Anxiety 03/26/2016   Psoriatic arthritis ([facility]) 03/25/2016   High risk medication use 03/25/2016   Cervicalgia 03/25/2016   Hand pain 03/25/2016   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    Past Medical History:  Diagnosis Date   Anxiety    Arthritis    Gout    Hypertension    Psoriasis    Psoriatic arthritis ([facility])    Sleep apnea     Family History  Problem Relation Age of Onset   Cancer Mother        breast    Bipolar disorder Mother    Hypertension Father    Osteoarthritis Father    Emphysema Maternal Grandfather    CAD Paternal Grandfather    Healthy Son    Healthy Son    Healthy Daughter    Healthy Daughter     Past Surgical History:  Procedure Laterality Date   ANTERIOR CRUCIATE LIGAMENT REPAIR Bilateral Left in 2002, Right 2013   ANTERIOR CRUCIATE LIGAMENT REPAIR Bilateral 2014   ROTATOR CUFF REPAIR Left 06/05/2020   SHOULDER ARTHROSCOPY WITH SUBACROMIAL DECOMPRESSION AND OPEN ROTATOR C Right 09/23/2018   Procedure: SHOULDER ARTHROSCOPY WITH SUBACROMIAL DECOMPRESSION AND OPEN ROTATOR CUFF REPAIR,;  Surgeon: Valeria Batman, MD;  Location: [facility];  Service: Orthopedics;  Laterality: Right;  WITH BLOCK   VASECTOMY     Social History   Social History Narrative   Right handed   4 children   One story home    There is no immunization history on file for this patient.   Objective: Vital Signs: There were no vitals taken for this visit.   Physical Exam   Musculoskeletal Exam: ***  CDAI Exam: CDAI Score: -- Patient Global: --; Provider Global: -- Swollen: --; Tender: -- Joint Exam 12/10/2022   No joint exam has been documented for this visit   There is currently no information documented on the homunculus. Go to the Rheumatology activity and complete the homunculus joint exam.  Investigation: No additional findings.  Imaging: No results found.  Recent Labs: Lab Results  Component Value Date   WBC 6.6 10/29/2022   HGB 16.9 10/29/2022   PLT 214 10/29/2022   NA 141 10/29/2022   K 4.7 10/29/2022   CL 104 10/29/2022   CO2 28 10/29/2022  GLUCOSE 107 (H) 10/29/2022   BUN 11 10/29/2022   CREATININE 1.03 10/29/2022   BILITOT 0.9 10/29/2022   ALKPHOS 74 10/24/2016   AST 24 10/29/2022   ALT 34 10/29/2022   PROT 7.4 10/29/2022   ALBUMIN 4.7 10/24/2016   CALCIUM 9.9 10/29/2022   GFRAA 103 08/31/2020   QFTBGOLDPLUS NEGATIVE 03/31/2022    Speciality Comments: Humira, Enbrel12/17-06/18, Cosyntex 06/18-09/20,Taltz 10/20 MTX -high LFTs  Procedures:  No procedures performed Allergies: Patient has no known allergies.   Assessment / Plan:     Visit Diagnoses: No  diagnosis found.  Orders: No orders of the defined types were placed in this encounter.  No orders of the defined types were placed in this encounter.   Face-to-face time spent with patient was *** minutes. Greater than 50% of time was spent in counseling and coordination of care.  Follow-Up Instructions: No follow-ups on file.   Ellen Henri, CMA  Note - This record has been created using [employer].  Chart creation errors have been sought, but may not always  have been located. Such creation errors do not reflect on  the standard of medical care.

## 2022-12-10 ENCOUNTER — Ambulatory Visit: Payer: BC Managed Care – PPO | Admitting: Rheumatology

## 2022-12-10 DIAGNOSIS — M797 Fibromyalgia: Secondary | ICD-10-CM

## 2022-12-10 DIAGNOSIS — L409 Psoriasis, unspecified: Secondary | ICD-10-CM

## 2022-12-10 DIAGNOSIS — Z8669 Personal history of other diseases of the nervous system and sense organs: Secondary | ICD-10-CM

## 2022-12-10 DIAGNOSIS — M5030 Other cervical disc degeneration, unspecified cervical region: Secondary | ICD-10-CM

## 2022-12-10 DIAGNOSIS — Z8679 Personal history of other diseases of the circulatory system: Secondary | ICD-10-CM

## 2022-12-10 DIAGNOSIS — L4050 Arthropathic psoriasis, unspecified: Secondary | ICD-10-CM

## 2022-12-10 DIAGNOSIS — Z79899 Other long term (current) drug therapy: Secondary | ICD-10-CM

## 2022-12-10 DIAGNOSIS — G8929 Other chronic pain: Secondary | ICD-10-CM

## 2022-12-10 DIAGNOSIS — Z9889 Other specified postprocedural states: Secondary | ICD-10-CM

## 2022-12-10 DIAGNOSIS — M170 Bilateral primary osteoarthritis of knee: Secondary | ICD-10-CM

## 2022-12-10 DIAGNOSIS — M1A09X0 Idiopathic chronic gout, multiple sites, without tophus (tophi): Secondary | ICD-10-CM

## 2022-12-10 DIAGNOSIS — M7061 Trochanteric bursitis, right hip: Secondary | ICD-10-CM

## 2022-12-10 NOTE — Progress Notes (Unsigned)
Office Visit Note  Patient: Miguel Evans             Date of Birth: Aug 13, 1980           MRN: [id number]             PCP: Nonnie Done., MD Referring: Nonnie Done., MD Visit Date: 12/11/2022 Occupation: @GUAROCC @  Subjective:  Medication monitoring   History of Present Illness: Miguel Evans is a 42 y.o. male with history of psoriatic arthritis and gout. He remains on Taltz 80 mg sq injections every 28 days.  He is tolerating Taltz without any side effects or injection site reactions.  Patient states that he notices an improvement in his symptoms initially after injecting Taltz but the effects waned the posterior gets to being due for his next shot.  He has been experiencing increased joint stiffness as well as pain in both hips, both knees, and his feet.  He states that he had a fall on 11/16/2022 when his right knee buckled and he landed on the left knee.  He was not evaluated by his orthopedist after the fall.  He has been soaking on some salt baths and continues to have some bruising on the left leg.  He has been taking Aleve as needed for pain relief.  He denies any signs or symptoms of a gout flare recently.  He remains on allopurinol 300 mg daily.  He has not needed to take colchicine recently.  He denies any active psoriasis at this time. He denies any recent or recurrent infections.  Activities of Daily Living:  Patient reports morning stiffness for 1.5 hours.   Patient Reports nocturnal pain.  Difficulty dressing/grooming: Denies Difficulty climbing stairs: Reports Difficulty getting out of chair: Reports Difficulty using hands for taps, buttons, cutlery, and/or writing: Reports  Review of Systems  Constitutional:  Positive for fatigue.  HENT:  Negative for mouth sores and mouth dryness.   Eyes:  Positive for dryness. Negative for pain and itching.  Respiratory:  Negative for cough, shortness of breath and wheezing.   Cardiovascular:  Positive for palpitations. Negative for  chest pain.  Gastrointestinal:  Positive for abdominal pain, blood in stool, constipation and diarrhea. Negative for nausea and vomiting.  Endocrine: Negative for increased urination.  Genitourinary:  Negative for involuntary urination.  Musculoskeletal:  Positive for joint pain, gait problem, joint pain, myalgias, morning stiffness, muscle tenderness and myalgias. Negative for joint swelling and muscle weakness.  Skin:  Positive for color change. Negative for rash, hair loss and sensitivity to sunlight.  Allergic/Immunologic: Negative for susceptible to infections.  Neurological:  Positive for headaches. Negative for dizziness.  Hematological:  Negative for swollen glands.  Psychiatric/Behavioral:  Positive for depressed mood and sleep disturbance. The patient is nervous/anxious.     PMFS History:  Patient Active Problem List   Diagnosis Date Noted   Cervical disc disorder with radiculopathy 02/22/2019   Myofascial pain syndrome 02/22/2019   Tear of right rotator cuff 09/23/2018   Psoriasis 03/27/2017   Polyarthritis 02/24/2017   Patellofemoral arthralgia of both knees 02/24/2017   Hyperuricemia 03/26/2016   Elevated blood pressure  03/26/2016   Elevated LFTs 03/26/2016   Anxiety 03/26/2016   Psoriatic arthritis (HCC) 03/25/2016   High risk medication use 03/25/2016   Cervicalgia 03/25/2016   Hand pain 03/25/2016   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    Past Medical History:  Diagnosis Date   Anxiety    Arthritis    Gout  Hypertension    Psoriasis    Psoriatic arthritis (HCC)    Sleep apnea     Family History  Problem Relation Age of Onset   Cancer Mother        breast    Bipolar disorder Mother    Hypertension Father    Osteoarthritis Father    Emphysema Maternal Grandfather    CAD Paternal Grandfather    Healthy Son    Healthy Son    Healthy Daughter    Healthy Daughter    Past Surgical History:  Procedure Laterality Date   ANTERIOR CRUCIATE LIGAMENT  REPAIR Bilateral Left in 2002, Right 2013   ANTERIOR CRUCIATE LIGAMENT REPAIR Bilateral 2014   ROTATOR CUFF REPAIR Left 06/05/2020   SHOULDER ARTHROSCOPY WITH SUBACROMIAL DECOMPRESSION AND OPEN ROTATOR C Right 09/23/2018   Procedure: SHOULDER ARTHROSCOPY WITH SUBACROMIAL DECOMPRESSION AND OPEN ROTATOR CUFF REPAIR,;  Surgeon: Valeria Batman, MD;  Location: [facility];  Service: Orthopedics;  Laterality: Right;  WITH BLOCK   VASECTOMY     Social History   Social History Narrative   Right handed   4 children   One story home    There is no immunization history on file for this patient.   Objective: Vital Signs: BP (!) 144/99 (BP Location: Left Arm, Patient Position: Sitting, Cuff Size: Large)   Pulse 72   Resp 16   Ht 6\' 3"  (1.905 m)   Wt (!) 318 lb 6.4 oz (144.4 kg)   BMI 39.80 kg/m    Physical Exam Vitals and nursing note reviewed.  Constitutional:      Appearance: He is well-developed.  HENT:     Head: Normocephalic and atraumatic.  Eyes:     Conjunctiva/sclera: Conjunctivae normal.     Pupils: Pupils are equal, round, and reactive to light.  Cardiovascular:     Rate and Rhythm: Normal rate and regular rhythm.     Heart sounds: Normal heart sounds.  Pulmonary:     Effort: Pulmonary effort is normal.     Breath sounds: Normal breath sounds.  Abdominal:     General: Bowel sounds are normal.     Palpations: Abdomen is soft.  Musculoskeletal:     Cervical back: Normal range of motion and neck supple.  Skin:    General: Skin is warm and dry.     Capillary Refill: Capillary refill takes less than 2 seconds.  Neurological:     Mental Status: He is alert and oriented to person, place, and time.  Psychiatric:        Behavior: Behavior normal.      Musculoskeletal Exam: C-spine has painful range of motion.  Midline spinal tenderness in any cervical region.  Mild tenderness over the left SI joint.  Painful range of motion about shoulder joints with crepitus in the right  shoulder.  Elbow joints have good range of motion.  Limited extension of the right wrist with tenderness.  Tenderness over the right first, second, and third MCP joints.  Slightly limited range of motion in the right hip with discomfort.  Tenderness over bilateral trochanteric bursa.  Knee joints have painful range of motion.  No warmth or effusion noted in the knees.  Hematoma noted on the medial aspect of the left knee.  Fading ecchymosis noted on the lateral aspect of his left thigh.  Ankle joints have good range of motion with some tenderness bilaterally.  No evidence of Achilles tendinitis or plantar fasciitis at this time.  CDAI Exam:  CDAI Score: -- Patient Global: --; Provider Global: -- Swollen: --; Tender: -- Joint Exam 12/11/2022   No joint exam has been documented for this visit   There is currently no information documented on the homunculus. Go to the Rheumatology activity and complete the homunculus joint exam.  Investigation: No additional findings.  Imaging: No results found.  Recent Labs: Lab Results  Component Value Date   WBC 6.6 10/29/2022   HGB 16.9 10/29/2022   PLT 214 10/29/2022   NA 141 10/29/2022   K 4.7 10/29/2022   CL 104 10/29/2022   CO2 28 10/29/2022   GLUCOSE 107 (H) 10/29/2022   BUN 11 10/29/2022   CREATININE 1.03 10/29/2022   BILITOT 0.9 10/29/2022   ALKPHOS 74 10/24/2016   AST 24 10/29/2022   ALT 34 10/29/2022   PROT 7.4 10/29/2022   ALBUMIN 4.7 10/24/2016   CALCIUM 9.9 10/29/2022   GFRAA 103 08/31/2020   QFTBGOLDPLUS NEGATIVE 03/31/2022    Speciality Comments: Humira, Enbrel12/17-06/18, Cosyntex 06/18-09/20,Taltz 10/20 MTX -high LFTs  Procedures:  No procedures performed Allergies: Patient has no known allergies.    Assessment / Plan:     Visit Diagnoses: Psoriatic arthritis [facility]): Patient presents today with ongoing pain and stiffness involving multiple joints.  His stiffness has been most severe in both shoulders, both hands, both  knees, and both feet.  On examination he has limited extension of the right wrist and tenderness over the right first, second, and third MCP joints.  At times he has difficulty ambulating due to severity of pain and stiffness in his knees, hips, and feet.  He remains on Taltz 80 mg subcu as injections every 28 days.  He has not missed any doses recently.  Patient states that after initially injecting Taltz he notices a significant improvement in his joint stiffness but the effects waned after 2 weeks.  Patient would like to remain on Taltz but is open to discuss other treatment options.  He is apprehensive to restart methotrexate due to the concern for chronic kidney disease.  Reviewed lab work from 10/29/2022 at which time his creatinine and GFR within normal limits and LFTs were within normal limits.  Discussed the option of initiating a trial of Arava for 3 months.  Indications, contraindications, potential side effects of Arava were discussed today in detail and all questions were addressed.  Consent obtained today.  Plan to start the patient on Arava 10 mg daily x 2 weeks and if labs are stable we will increase to 20 mg daily.  He will require lab work in 2 weeks x 2 then every 3 months.  He was advised to notify us if he cannot tolerate taking Arava.  He will remain on Taltz as combination therapy.  He will follow-up in the office in 2 to 3 months to assess his response.  Medication counseling:   Baseline Immunosuppressant Therapy Labs     Latest Ref Rng & Units 03/31/2022    1:47 PM  Quantiferon TB Gold  Quantiferon TB Gold Plus NEGATIVE NEGATIVE        Latest Ref Rng & Units 10/29/2022    8:18 AM  Serum Protein Electrophoresis  Total Protein 6.1 - 8.1 g/dL 7.4    Patient was counseled on the purpose, proper use, and adverse effects of leflunomide including risk of infection, nausea/diarrhea/weight loss, increase in blood pressure, rash, hair loss, tingling in the hands and feet, and signs  and symptoms of interstitial lung disease.   Also counseled  on Black Box warning of liver injury and importance of avoiding alcohol while on therapy. Discussed that there is the possibility of an increased risk of malignancy but it is not well understood if this increased risk is due to the medication or the disease state.  Counseled patient to avoid live vaccines. Recommend annual influenza, Pneumovax 23, Prevnar 13, and Shingrix as indicated.   Discussed the importance of frequent monitoring of liver function and blood count.  Standing orders placed.  Discussed importance of birth control while on leflunomide due to risk of congenital abnormalities, and patient confirms he has had a vasectomy.  Provided patient with educational materials on leflunomide and answered all questions.  Patient consented to [place] use, and consent will be uploaded into the media tab.   Patient dose will be 10 mg daily x2 weeks and if labs are stable he will increase to 20 mg daily.  Prescription pending lab results and/or insurance approval.  High risk medication use - Taltz 80 mg sq injections every 28 days. Plan to initiate a trial of Arava 10 mg daily x2 weeks and if labs are stable increase to 20 mg daily.  He will require lab work in 2 weeks x 2 then every 3 months.   Methotrexate-d/c due to elevated LFTs.  CBC and CMP updated on 10/29/22. He will return to update lab work in 2 weeks x2 then every 3 months.   TB gold negative on 03/31/22 Hx of vasectomy.  Discussed the importance of holding taltz if he develops signs or symptoms of an infection and to resume once the infection has completely cleared.   Psoriasis: No active psoriasis at this time.   Idiopathic chronic gout of multiple sites without tophus - He has not had any signs or symptoms of a gout flare. He is taking allopurinol 300 mg 1 tablet by mouth daily and colchicine 0.6 mg as needed during flares.  He continues to tolerate allopurinol without any side  effects.  He has not needed to take colchicine recently. Uric acid was 6.8 on 10/29/22.   S/P right rotator cuff repair: Crepitus noted with ROM.   Chronic left shoulder pain: Good ROM with some stiffness and discomfort.   Primary osteoarthritis of both knees: Chronic pain.  He has been experiencing some instability in his right knee and had an episode of buckling leading to a fall on 11/16/2022.  On examination he has painful range of motion of both knee joints.  No warmth or effusion noted.  Hematoma was noted on the medial aspect of his left knee. Discussed proceeding with visco-supplementation for both knees if approved by insurance.   Chronic left SI joint pain: Mild tenderness of the left SI joint.   Trochanteric bursitis, right hip - Injection performed on 03/31/22.  He has tenderness of the right trochanteric bursa today.  Encouraged stretching exercises daily.   DDD (degenerative disc disease), cervical: C-spine has painful ROM.  Midline spinal tenderness in the C-spine.   Fibromyalgia -He remains on Cymbalta 60 mg 1 capsule by mouth daily. He has noticed an improvement in his nocturnal pain since initiating Gabapentin 300 mg at bedtime after his last office visit.  A refill of gabapentin will be sent to the pharmacy today.   History of hypertension: BP was slightly elevated today in the office and was rechecked prior to leaving.   History of sleep apnea  Orders: No orders of the defined types were placed in this encounter.  Meds ordered this  encounter  Medications   leflunomide (ARAVA) 10 MG tablet    Sig: Take one tab by mouth once daily for 2 weeks. Repeat labs. If stable, increase to 2 tabs once daily    Dispense:  166 tablet    Refill:  0    Follow-Up Instructions: Return in about 3 months (around 03/13/2023) for Psoriatic arthritis, Gout, Fibromyalgia.   Gearldine Bienenstock, PA-C  Note - This record has been created using Dragon software.  Chart creation errors have been  sought, but may not always  have been located. Such creation errors do not reflect on  the standard of medical care.

## 2022-12-11 ENCOUNTER — Encounter: Payer: Self-pay | Admitting: Physician Assistant

## 2022-12-11 ENCOUNTER — Other Ambulatory Visit: Payer: Self-pay

## 2022-12-11 ENCOUNTER — Ambulatory Visit: Payer: BC Managed Care – PPO | Attending: Rheumatology | Admitting: Physician Assistant

## 2022-12-11 VITALS — BP 144/99 | HR 72 | Resp 16 | Ht 75.0 in | Wt 318.4 lb

## 2022-12-11 DIAGNOSIS — Z79899 Other long term (current) drug therapy: Secondary | ICD-10-CM

## 2022-12-11 DIAGNOSIS — G8929 Other chronic pain: Secondary | ICD-10-CM

## 2022-12-11 DIAGNOSIS — Z8679 Personal history of other diseases of the circulatory system: Secondary | ICD-10-CM

## 2022-12-11 DIAGNOSIS — M7061 Trochanteric bursitis, right hip: Secondary | ICD-10-CM

## 2022-12-11 DIAGNOSIS — L4050 Arthropathic psoriasis, unspecified: Secondary | ICD-10-CM

## 2022-12-11 DIAGNOSIS — Z9889 Other specified postprocedural states: Secondary | ICD-10-CM

## 2022-12-11 DIAGNOSIS — M25512 Pain in left shoulder: Secondary | ICD-10-CM

## 2022-12-11 DIAGNOSIS — M170 Bilateral primary osteoarthritis of knee: Secondary | ICD-10-CM

## 2022-12-11 DIAGNOSIS — M797 Fibromyalgia: Secondary | ICD-10-CM

## 2022-12-11 DIAGNOSIS — M533 Sacrococcygeal disorders, not elsewhere classified: Secondary | ICD-10-CM

## 2022-12-11 DIAGNOSIS — M1A09X0 Idiopathic chronic gout, multiple sites, without tophus (tophi): Secondary | ICD-10-CM | POA: Diagnosis not present

## 2022-12-11 DIAGNOSIS — L409 Psoriasis, unspecified: Secondary | ICD-10-CM | POA: Diagnosis not present

## 2022-12-11 DIAGNOSIS — Z8669 Personal history of other diseases of the nervous system and sense organs: Secondary | ICD-10-CM

## 2022-12-11 DIAGNOSIS — M5030 Other cervical disc degeneration, unspecified cervical region: Secondary | ICD-10-CM

## 2022-12-11 MED ORDER — GABAPENTIN 300 MG PO CAPS: 300.0000 mg | ORAL_CAPSULE | Freq: Every day | ORAL | 0 refills | Status: DC

## 2022-12-11 MED ORDER — LEFLUNOMIDE 10 MG PO TABS
ORAL_TABLET | ORAL | 0 refills | Status: DC
Start: 2022-12-11 — End: 2023-03-16

## 2022-12-11 NOTE — Progress Notes (Signed)
Pharmacy Note  Subjective: Patient presents today to the [facility] for follow up office visit.  Patient seen by the pharmacist for counseling on leflunomide Ranae Plumber) for psoriatic arthritis and plaque psoriasis. Currently takes Taltz 80mg  SQ every 4 weeks  Previously treated with Humira, Enbrel, Cosentyx with waning response. MTX caused some GI side effects and elevated LFTs  Has baseline HTN  Objective: CBC    Component Value Date/Time   WBC 6.6 10/29/2022 0818   RBC 5.22 10/29/2022 0818   HGB 16.9 10/29/2022 0818   HCT 48.7 10/29/2022 0818   PLT 214 10/29/2022 0818   MCV 93.3 10/29/2022 0818   MCH 32.4 10/29/2022 0818   MCHC 34.7 10/29/2022 0818   RDW 12.3 10/29/2022 0818   LYMPHSABS 2,798 10/29/2022 0818   MONOABS 0.5 09/22/2018 1422   EOSABS 251 10/29/2022 0818   BASOSABS 53 10/29/2022 0818    CMP     Component Value Date/Time   NA 141 10/29/2022 0818   K 4.7 10/29/2022 0818   CL 104 10/29/2022 0818   CO2 28 10/29/2022 0818   GLUCOSE 107 (H) 10/29/2022 0818   BUN 11 10/29/2022 0818   CREATININE 1.03 10/29/2022 0818   CALCIUM 9.9 10/29/2022 0818   PROT 7.4 10/29/2022 0818   ALBUMIN 4.7 10/24/2016 1052   AST 24 10/29/2022 0818   ALT 34 10/29/2022 0818   ALKPHOS 74 10/24/2016 1052   BILITOT 0.9 10/29/2022 0818   GFRNONAA 89 08/31/2020 1407   GFRAA 103 08/31/2020 1407    Baseline Immunosuppressant Therapy Labs    Latest Ref Rng & Units 03/31/2022    1:47 PM  Quantiferon TB Gold  Quantiferon TB Gold Plus NEGATIVE NEGATIVE    Hepatitis panel: negative 10/10/2015   HIV: negative 10/10/2015   Immunoglobulins: normal 10/10/2015   SPEP: normal 10/10/2015     Latest Ref Rng & Units 10/29/2022    8:18 AM  Serum Protein Electrophoresis  Total Protein 6.1 - 8.1 g/dL 7.4     No results found for: "G6PDH"  No results found for: "TPMT"   Pregnancy status:  vasectomy  Assessment/Plan:  Patient was counseled on the purpose, proper use, and  adverse effects of leflunomide including risk of infection, nausea/diarrhea/weight loss, increase in blood pressure, rash, hair loss, tingling in the hands and feet, and signs and symptoms of interstitial lung disease.  Patient has HTN and has been advised to continue to monitor BP at home three times weekly and notify us if any changes to blood pressure   Also counseled on Black Box warning of liver injury and importance of avoiding alcohol while on therapy. Discussed that there is the possibility of an increased risk of malignancy but it is not well understood if this increased risk is due to the medication or the disease state.  Counseled patient to avoid live vaccines. Recommend annual influenza, Pneumovax 23, Prevnar 13, and Shingrix as indicated.   Discussed the importance of frequent monitoring of liver function and blood count.  Standing orders placed.  Discussed importance of birth control while on leflunomide due to risk of congenital abnormalities, and patient confirms he has had vasectomy.  Provided patient with educational materials on leflunomide and answered all questions.  Patient consented to [place] use, and consent will be uploaded into the media tab.    Patient dose will be leflunomide 10mg  daily x 2 weeks. Repeat labs. If stable, increase to 20mg  daily.  Prescription pending lab results and/or insurance approval.   He  will continue Taltz 80mg  SQ every 4 weeks  Chesley Mires, PharmD, MPH, BCPS, CPP Clinical Pharmacist (Rheumatology and Pulmonology)

## 2022-12-11 NOTE — Telephone Encounter (Signed)
Please review and sign pended gabapentin refill. Thanks!

## 2022-12-11 NOTE — Patient Instructions (Addendum)
Standing Labs We placed an order today for your standing lab work.   Please have your standing labs drawn in 2 weeks x2 then every 3 months   Please have your labs drawn 2 weeks prior to your appointment so that the provider can discuss your lab results at your appointment, if possible.  Please note that you may see your imaging and lab results in MyChart before we have reviewed them. We will contact you once all results are reviewed. Please allow our office up to 72 hours to thoroughly review all of the results before contacting the office for clarification of your results. Leflunomide Tablets What is this medication? LEFLUNOMIDE (le FLOO na mide) treats the symptoms of rheumatoid arthritis. It works by slowing down an overactive immune system. This decreases inflammation. It belongs to a group of medications called DMARDs. This medicine may be used for other purposes; ask your health care provider or pharmacist if you have questions. COMMON BRAND NAME(S): Arava What should I tell my care team before I take this medication? They need to know if you have any of these conditions: Cancer Diabetes High blood pressure Immune system problems Infection Kidney disease Liver disease Low blood cell levels (white cells, red cells, and platelets) Lung or breathing disease, such as asthma or COPD Recent or upcoming vaccine Skin conditions Tingling of the fingers or toes, or other nerve disorder An unusual or allergic reaction to leflunomide, other medications, food, dyes, or preservatives Pregnant or trying to get pregnant Breastfeeding How should I use this medication? Take this medication by mouth with a full glass of water. Take it as directed on the prescription label at the same time every day. Keep taking it unless your care team tells you to stop. Talk to your care team about the use of this medication in children. Special care may be needed. Overdosage: If you think you have taken too  much of this medicine contact a poison control center or emergency room at once. NOTE: This medicine is only for you. Do not share this medicine with others. What if I miss a dose? If you miss a dose, take it as soon as you can. If it is almost time for your next dose, take only that dose. Do not take double or extra doses. What may interact with this medication? Do not take this medication with any of the following: Teriflunomide This medication may also interact with the following: Alosetron Caffeine Cefaclor Certain medications for diabetes, such as nateglinide, repaglinide, rosiglitazone, pioglitazone Certain medications for high cholesterol, such as atorvastatin, pravastatin, rosuvastatin, simvastatin Charcoal Cholestyramine Ciprofloxacin Duloxetine Estrogen and progestin hormones Furosemide Ketoprofen Live virus vaccines Medications that increase your risk for infection Methotrexate Mitoxantrone Paclitaxel Penicillin Theophylline Tizanidine Warfarin This list may not describe all possible interactions. Give your health care provider a list of all the medicines, herbs, non-prescription drugs, or dietary supplements you use. Also tell them if you smoke, drink alcohol, or use illegal drugs. Some items may interact with your medicine. What should I watch for while using this medication? Visit your care team for regular checks on your progress. Tell your care team if your symptoms do not start to get better or if they get worse. You may need blood work done while you are taking this medication. This medication may cause serious skin reactions. They can happen weeks to months after starting the medication. Contact your care team right away if you notice fevers or flu-like symptoms with a rash. The rash  may be red or purple and then turn into blisters or peeling of the skin. You may also notice a red rash with swelling of the face, lips, or lymph nodes in your neck or under your  arms. You should not receive certain vaccines during your treatment and for a certain time after your treatment with this medication ends. Talk to your care team for more information. This medication may stay in your body for up to 2 years after your last dose. Tell your care team about any unusual side effects or symptoms. A medication can be given to help lower your blood levels of this medication more quickly. Talk to your care team if you may be pregnant. This medication can cause serious birth defects if taken during pregnancy and for a while after the last dose. You will need a negative pregnancy test before starting this medication. Contraception is recommended while taking this medication and for a while after the last dose. Your care team can help you find the option that works for you. Do not breastfeed while taking this medication. What side effects may I notice from receiving this medication? Side effects that you should report to your care team as soon as possible: Allergic reactions--skin rash, itching, hives, swelling of the face, lips, tongue, or throat Dry cough, shortness of breath or trouble breathing Increase in blood pressure Infection--fever, chills, cough, sore throat, wounds that don't heal, pain or trouble when passing urine, general feeling of discomfort or being unwell Redness, blistering, peeling, or loosening of the skin, including inside the mouth Liver injury--right upper belly pain, loss of appetite, nausea, light-colored stool, dark yellow or brown urine, yellowing skin or eyes, unusual weakness or fatigue Pain, tingling, or numbness in the hands or feet Unusual bruising or bleeding Side effects that usually do not require medical attention (report to your care team if they continue or are bothersome): Back pain Diarrhea Hair loss Headache Nausea This list may not describe all possible side effects. Call your doctor for medical advice about side effects. You may  report side effects to FDA at 1-800-FDA-1088. Where should I keep my medication? Keep out of the reach of children and pets. Store at room temperature between 20 and 25 degrees C (68 and 77 degrees F). Protect from moisture and light. Keep the container tightly closed. Get rid of any unused medication after the expiration date. To get rid of medications that are no longer needed or have expired: Take the medication to a medication take-back program. Check with your pharmacy or law enforcement to find a location. If you cannot return the medication, ask your pharmacist or care team how to get rid of this medication safely. NOTE: This sheet is a summary. It may not cover all possible information. If you have questions about this medicine, talk to your doctor, pharmacist, or health care provider.  2024 Elsevier/Gold Standard (2021-08-29 00:00:00)  WALK-IN LAB HOURS  Monday through Thursday from 8:00 am -12:30 pm and 1:00 pm-5:00 pm and Friday from 8:00 am-12:00 pm.  Patients with office visits requiring labs will be seen before walk-in labs.  You may encounter longer than normal wait times. Please allow additional time. Wait times may be shorter on  Monday and Thursday afternoons.  We do not book appointments for walk-in labs. We appreciate your patience and understanding with our staff.   Labs are drawn by [facility]. Please bring your co-pay at the time of your lab draw.  You may receive a bill  from [facility] for your lab work.  Please note if you are on Hydroxychloroquine and and an order has been placed for a Hydroxychloroquine level,  you will need to have it drawn 4 hours or more after your last dose.  If you wish to have your labs drawn at another location, please call the office 24 hours in advance so we can fax the orders.  The office is located at [address], [address], [place], [place] [address]   If you have any questions regarding directions or hours of operation,  please call  [phone].   As a reminder, please drink plenty of water prior to coming for your lab work. Thanks

## 2022-12-25 ENCOUNTER — Other Ambulatory Visit: Payer: Self-pay | Admitting: *Deleted

## 2022-12-25 DIAGNOSIS — Z79899 Other long term (current) drug therapy: Secondary | ICD-10-CM

## 2022-12-25 NOTE — Progress Notes (Signed)
Hemoglobin and hematocrit are borderline elevated.  Rest of CBC WNL.  We will continue to monitor.

## 2022-12-26 LAB — CBC WITH DIFFERENTIAL/PLATELET
Absolute Monocytes: 578 {cells}/uL (ref 200–950)
Basophils Absolute: 38 {cells}/uL (ref 0–200)
Basophils Relative: 0.7 %
Eosinophils Absolute: 238 {cells}/uL (ref 15–500)
Eosinophils Relative: 4.4 %
HCT: 51.7 % — ABNORMAL HIGH (ref 38.5–50.0)
Hemoglobin: 17.5 g/dL — ABNORMAL HIGH (ref 13.2–17.1)
Lymphs Abs: 2279 {cells}/uL (ref 850–3900)
MCH: 32.4 pg (ref 27.0–33.0)
MCHC: 33.8 g/dL (ref 32.0–36.0)
MCV: 95.7 fL (ref 80.0–100.0)
MPV: 10 fL (ref 7.5–12.5)
Monocytes Relative: 10.7 %
Neutro Abs: 2268 {cells}/uL (ref 1500–7800)
Neutrophils Relative %: 42 %
Platelets: 232 10*3/uL (ref 140–400)
RBC: 5.4 10*6/uL (ref 4.20–5.80)
RDW: 12.9 % (ref 11.0–15.0)
Total Lymphocyte: 42.2 %
WBC: 5.4 10*3/uL (ref 3.8–10.8)

## 2022-12-26 LAB — COMPLETE METABOLIC PANEL WITH GFR
AG Ratio: 1.8 (calc) (ref 1.0–2.5)
ALT: 41 U/L (ref 9–46)
AST: 27 U/L (ref 10–40)
Albumin: 4.8 g/dL (ref 3.6–5.1)
Alkaline phosphatase (APISO): 80 U/L (ref 36–130)
BUN: 11 mg/dL (ref 7–25)
CO2: 27 mmol/L (ref 20–32)
Calcium: 9.5 mg/dL (ref 8.6–10.3)
Chloride: 105 mmol/L (ref 98–110)
Creat: 1.03 mg/dL (ref 0.60–1.29)
Globulin: 2.7 g/dL (ref 1.9–3.7)
Glucose, Bld: 102 mg/dL — ABNORMAL HIGH (ref 65–99)
Potassium: 4.9 mmol/L (ref 3.5–5.3)
Sodium: 140 mmol/L (ref 135–146)
Total Bilirubin: 0.7 mg/dL (ref 0.2–1.2)
Total Protein: 7.5 g/dL (ref 6.1–8.1)
eGFR: 94 mL/min/{1.73_m2} (ref >=60)

## 2022-12-26 NOTE — Progress Notes (Signed)
CMP WNL

## 2023-01-01 MED ORDER — LEFLUNOMIDE 20 MG PO TABS: 20.0000 mg | ORAL_TABLET | Freq: Every day | ORAL | 0 refills | Status: DC

## 2023-01-01 NOTE — Telephone Encounter (Signed)
Labs: 12/25/2022 CMP WNL Hemoglobin and hematocrit are borderline elevated. Rest of CBC WNL.   Next Visit: 03/16/2023  Last Visit: 12/11/2022  DX: Psoriatic arthritis   Current Dose per office note 12/11/2022:  leflunomide 10mg  daily x 2 weeks. Repeat labs. If stable, increase to 20mg  daily.   Patient has increased to Arava 20 mg po daily and insurance will not cover 10 mg tablets. Requesting 20 mg tablets.   Okay to refill Arava ?

## 2023-01-12 ENCOUNTER — Other Ambulatory Visit: Payer: Self-pay | Admitting: Physician Assistant

## 2023-01-12 DIAGNOSIS — L4050 Arthropathic psoriasis, unspecified: Secondary | ICD-10-CM

## 2023-01-12 DIAGNOSIS — Z79899 Other long term (current) drug therapy: Secondary | ICD-10-CM

## 2023-01-12 DIAGNOSIS — L409 Psoriasis, unspecified: Secondary | ICD-10-CM

## 2023-01-12 NOTE — Telephone Encounter (Signed)
Last Fill: 10/23/2022  Labs: 12/25/2022 Hemoglobin and hematocrit are borderline elevated. Rest of CBC WNL. CMP WNL   TB Gold: 03/31/2022 Neg    Next Visit: 03/16/2023  Last Visit: 12/11/2022  DX: Psoriatic arthritis    Current Dose per office note 12/11/2022: Taltz 80 mg sq injections every 28 days.   Okay to refill Taltz?

## 2023-01-15 ENCOUNTER — Other Ambulatory Visit: Payer: Self-pay | Admitting: *Deleted

## 2023-01-15 DIAGNOSIS — Z79899 Other long term (current) drug therapy: Secondary | ICD-10-CM

## 2023-01-16 ENCOUNTER — Other Ambulatory Visit: Payer: Self-pay | Admitting: Physician Assistant

## 2023-01-16 DIAGNOSIS — L409 Psoriasis, unspecified: Secondary | ICD-10-CM

## 2023-01-16 DIAGNOSIS — L4050 Arthropathic psoriasis, unspecified: Secondary | ICD-10-CM

## 2023-01-16 DIAGNOSIS — Z79899 Other long term (current) drug therapy: Secondary | ICD-10-CM

## 2023-01-16 NOTE — Progress Notes (Signed)
CMP WNL Hemoglobin and hematocrit are elevated and continue to trend up.   Please see if EPO and ferritin can be added.  Please clarify if he has been severely dehydrated (diarrhea/vomiting) or smoking?

## 2023-01-19 LAB — CBC WITH DIFFERENTIAL/PLATELET
Absolute Monocytes: 549 {cells}/uL (ref 200–950)
Basophils Absolute: 39 {cells}/uL (ref 0–200)
Basophils Relative: 0.7 %
Eosinophils Absolute: 258 {cells}/uL (ref 15–500)
Eosinophils Relative: 4.6 %
HCT: 54.8 % — ABNORMAL HIGH (ref 38.5–50.0)
Hemoglobin: 18.1 g/dL — ABNORMAL HIGH (ref 13.2–17.1)
Lymphs Abs: 2223 {cells}/uL (ref 850–3900)
MCH: 32 pg (ref 27.0–33.0)
MCHC: 33 g/dL (ref 32.0–36.0)
MCV: 96.8 fL (ref 80.0–100.0)
MPV: 10.6 fL (ref 7.5–12.5)
Monocytes Relative: 9.8 %
Neutro Abs: 2531 {cells}/uL (ref 1500–7800)
Neutrophils Relative %: 45.2 %
Platelets: 192 10*3/uL (ref 140–400)
RBC: 5.66 10*6/uL (ref 4.20–5.80)
RDW: 12.4 % (ref 11.0–15.0)
Total Lymphocyte: 39.7 %
WBC: 5.6 10*3/uL (ref 3.8–10.8)

## 2023-01-19 LAB — COMPLETE METABOLIC PANEL WITH GFR
AG Ratio: 2 (calc) (ref 1.0–2.5)
ALT: 34 U/L (ref 9–46)
AST: 19 U/L (ref 10–40)
Albumin: 4.8 g/dL (ref 3.6–5.1)
Alkaline phosphatase (APISO): 84 U/L (ref 36–130)
BUN: 11 mg/dL (ref 7–25)
CO2: 26 mmol/L (ref 20–32)
Calcium: 9.5 mg/dL (ref 8.6–10.3)
Chloride: 106 mmol/L (ref 98–110)
Creat: 1.1 mg/dL (ref 0.60–1.29)
Globulin: 2.4 g/dL (ref 1.9–3.7)
Glucose, Bld: 100 mg/dL — ABNORMAL HIGH (ref 65–99)
Potassium: 5.2 mmol/L (ref 3.5–5.3)
Sodium: 141 mmol/L (ref 135–146)
Total Bilirubin: 0.5 mg/dL (ref 0.2–1.2)
Total Protein: 7.2 g/dL (ref 6.1–8.1)
eGFR: 86 mL/min/{1.73_m2} (ref >=60)

## 2023-01-19 LAB — TEST AUTHORIZATION

## 2023-01-19 LAB — FERRITIN: Ferritin: 187 ng/mL (ref 38–380)

## 2023-01-19 LAB — ERYTHROPOIETIN: Erythropoietin: 9.2 m[IU]/mL (ref 2.6–18.5)

## 2023-01-19 NOTE — Progress Notes (Signed)
Ferritin and erythropoietin WNL.  We will continue to monitor elevation in hemoglobin and hematocrit.

## 2023-01-20 ENCOUNTER — Other Ambulatory Visit: Payer: Self-pay | Admitting: Physician Assistant

## 2023-01-20 DIAGNOSIS — M1A09X0 Idiopathic chronic gout, multiple sites, without tophus (tophi): Secondary | ICD-10-CM

## 2023-01-20 DIAGNOSIS — Z79899 Other long term (current) drug therapy: Secondary | ICD-10-CM | POA: Diagnosis not present

## 2023-01-20 DIAGNOSIS — I10 Essential (primary) hypertension: Secondary | ICD-10-CM | POA: Diagnosis not present

## 2023-01-20 DIAGNOSIS — E7801 Familial hypercholesterolemia: Secondary | ICD-10-CM | POA: Diagnosis not present

## 2023-01-20 NOTE — Telephone Encounter (Signed)
Last Fill: 10/28/2022  Labs: 01/15/2023 CMP WNL Hemoglobin and hematocrit are elevated and continue to trend up.  Ferritin and erythropoietin WNL.   10/29/2022 Uric acid is 6.8.   Next Visit: 03/16/2023  Last Visit: 12/11/2022  DX: Idiopathic chronic gout of multiple sites without tophus   Current Dose per office note 12/11/2022: allopurinol 300 mg 1 tablet by mouth daily   Okay to refill Allopurinol?

## 2023-01-21 ENCOUNTER — Telehealth: Payer: Self-pay | Admitting: *Deleted

## 2023-01-21 NOTE — Telephone Encounter (Signed)
Call received from [facility] stating they need a prior authorization for Taltz.

## 2023-01-26 ENCOUNTER — Other Ambulatory Visit: Payer: Self-pay

## 2023-01-26 ENCOUNTER — Other Ambulatory Visit (HOSPITAL_COMMUNITY): Payer: Self-pay

## 2023-01-26 NOTE — Progress Notes (Signed)
Received a fax stating that "[employer] does not process prior authorizations for this health plan. Please submit your request directly to the patient's Health Plan for processing". At this point it is unclear if this insurance is no longer active or if [employer] has incorrectly processed this request as if it were through medical benefits instead.  I have reached out to the patient for additional information regarding his current insurance plan as our eligibility checks yield no results and the most recent card we have on file is from 10/2021. Will await response from pt before proceeding.

## 2023-01-26 NOTE — Progress Notes (Signed)
Submitted an URGENT Prior Authorization request to [facility] for TALTZ via CoverMyMeds. Will update once we receive a response.  Key: Deatra [id number]

## 2023-01-29 ENCOUNTER — Other Ambulatory Visit (HOSPITAL_COMMUNITY): Payer: Self-pay

## 2023-01-29 ENCOUNTER — Encounter: Payer: Self-pay | Admitting: Pharmacist

## 2023-01-29 DIAGNOSIS — L4050 Arthropathic psoriasis, unspecified: Secondary | ICD-10-CM

## 2023-01-29 DIAGNOSIS — L409 Psoriasis, unspecified: Secondary | ICD-10-CM

## 2023-01-29 DIAGNOSIS — Z79899 Other long term (current) drug therapy: Secondary | ICD-10-CM

## 2023-01-29 NOTE — Progress Notes (Signed)
Received notification from [facility] regarding a prior authorization for TALTZ. Authorization has been APPROVED from 01/29/2023 to 01/28/2024. Approval letter sent to scan center.  Unable to run test claim because [facility] is not contracted with [employer]  Patient can fill through [facility]: [phone]  Authorization # [id number] Phone # [phone]  Approval letter faxed to [facility]  Chesley Mires, PharmD, MPH, BCPS, CPP Clinical Pharmacist (Rheumatology and Pulmonology)

## 2023-01-29 NOTE — Progress Notes (Signed)
Submitted an URGENT Prior Authorization request to [employer] for TALTZ via CoverMyMeds. Will update once we receive a response.  Key:  [id number]  [employer] does not manage PA for this patient. Please contact the number on the back of the members card for further assistance  Sbumitted on [employer] Prom[employer] portal EOC ID: [id number] . Chesley Mires, PharmD, MPH, BCPS, CPP Clinical Pharmacist (Rheumatology and Pulmonology)

## 2023-02-11 MED ORDER — TALTZ 80 MG/ML ~~LOC~~ SOAJ
80.0000 mg | SUBCUTANEOUS | 2 refills | Status: DC
Start: 2023-02-11 — End: 2023-04-14

## 2023-02-11 NOTE — Progress Notes (Addendum)
Called [facility] for update on Taltz rx. Per rep, patient has two pharmacy accounts. Pharmacy rep stating they never received prescription on 01/12/2023. Provided verbal rx to [facility], Levy Pupa, PharmD, MPH, BCPS, CPP Clinical Pharmacist (Rheumatology and Pulmonology)

## 2023-02-20 ENCOUNTER — Other Ambulatory Visit: Payer: Self-pay

## 2023-03-02 NOTE — Progress Notes (Unsigned)
Office Visit Note  Patient: Miguel Evans             Date of Birth: 06-07-1980           MRN: [id number]             PCP: Nonnie Done., MD Referring: Nonnie Done., MD Visit Date: 03/16/2023 Occupation: @GUAROCC @  Subjective:  Paresthesias both upper extremities and both feet   History of Present Illness: Miguel Evans is a 42 y.o. male with history of psoriatic arthritis and gout.  Patient remains on taltz 80 mg sq injections every 28 days and arava 20 mg 1 tablet by mouth daily.  He has been tolerating Taltz without any side effects or injection site reactions.  Patient states that he has not noticed any clinical benefit while taking Arava.  He states that since beginning October he has been going to the gym on a regular basis including riding a stationary bike for 30 minutes at a time.  He has lost about 25 pounds.  Patient states that he has had paresthesias involving both upper extremities and his feet.  He has been taking gabapentin 300 mg at bedtime which has been helping him sleep at night but he continues to have paresthesias and upper extremity weakness throughout the day.  He is not currently followed by neurology.  Patient denies any joint swelling at this time.  He denies any psoriasis at this time.  He continues to have chronic pain in the left knee as well as on the lateral aspect of his right hip.  He experiences intermittent discomfort in his lower back which he attributes to gait changes favoring his left knee.   Activities of Daily Living:  Patient reports morning stiffness for 1-1.5 hours.   Patient Reports nocturnal pain.  Difficulty dressing/grooming: Reports Difficulty climbing stairs: Reports Difficulty getting out of chair: Reports Difficulty using hands for taps, buttons, cutlery, and/or writing: Reports  Review of Systems  Constitutional:  Positive for fatigue.  HENT:  Negative for mouth sores and mouth dryness.   Eyes:  Positive for visual disturbance and  dryness. Negative for pain.  Respiratory:  Negative for shortness of breath.   Cardiovascular:  Negative for chest pain and palpitations.  Gastrointestinal:  Positive for constipation and diarrhea. Negative for blood in stool.  Endocrine: Negative for increased urination.  Genitourinary:  Negative for involuntary urination.  Musculoskeletal:  Positive for joint pain, gait problem, joint pain, myalgias, muscle weakness, morning stiffness, muscle tenderness and myalgias. Negative for joint swelling.  Skin:  Positive for color change. Negative for rash, hair loss and sensitivity to sunlight.  Allergic/Immunologic: Negative for susceptible to infections.  Neurological:  Positive for parasthesias. Negative for dizziness and headaches.  Hematological:  Positive for swollen glands.  Psychiatric/Behavioral:  Positive for depressed mood and sleep disturbance. The patient is nervous/anxious.     PMFS History:  Patient Active Problem List   Diagnosis Date Noted   Cervical disc disorder with radiculopathy 02/22/2019   Myofascial pain syndrome 02/22/2019   Tear of right rotator cuff 09/23/2018   Psoriasis 03/27/2017   Polyarthritis 02/24/2017   Patellofemoral arthralgia of both knees 02/24/2017   Hyperuricemia 03/26/2016   Elevated blood pressure  03/26/2016   Elevated LFTs 03/26/2016   Anxiety 03/26/2016   Psoriatic arthritis (HCC) 03/25/2016   High risk medication use 03/25/2016   Cervicalgia 03/25/2016   Hand pain 03/25/2016   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    Past Medical  History:  Diagnosis Date   Anxiety    Arthritis    Gout    Hypertension    Psoriasis    Psoriatic arthritis (HCC)    Sleep apnea     Family History  Problem Relation Age of Onset   Cancer Mother        breast    Bipolar disorder Mother    Hypertension Father    Osteoarthritis Father    Emphysema Maternal Grandfather    CAD Paternal Grandfather    Healthy Son    Healthy Son    Healthy Daughter     Healthy Daughter    Past Surgical History:  Procedure Laterality Date   ANTERIOR CRUCIATE LIGAMENT REPAIR Bilateral Left in 2002, Right 2013   ANTERIOR CRUCIATE LIGAMENT REPAIR Bilateral 2014   ROTATOR CUFF REPAIR Left 06/05/2020   SHOULDER ARTHROSCOPY WITH SUBACROMIAL DECOMPRESSION AND OPEN ROTATOR C Right 09/23/2018   Procedure: SHOULDER ARTHROSCOPY WITH SUBACROMIAL DECOMPRESSION AND OPEN ROTATOR CUFF REPAIR,;  Surgeon: Valeria Batman, MD;  Location: [facility];  Service: Orthopedics;  Laterality: Right;  WITH BLOCK   VASECTOMY     Social History   Social History Narrative   Right handed   4 children   One story home    There is no immunization history on file for this patient.   Objective: Vital Signs: BP 127/86 (BP Location: Left Arm, Patient Position: Sitting, Cuff Size: Large)   Pulse 72   Resp 18   Ht 6\' 3"  (1.905 m)   Wt 298 lb 3.2 oz (135.3 kg)   BMI 37.27 kg/m    Physical Exam Vitals and nursing note reviewed.  Constitutional:      Appearance: He is well-developed.  HENT:     Head: Normocephalic and atraumatic.  Eyes:     Conjunctiva/sclera: Conjunctivae normal.     Pupils: Pupils are equal, round, and reactive to light.  Cardiovascular:     Rate and Rhythm: Normal rate and regular rhythm.     Heart sounds: Normal heart sounds.  Pulmonary:     Effort: Pulmonary effort is normal.     Breath sounds: Normal breath sounds.  Abdominal:     General: Bowel sounds are normal.     Palpations: Abdomen is soft.  Musculoskeletal:     Cervical back: Normal range of motion and neck supple.  Skin:    General: Skin is warm and dry.     Capillary Refill: Capillary refill takes less than 2 seconds.  Neurological:     Mental Status: He is alert and oriented to person, place, and time.  Psychiatric:        Behavior: Behavior normal.      Musculoskeletal Exam: C-spine has limited range of motion.  Discomfort with range of motion of both shoulders especially the left  shoulder.  Elbow joints, wrist joints, MCPs, PIPs, DIPs have good range of motion with no synovitis.  Tenderness of the 1st through 3rd MCP and PIP joints bilaterally.  Hip joints have limited range of motion.  Tenderness over the right trochanteric bursa.  Painful range of motion of both knees especially the left knee with full extension.  No tenderness or swelling of ankle joints.  No evidence of Achilles tendinitis or plantar fasciitis.  CDAI Exam: CDAI Score: -- Patient Global: --; Provider Global: -- Swollen: --; Tender: -- Joint Exam 03/16/2023   No joint exam has been documented for this visit   There is currently no information documented on  the homunculus. Go to the Rheumatology activity and complete the homunculus joint exam.  Investigation: No additional findings.  Imaging: No results found.  Recent Labs: Lab Results  Component Value Date   WBC 5.6 01/15/2023   HGB 18.1 (H) 01/15/2023   PLT 192 01/15/2023   NA 141 01/15/2023   K 5.2 01/15/2023   CL 106 01/15/2023   CO2 26 01/15/2023   GLUCOSE 100 (H) 01/15/2023   BUN 11 01/15/2023   CREATININE 1.10 01/15/2023   BILITOT 0.5 01/15/2023   ALKPHOS 74 10/24/2016   AST 19 01/15/2023   ALT 34 01/15/2023   PROT 7.2 01/15/2023   ALBUMIN 4.7 10/24/2016   CALCIUM 9.5 01/15/2023   GFRAA 103 08/31/2020   QFTBGOLDPLUS NEGATIVE 03/31/2022    Speciality Comments: Humira, Enbrel12/17-06/18, Cosyntex 06/18-09/20,Taltz 10/20 MTX -high LFTs  Procedures:  No procedures performed Allergies: Patient has no known allergies.     Assessment / Plan:     Visit Diagnoses: Psoriatic arthritis (HCC): No synovitis or dactylitis noted.  Patient remains on Taltz 80 mg subcu injections every 28 days and has been taking Arava 20 mg 1 tablet daily.  He has not noticed any clinical benefit in regards to his joint pain since initiating Arava.  Plan to discontinue Arava at this time.  No inflammation was noted on examination today.  No active  psoriasis.  No evidence of Achilles tendinitis or plantar fasciitis.  Since the beginning of October he has been able to go back to the gym and has been exercising on a regular basis.  He has lost 25 pounds but continues to have persistent pain involving multiple joints.  He has been experiencing paresthesias in both upper extremities as well as in both feet.  Plan to place a referral to neurology for further evaluation.  He is currently taking gabapentin 300 mg at bedtime.  Plan to send in a prescription for gabapentin 100 mg 1 capsule in the morning to try to alleviate his current symptoms until he is able to see neurology. Patient remain on Taltz as monotherapy for management of psoriatic arthritis and psoriasis.  He will notify us if he develops any new or worsening symptoms after discontinuing Arava. He will follow-up in the office in 3 months or sooner if needed.  Psoriasis: No active psoriasis at this time.   High risk medication use - Taltz 80 mg sq injections every 28 days.  Hx of vasectomy.  Discontinue arava-no clinical benefit.  CBC and CMP updated on 01/15/23.  Orders for CBC and CMP released today.  His next lab work will be due in March and every 3 months. TB gold negative on 03/31/22. Order for TB gold released today.  No recent or recurrent infections. Discussed the importance of holding taltz and arava if he develops signs or symptoms of an infection and to resume once the infection has completely cleared.   - Plan: QuantiFERON-TB Gold Plus, CBC with Differential/Platelet, COMPLETE METABOLIC PANEL WITH GFR  Screening for tuberculosis -Order for TB gold released today.  Plan: QuantiFERON-TB Gold Plus  Idiopathic chronic gout of multiple sites without tophus -He has not had any signs or symptoms of a gout flare.  He has clinically been doing well taking allopurinol 300 mg 1 tablet by mouth daily and colchicine 0.6 mg as needed during flares.  S/P right rotator cuff repair: He  experiences intermittent discomfort and stiffness.  He has returned to the gym and has started weight lifting again but has been  keeping the weight low.   Chronic left shoulder pain: Chronic pain and stiffness.   Primary osteoarthritis of both knees: Chronic pain, left knee greater than right.  He experiences significant discomfort in the left knee especially while bearing weight.  He has difficulty climbing steps and rising from a seated position at times.  He has returned to the gym and has lost 25 pounds but continues to have persistent discomfort.  Once his blood pressure is better controlled he can return for a left knee cortisone injection in the future.   Chronic left SI joint pain: Chronic pain.  Trochanteric bursitis, right hip - Injection performed on 03/31/22.  His symptoms have recurred.  DDD (degenerative disc disease), cervical: C-spine has limited range of motion without rotation.  He is been experiencing pain and paresthesias involving both upper extremities.  Patient previously an MRI of the cervical spine on 01/28/2019 which revealed degenerative change throughout the cervical and upper thoracic region.  Bilateral foraminal narrowing at C6-C7 noted. He underwent NCV with EMG on 04/06/2019--left median neuropathy at or distal to the wrist consistent with carpal tunnel syndrome noted.  No evidence of cervical radiculopathy affecting the left upper extremity was noted at that time. He has been taking gabapentin 300 mg at bedtime.  Plan to send in a prescription for gabapentin 100 mg in the morning. A referral to neurology will also be placed for further evaluation.   Fibromyalgia - He experiences intermittent myalgias and muscle tenderness.  He remains on Cymbalta 60 mg 1 capsule by mouth daily. He has noticed an improvement in his nocturnal pain since initiating Gabapentin 300 mg at bedtime.  He has been experiencing increased paresthesias in upper extremities and both-plan to refer  the patient to neurology for further evaluation.  A prescription for gabapentin 100 mg 1 capsule in the morning was sent to the pharmacy today to minimize his current symptoms.   Paresthesia and pain of both upper extremities: Patient has been experiencing paresthesias involving both upper extremities for the past 6-8 weeks.  No recent injury.  He feels that the pain has been radiating from his cervical spine.  He has noticed difficulty with fine motor as well as grip strength.  Reviewed MRI results of the cervical spine from October 2020 as well as NCV with EMG results from December 2020.  A referral to neurology was placed today for further evaluation.   Paresthesia of both lower extremities: He has been experiencing numbness and tingling involving bilateral 2nd-4th toes.  A referral to neurology was placed today for further evaluation.  Other medical conditions are listed as follows:   History of hypertension: Blood pressure was 127/86 today in the office.  History of sleep apnea  Orders: Orders Placed This Encounter  Procedures   QuantiFERON-TB Gold Plus   CBC with Differential/Platelet   COMPLETE METABOLIC PANEL WITH GFR   No orders of the defined types were placed in this encounter.   Follow-Up Instructions: Return in about 3 months (around 06/14/2023) for Psoriatic arthritis, Gout.   Gearldine Bienenstock, PA-C  Note - This record has been created using Dragon software.  Chart creation errors have been sought, but may not always  have been located. Such creation errors do not reflect on  the standard of medical care.

## 2023-03-16 ENCOUNTER — Ambulatory Visit: Payer: BC Managed Care – PPO | Attending: Physician Assistant | Admitting: Physician Assistant

## 2023-03-16 ENCOUNTER — Encounter: Payer: Self-pay | Admitting: Physician Assistant

## 2023-03-16 VITALS — BP 127/86 | HR 72 | Resp 18 | Ht 75.0 in | Wt 298.2 lb

## 2023-03-16 DIAGNOSIS — Z9889 Other specified postprocedural states: Secondary | ICD-10-CM

## 2023-03-16 DIAGNOSIS — Z8679 Personal history of other diseases of the circulatory system: Secondary | ICD-10-CM

## 2023-03-16 DIAGNOSIS — M25512 Pain in left shoulder: Secondary | ICD-10-CM

## 2023-03-16 DIAGNOSIS — M7061 Trochanteric bursitis, right hip: Secondary | ICD-10-CM

## 2023-03-16 DIAGNOSIS — Z8669 Personal history of other diseases of the nervous system and sense organs: Secondary | ICD-10-CM

## 2023-03-16 DIAGNOSIS — L4050 Arthropathic psoriasis, unspecified: Secondary | ICD-10-CM

## 2023-03-16 DIAGNOSIS — M797 Fibromyalgia: Secondary | ICD-10-CM

## 2023-03-16 DIAGNOSIS — M1A09X0 Idiopathic chronic gout, multiple sites, without tophus (tophi): Secondary | ICD-10-CM | POA: Diagnosis not present

## 2023-03-16 DIAGNOSIS — Z111 Encounter for screening for respiratory tuberculosis: Secondary | ICD-10-CM

## 2023-03-16 DIAGNOSIS — G8929 Other chronic pain: Secondary | ICD-10-CM

## 2023-03-16 DIAGNOSIS — M79602 Pain in left arm: Secondary | ICD-10-CM

## 2023-03-16 DIAGNOSIS — M533 Sacrococcygeal disorders, not elsewhere classified: Secondary | ICD-10-CM

## 2023-03-16 DIAGNOSIS — L409 Psoriasis, unspecified: Secondary | ICD-10-CM

## 2023-03-16 DIAGNOSIS — Z79899 Other long term (current) drug therapy: Secondary | ICD-10-CM | POA: Diagnosis not present

## 2023-03-16 DIAGNOSIS — M5030 Other cervical disc degeneration, unspecified cervical region: Secondary | ICD-10-CM

## 2023-03-16 DIAGNOSIS — M170 Bilateral primary osteoarthritis of knee: Secondary | ICD-10-CM

## 2023-03-16 DIAGNOSIS — R202 Paresthesia of skin: Secondary | ICD-10-CM

## 2023-03-16 DIAGNOSIS — M79601 Pain in right arm: Secondary | ICD-10-CM

## 2023-03-16 MED ORDER — GABAPENTIN 100 MG PO CAPS: 100.0000 mg | ORAL_CAPSULE | ORAL | 0 refills | Status: DC

## 2023-03-16 NOTE — Patient Instructions (Signed)
Standing Labs We placed an order today for your standing lab work.   Please have your standing labs drawn in march and every 3 months   Please have your labs drawn 2 weeks prior to your appointment so that the provider can discuss your lab results at your appointment, if possible.  Please note that you may see your imaging and lab results in MyChart before we have reviewed them. We will contact you once all results are reviewed. Please allow our office up to 72 hours to thoroughly review all of the results before contacting the office for clarification of your results.  WALK-IN LAB HOURS  Monday through Thursday from 8:00 am -12:30 pm and 1:00 pm-5:00 pm and Friday from 8:00 am-12:00 pm.  Patients with office visits requiring labs will be seen before walk-in labs.  You may encounter longer than normal wait times. Please allow additional time. Wait times may be shorter on  Monday and Thursday afternoons.  We do not book appointments for walk-in labs. We appreciate your patience and understanding with our staff.   Labs are drawn by [facility]. Please bring your co-pay at the time of your lab draw.  You may receive a bill from [facility] for your lab work.  Please note if you are on Hydroxychloroquine and and an order has been placed for a Hydroxychloroquine level,  you will need to have it drawn 4 hours or more after your last dose.  If you wish to have your labs drawn at another location, please call the office 24 hours in advance so we can fax the orders.  The office is located at [address], [address], [place], [place] [address]   If you have any questions regarding directions or hours of operation,  please call [phone].   As a reminder, please drink plenty of water prior to coming for your lab work. Thanks!

## 2023-03-17 ENCOUNTER — Encounter: Payer: Self-pay | Admitting: Neurology

## 2023-03-17 NOTE — Progress Notes (Signed)
CBC and CMP WNL

## 2023-03-18 LAB — QUANTIFERON-TB GOLD PLUS
Mitogen-NIL: >10 [IU]/mL
NIL: 0.01 [IU]/mL
QuantiFERON-TB Gold Plus: NEGATIVE
TB1-NIL: 0 [IU]/mL
TB2-NIL: 0 [IU]/mL

## 2023-03-18 LAB — COMPLETE METABOLIC PANEL WITH GFR
AG Ratio: 2 (calc) (ref 1.0–2.5)
ALT: 28 U/L (ref 9–46)
AST: 23 U/L (ref 10–40)
Albumin: 4.8 g/dL (ref 3.6–5.1)
Alkaline phosphatase (APISO): 77 U/L (ref 36–130)
BUN: 14 mg/dL (ref 7–25)
CO2: 28 mmol/L (ref 20–32)
Calcium: 9.7 mg/dL (ref 8.6–10.3)
Chloride: 106 mmol/L (ref 98–110)
Creat: 1.15 mg/dL (ref 0.60–1.29)
Globulin: 2.4 g/dL (ref 1.9–3.7)
Glucose, Bld: 92 mg/dL (ref 65–99)
Potassium: 5 mmol/L (ref 3.5–5.3)
Sodium: 145 mmol/L (ref 135–146)
Total Bilirubin: 1 mg/dL (ref 0.2–1.2)
Total Protein: 7.2 g/dL (ref 6.1–8.1)
eGFR: 82 mL/min/{1.73_m2} (ref >=60)

## 2023-03-18 LAB — CBC WITH DIFFERENTIAL/PLATELET
Absolute Lymphocytes: 2224 {cells}/uL (ref 850–3900)
Absolute Monocytes: 611 {cells}/uL (ref 200–950)
Basophils Absolute: 38 {cells}/uL (ref 0–200)
Basophils Relative: 0.6 %
Eosinophils Absolute: 189 {cells}/uL (ref 15–500)
Eosinophils Relative: 3 %
HCT: 48.2 % (ref 38.5–50.0)
Hemoglobin: 16.2 g/dL (ref 13.2–17.1)
MCH: 31 pg (ref 27.0–33.0)
MCHC: 33.6 g/dL (ref 32.0–36.0)
MCV: 92.3 fL (ref 80.0–100.0)
MPV: 10.4 fL (ref 7.5–12.5)
Monocytes Relative: 9.7 %
Neutro Abs: 3238 {cells}/uL (ref 1500–7800)
Neutrophils Relative %: 51.4 %
Platelets: 204 10*3/uL (ref 140–400)
RBC: 5.22 10*6/uL (ref 4.20–5.80)
RDW: 12.4 % (ref 11.0–15.0)
Total Lymphocyte: 35.3 %
WBC: 6.3 10*3/uL (ref 3.8–10.8)

## 2023-03-18 NOTE — Progress Notes (Signed)
TB gold negative

## 2023-03-22 ENCOUNTER — Other Ambulatory Visit: Payer: Self-pay | Admitting: Physician Assistant

## 2023-03-23 NOTE — Telephone Encounter (Signed)
Last Fill: 12/11/2022  Next Visit: 06/15/2023  Last Visit: 03/16/2023  Dx: Fibromyalgia   Current Dose per office note on 03/16/2023: Gabapentin 300 mg at bedtime   Okay to refill Gabapentin?

## 2023-03-28 ENCOUNTER — Other Ambulatory Visit: Payer: Self-pay | Admitting: Physician Assistant

## 2023-04-14 ENCOUNTER — Other Ambulatory Visit: Payer: Self-pay | Admitting: Physician Assistant

## 2023-04-14 DIAGNOSIS — L409 Psoriasis, unspecified: Secondary | ICD-10-CM

## 2023-04-14 DIAGNOSIS — L4050 Arthropathic psoriasis, unspecified: Secondary | ICD-10-CM

## 2023-04-14 DIAGNOSIS — Z79899 Other long term (current) drug therapy: Secondary | ICD-10-CM

## 2023-04-14 NOTE — Telephone Encounter (Signed)
 Last Fill: 02/11/2023  Labs: 03/16/2023 CBC and CMP WNL   TB Gold: 03/16/2023 Neg    Next Visit: 06/15/2023  Last Visit: 03/16/2023  IK:Ednmpjupr arthritis   Current Dose per office note 03/16/2023: Taltz  80 mg sq injections every 28 days.   Okay to refill Taltz ?

## 2023-04-28 ENCOUNTER — Ambulatory Visit: Payer: BC Managed Care – PPO | Admitting: Neurology

## 2023-04-28 ENCOUNTER — Encounter: Payer: Self-pay | Admitting: Neurology

## 2023-04-28 VITALS — BP 114/72 | HR 84 | Ht 75.0 in | Wt 292.0 lb

## 2023-04-28 DIAGNOSIS — R202 Paresthesia of skin: Secondary | ICD-10-CM | POA: Diagnosis not present

## 2023-04-28 NOTE — Telephone Encounter (Signed)
Ok to provide permanent handicap placard.

## 2023-04-28 NOTE — Progress Notes (Signed)
 [facility] Neurology Division Clinic Note - Initial Visit   Date: 04/28/2023   Miguel Evans MRN: [id number] DOB: [birth year]   Dear Miguel Craze, PA-C:  Thank you for your kind referral of Miguel Evans for consultation of paresthesias. Although his history is well known to you, please allow us  to reiterate it for the purpose of our medical record. The patient was accompanied to the clinic by self.    Miguel Evans is a 43 y.o. right-handed male with psoriatic arthritis, fibromyalgia, hypertension, and gout presenting for evaluation of diffuse paresthesias.   IMPRESSION/PLAN: Generalized paresthesias of bilateral arms and feet.  Exam shows intact vibration throughout, absent pin prick throughout the left leg, intact reflexes and strength throughout.  Prior testing shows mild left CTS and MRI cervical spine from 2020 shows possible bilateral C7 radiculopathy.  He has received ESI in the past with transient benefit.  Symptoms are not typical for neuropathy.  To further evaluate his symptoms, I recommend NCS/EMG of left arm and leg.  Consider, MRI cervical spine going forward.   Further recommendations pending results.   ------------------------------------------------------------- History of present illness: He was seen in 2020 for left arm paresthesias.  EDX showed left carpal tunnel syndrome (mild).  Early 2024, his numbness/tingling started getting worse and now involves both arm (from the shoulder into the hands).  He has occasional heaviness, worse on the left. He has vibration sensation in both hands.  Arm symptoms are intermittent and variable.  Activity makes his symptoms worse.  He complains of neck pain.  He previously did neck PT about 2 years ago, with no benefit.  He takes gabapentin  100mg  in the morning and 300mg  at bedtime.    He has burning and stinging pain involving the middle three toes, which is constant.  He has low back pain and weakness in both legs.  He has fallen  several times last year.   He does not work.  He last working in March 2023.  Nonsmoker.  He drinks alcohol 12-pack beer over the weekend.     Out-side paper records, electronic medical record, and images have been reviewed where available and summarized as:  MRI cervical spine wo contrast 01/28/2019: C6-7: Spondylosis with endplate osteophytes and bulging of the disc. Narrowing of the ventral subarachnoid space but no compression of the cord. AP diameter of the canal in the midline 8 mm. Bilateral foraminal encroachment that could affect either C7 nerve.   C7-T1: Normal interspace.   T1-2 and T2-3: Noncompressive disc bulges.   IMPRESSION: Degenerative changes throughout the cervical and upper thoracic region as outlined above. No compressive central canal stenosis. Bilateral foraminal narrowing at C6-7 that would have some potential to affect either C7 nerve. Lesser foraminal narrowing on the left at C4-5 due to uncovertebral encroachment could possibly affect the left C5 nerve.  NCS/EMG of the left arm 04/06/2019 Left median neuropathy at or distal to the wrist (mild), consistent with a clinical diagnosis of carpal tunnel syndrome.   Incidentally, there is a right Martin-Gruber anastomoses, a normal anatomic variant. There is no evidence of an ulnar neuropathy or cervical radiculopathy affecting the left upper extremity  Lab Results  Component Value Date   ESRSEDRATE 2 09/24/2021    Past Medical History:  Diagnosis Date   Anxiety    Arthritis    Gout    Hypertension    Psoriasis    Psoriatic arthritis (HCC)    Sleep apnea     Past Surgical History:  Procedure  Laterality Date   ANTERIOR CRUCIATE LIGAMENT REPAIR Bilateral Left in 2002, Right 2013   ANTERIOR CRUCIATE LIGAMENT REPAIR Bilateral 2014   ROTATOR CUFF REPAIR Left 06/05/2020   SHOULDER ARTHROSCOPY WITH SUBACROMIAL DECOMPRESSION AND OPEN ROTATOR C Right 09/23/2018   Procedure: SHOULDER ARTHROSCOPY WITH  SUBACROMIAL DECOMPRESSION AND OPEN ROTATOR CUFF REPAIR,;  Surgeon: Anderson Maude ORN, MD;  Location: [facility];  Service: Orthopedics;  Laterality: Right;  WITH BLOCK   VASECTOMY       Medications:  Outpatient Encounter Medications as of 04/28/2023  Medication Sig   allopurinol  (ZYLOPRIM ) 300 MG tablet TAKE 1 TABLET DAILY   ALPRAZolam (XANAX) 0.5 MG tablet Take 0.5 mg by mouth 3 (three) times daily as needed.   amLODipine (NORVASC) 5 MG tablet Take 5 mg by mouth daily.   colchicine  0.6 MG tablet Take 1 tablet (0.6 mg total) by mouth daily. (Patient taking differently: Take 0.6 mg by mouth as needed.)   COLLAGEN PO Take by mouth.   DULoxetine (CYMBALTA) 60 MG capsule Take 60 mg by mouth daily.   gabapentin  (NEURONTIN ) 100 MG capsule Take 1 capsule (100 mg total) by mouth every morning.   gabapentin  (NEURONTIN ) 300 MG capsule TAKE 1 CAPSULE AT BEDTIME   glucosamine-chondroitin 500-400 MG tablet Take 1 tablet by mouth daily.   metoprolol tartrate (LOPRESSOR) 25 MG tablet Take 25 mg by mouth 2 (two) times daily.   Multiple Vitamin (MULTIVITAMIN) capsule Take 1 capsule by mouth daily.   Omega-3 Fatty Acids (FISH OIL) 1000 MG CAPS Take 1,000 mg by mouth daily.    TALTZ  80 MG/ML pen INJECT 80 MG (1 PEN) UNDER THE SKIN EVERY 4 WEEKS   DULoxetine (CYMBALTA) 30 MG capsule Take 30 mg by mouth 2 (two) times daily.  (Patient not taking: Reported on 04/28/2023)   lisinopril (PRINIVIL,ZESTRIL) 20 MG tablet Take 20 mg by mouth daily.    predniSONE  (DELTASONE ) 5 MG tablet Take 4 tabs po x 4 days, 3  tabs po x 4 days, 2  tabs po x 4 days, 1  tab po x 4 days (Patient not taking: Reported on 04/28/2023)   testosterone  cypionate (DEPOTESTOSTERONE CYPIONATE) 200 MG/ML injection Inject into the muscle every 14 (fourteen) days. (Patient not taking: Reported on 04/28/2023)   No facility-administered encounter medications on file as of 04/28/2023.    Allergies: No Known Allergies  Family History: Family History   Problem Relation Age of Onset   Cancer Mother        breast    Bipolar disorder Mother    Hypertension Father    Osteoarthritis Father    Emphysema Maternal Grandfather    CAD Paternal Grandfather    Healthy Son    Healthy Son    Healthy Daughter    Healthy Daughter     Social History: Social History   Tobacco Use   Smoking status: Former    Current packs/day: 0.00    Average packs/day: 0.3 packs/day for 5.0 years (1.3 ttl pk-yrs)    Types: Cigarettes    Start date: 03/28/2007    Quit date: 03/27/2012    Years since quitting: 11.0    Passive exposure: Past   Smokeless tobacco: Former  Building Services Engineer status: Never Used  Substance Use Topics   Alcohol use: Not Currently   Drug use: No   Social History   Social History Narrative   Right handed   4 children   One story home  Are you right handed or left handed? Right Handed   Are you currently employed ? No   What is your current occupation?   Do you live at home alone? No    Who lives with you? Spouse and two children    What type of home do you live in: 1 story or 2 story? Lives in a one story home        Vital Signs:  BP 114/72   Pulse 84   Ht 6' 3 (1.905 m)   Wt 292 lb (132.5 kg)   SpO2 95%   BMI 36.50 kg/m    Neurological Exam: MENTAL STATUS including orientation to time, place, person, recent and remote memory, attention span and concentration, language, and fund of knowledge is normal.  Speech is not dysarthric.  CRANIAL NERVES: II:  No visual field defects.     III-IV-VI: Pupils equal round and reactive to light.  Normal conjugate, extra-ocular eye movements in all directions of gaze.  No nystagmus.  No ptosis.   V:  Normal facial sensation.    VII:  Normal facial symmetry and movements.   VIII:  Normal hearing and vestibular function.   IX-X:  Normal palatal movement.   XI:  Normal shoulder shrug and head rotation.   XII:  Normal tongue strength and range of motion, no  deviation or fasciculation.  MOTOR:  No atrophy, fasciculations or abnormal movements.  No pronator drift.   Upper Extremity:  Right  Left  Deltoid  5/5   5/5   Biceps  5/5   5/5   Triceps  5/5   5/5   Finger extensors  5/5   5/5   Finger flexors  5/5   5/5   Dorsal interossei  5/5   5/5   Abductor pollicis  5/5   5/5   Tone (Ashworth scale)  0  0   Lower Extremity:  Right  Left  Hip flexors  5/5   5/5   Knee flexors  5/5   5/5   Knee extensors  5/5   5/5   Dorsiflexors  5/5   5/5   Plantarflexors  5/5   5/5   Toe extensors  5/5   5/5   Toe flexors  5/5   5/5   Tone (Ashworth scale)  0  0   MSRs:                                           Right        Left brachioradialis 2+  2+  biceps 2+  2+  triceps 2+  2+  patellar 2+  2+  ankle jerk 2+  2+  Hoffman no  no  plantar response down  down   SENSORY:  Absent pin prick in the left lower leg and foot.  Pin prick intact in the hands and right leg.  Vibration intact throughout.  Absent temperature in the feet bilaterally.   Romberg's sign absent.   COORDINATION/GAIT: Normal finger-to- nose-finger.  Intact rapid alternating movements bilaterally.Gait narrow based and stable. Tandem and stressed gait intact.     Thank you for allowing me to participate in patient's care.  If I can answer any additional questions, I would be pleased to do so.    Sincerely,    Javell Blackburn K. Tobie, DO

## 2023-04-28 NOTE — Patient Instructions (Signed)
ELECTROMYOGRAM AND NERVE CONDUCTION STUDIES (EMG/NCS) INSTRUCTIONS  How to Prepare The neurologist conducting the EMG will need to know if you have certain medical conditions. Tell the neurologist and other EMG lab personnel if you: . Have a pacemaker or any other electrical medical device . Take blood-thinning medications . Have hemophilia, a blood-clotting disorder that causes prolonged bleeding Bathing Take a shower or bath shortly before your exam in order to remove oils from your skin. Don't apply lotions or creams before the exam.  What to Expect You'll likely be asked to change into a hospital gown for the procedure and lie down on an examination table. The following explanations can help you understand what will happen during the exam.  . Electrodes. The neurologist or a technician places surface electrodes at various locations on your skin depending on where you're experiencing symptoms. Or the neurologist may insert needle electrodes at different sites depending on your symptoms.  . Sensations. The electrodes will at times transmit a tiny electrical current that you may feel as a twinge or spasm. The needle electrode may cause discomfort or pain that usually ends shortly after the needle is removed. If you are concerned about discomfort or pain, you may want to talk to the neurologist about taking a short break during the exam.  . Instructions. During the needle EMG, the neurologist will assess whether there is any spontaneous electrical activity when the muscle is at rest - activity that isn't present in healthy muscle tissue - and the degree of activity when you slightly contract the muscle.  He or she will give you instructions on resting and contracting a muscle at appropriate times. Depending on what muscles and nerves the neurologist is examining, he or she may ask you to change positions during the exam.  After your EMG You may experience some temporary, minor bruising where the  needle electrode was inserted into your muscle. This bruising should fade within several days. If it persists, contact your primary care doctor.    

## 2023-05-11 ENCOUNTER — Other Ambulatory Visit: Payer: Self-pay | Admitting: Physician Assistant

## 2023-05-11 DIAGNOSIS — Z79899 Other long term (current) drug therapy: Secondary | ICD-10-CM | POA: Diagnosis not present

## 2023-05-11 DIAGNOSIS — E7801 Familial hypercholesterolemia: Secondary | ICD-10-CM | POA: Diagnosis not present

## 2023-05-11 DIAGNOSIS — I10 Essential (primary) hypertension: Secondary | ICD-10-CM | POA: Diagnosis not present

## 2023-05-11 DIAGNOSIS — M1A09X0 Idiopathic chronic gout, multiple sites, without tophus (tophi): Secondary | ICD-10-CM

## 2023-05-11 DIAGNOSIS — L4050 Arthropathic psoriasis, unspecified: Secondary | ICD-10-CM | POA: Diagnosis not present

## 2023-05-11 NOTE — Telephone Encounter (Signed)
Last Fill: 01/20/2023  Labs: 03/16/2023  CBC and CMP WNL 10/29/2022 Uric acid is 6.8.   Next Visit: 06/15/2023  Last Visit: 03/16/2023  DX: Idiopathic chronic gout of multiple sites without tophus   Current Dose per office note 03/16/2023: allopurinol 300 mg 1 tablet by mouth daily   Okay to refill Allopurinol?

## 2023-05-22 ENCOUNTER — Ambulatory Visit: Payer: BC Managed Care – PPO | Admitting: Neurology

## 2023-05-22 DIAGNOSIS — R202 Paresthesia of skin: Secondary | ICD-10-CM | POA: Diagnosis not present

## 2023-05-22 DIAGNOSIS — G5602 Carpal tunnel syndrome, left upper limb: Secondary | ICD-10-CM

## 2023-05-22 NOTE — Procedures (Signed)
 [facility] Neurology  [address], [address]  [place], [place] [address] Tel: [phone] Fax: [phone] Test Date:  05/22/2023  Patient: Miguel Evans DOB: [birth year] Physician: Tonita Blanch, DO  Sex: Male Height: 6' 3 Ref Phys: Tonita Blanch, DO  ID#: [id number]   Technician:    History: This is a 43 year old man referred for evaluation of generalized paresthesias of the hands and feet.  NCV & EMG Findings: Extensive electrodiagnostic testing of the left upper and lower extremity shows:  Left median sensory response shows prolonged latency (3.9 ms) and reduced amplitude (16.8 V).  Left ulnar, sural, and superficial peroneal sensory responses are within normal limits.   Left median, ulnar, peroneal, and tibial motor responses are within normal limits.   Left tibial H reflex study is within normal limits, when adjusted for patient's height.   There is no evidence of active or chronic motor axonal loss changes affecting any of the tested muscles.  Motor unit configuration and recruitment pattern is within normal limits.    Impression: Left median neuropathy at or distal to the wrist, consistent with a clinical diagnosis of carpal tunnel syndrome.  Overall, these findings are moderate in degree electrically and has progressed compared to prior study on 04/06/2019.  In particular, there is no evidence of a cervical/lumbosacral radiculopathy or sensorimotor polyneuropathy affecting the left side.   ___________________________ Tonita Blanch, DO    Nerve Conduction Studies   Stim Site NR Peak (ms) Norm Peak (ms) O-P Amp (V) Norm O-P Amp  Left Median Anti Sensory (2nd Digit)  32 C  Wrist    *3.9 <3.4 *16.8 >20  Left Sup Peroneal Anti Sensory (Ant Lat Mall)  32 C  12 cm    3.8 <4.5 7.2 >5  Left Sural Anti Sensory (Lat Mall)  32 C  Calf    4.3 <4.5 7.3 >5  Left Ulnar Anti Sensory (5th Digit)  32 C  Wrist    3.1 <3.1 14.4 >12     Stim Site NR Onset (ms) Norm Onset (ms)  O-P Amp (mV) Norm O-P Amp Site1 Site2 Delta-0 (ms) Dist (cm) Vel (m/s) Norm Vel (m/s)  Left Median Motor (Abd Poll Brev)  32 C  Wrist    3.6 <3.9 11.2 >6 Elbow Wrist 6.4 33.0 52 >50  Elbow    10.0  10.4         Left Peroneal Motor (Ext Dig Brev)  32 C  Ankle    4.4 <5.5 5.2 >3 B Fib Ankle 9.5 40.0 42 >40  B Fib    13.9  4.6  Poplt B Fib 2.2 10.0 45 >40  Poplt    16.1  4.3         Left Tibial Motor (Abd Hall Brev)  32 C  Ankle    4.9 <6.0 8.8 >8 Knee Ankle 10.8 43.0 40 >40  Knee    15.7  4.7         Left Ulnar Motor (Abd Dig Minimi)  32 C  Wrist    3.0 <3.1 10.9 >7 B Elbow Wrist 4.4 28.0 64 >50  B Elbow    7.4  9.6  A Elbow B Elbow 1.6 10.0 63 >50  A Elbow    9.0  9.1          Electromyography   Side Muscle Ins.Act Fibs Fasc Recrt Amp Dur Poly Activation Comment  Left 1stDorInt Nml Nml Nml Nml Nml Nml Nml Nml N/A  Left Abd Poll  Brev Nml Nml Nml Nml Nml Nml Nml Nml N/A  Left PronatorTeres Nml Nml Nml Nml Nml Nml Nml Nml N/A  Left Biceps Nml Nml Nml Nml Nml Nml Nml Nml N/A  Left Triceps Nml Nml Nml Nml Nml Nml Nml Nml N/A  Left Deltoid Nml Nml Nml Nml Nml Nml Nml Nml N/A  Left AntTibialis Nml Nml Nml Nml Nml Nml Nml Nml N/A  Left Gastroc Nml Nml Nml Nml Nml Nml Nml Nml N/A  Left Flex Dig Long Nml Nml Nml Nml Nml Nml Nml Nml N/A  Left RectFemoris Nml Nml Nml Nml Nml Nml Nml Nml N/A  Left GluteusMed Nml Nml Nml Nml Nml Nml Nml Nml N/A      Waveforms:

## 2023-06-01 NOTE — Progress Notes (Signed)
Called patients [employer] and no PA is required for cpt 11104 & 11105. Call ref [phone].

## 2023-06-01 NOTE — Progress Notes (Signed)
 Office Visit Note  Patient: Miguel Evans             Date of Birth: 07/24/1980           MRN: [id number]             PCP: Nonnie Done., MD Referring: Nonnie Done., MD Visit Date: 06/15/2023 Occupation: @GUAROCC @  Subjective:  Right trochanteric bursitis   History of Present Illness: Jamaurion Slemmer is a 43 y.o. male with history of psoriatic arthritis and gout.  Patient remains on Taltz 80 mg sq injections every 28 days.  He is tolerating Taltz without any side effects or injection site reactions.  He has not had any recent gaps in therapy.  He denies any signs or symptoms of a psoriatic arthritis flare.  He has not had any recent gout flares.  Patient reports that he continues to take gabapentin 300 mg at bedtime which has been helping with nocturnal pain and neurologist.  He is under the care of Dr. Allena Katz and had a recent NCV with EMG on 05/22/2023.  He is currently undergoing workup for small fiber neuropathy. He presents today with ongoing pain and stiffness involving the cervical spine, trochanter bursitis of the right hip, and a Baker's cyst behind the left knee.  He requested a right trochanteric bursa cortisone injection today.  He denies any joint swelling at this time.  He denies any new medical conditions.  He denies any recent or recurrent infections.     Activities of Daily Living:  Patient reports morning stiffness for 45-60 minutes.   Patient Reports nocturnal pain.  Difficulty dressing/grooming: Reports Difficulty climbing stairs: Reports Difficulty getting out of chair: Reports Difficulty using hands for taps, buttons, cutlery, and/or writing: Reports  Review of Systems  Constitutional:  Positive for fatigue.  HENT:  Positive for mouth dryness. Negative for mouth sores.   Eyes:  Positive for dryness.  Respiratory:  Negative for shortness of breath.   Cardiovascular:  Negative for chest pain and palpitations.  Gastrointestinal:  Positive for blood in stool. Negative  for constipation and diarrhea.  Endocrine: Negative for increased urination.  Genitourinary:  Negative for involuntary urination.  Musculoskeletal:  Positive for joint pain, gait problem, joint pain, joint swelling, myalgias, muscle weakness, morning stiffness, muscle tenderness and myalgias.  Skin:  Negative for color change, rash, hair loss and sensitivity to sunlight.  Allergic/Immunologic: Negative for susceptible to infections.  Neurological:  Positive for headaches. Negative for dizziness.  Hematological:  Negative for swollen glands.  Psychiatric/Behavioral:  Negative for depressed mood and sleep disturbance. The patient is not nervous/anxious.     PMFS History:  Patient Active Problem List   Diagnosis Date Noted  . Cervical disc disorder with radiculopathy 02/22/2019  . Myofascial pain syndrome 02/22/2019  . Tear of right rotator cuff 09/23/2018  . Psoriasis 03/27/2017  . 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 Patellofemoral arthralgia of both knees 02/24/2017  . Hyperuricemia 03/26/2016  . Elevated blood pressure  03/26/2016  . Elevated LFTs 03/26/2016  . Anxiety 03/26/2016  . Psoriatic arthritis (HCC) 03/25/2016  . High risk medication use 03/25/2016  . Cervicalgia 03/25/2016  . Hand pain 03/25/2016  . 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 Anxiety   . Arthritis   . Gout   . Hypertension   . Psoriasis   . Psoriatic arthritis (HCC)   . Sleep apnea     Family History  Problem Relation  Age of Onset  . Cancer Mother        breast   . Bipolar disorder Mother   . Hypertension Father   . Osteoarthritis Father   . Emphysema Maternal Grandfather   . CAD Paternal Grandfather   . Healthy Son   . Healthy Son   . Healthy Daughter   . Healthy Daughter    Past Surgical History:  Procedure Laterality Date  . ANTERIOR CRUCIATE LIGAMENT REPAIR Bilateral Left in 2002, Right 2013  . ANTERIOR CRUCIATE LIGAMENT REPAIR Bilateral 2014  .  ROTATOR CUFF REPAIR Left 06/05/2020  . SHOULDER ARTHROSCOPY WITH SUBACROMIAL DECOMPRESSION AND OPEN ROTATOR C Right 09/23/2018   Procedure: SHOULDER ARTHROSCOPY WITH SUBACROMIAL DECOMPRESSION AND OPEN ROTATOR CUFF REPAIR,;  Surgeon: Valeria Batman, MD;  Location: [facility];  Service: Orthopedics;  Laterality: Right;  WITH BLOCK  . VASECTOMY     Social History   Social History Narrative   Right handed   4 children   One story home         Are you right handed or left handed? Right Handed   Are you currently employed ? No   What is your current occupation?   Do you live at home alone? No    Who lives with you? Spouse and two children    What type of home do you live in: 1 story or 2 story? Lives in a one story home        There is no immunization history on file for this patient.   Objective: Vital Signs: BP 130/83 (BP Location: Left Arm, Patient Position: Sitting, Cuff Size: Large)   Pulse 76   Resp 14   Ht 6\' 3"  (1.905 m)   Wt 289 lb (131.1 kg)   BMI 36.12 kg/m    Physical Exam Vitals and nursing note reviewed.  Constitutional:      Appearance: He is well-developed.  HENT:     Head: Normocephalic and atraumatic.  Eyes:     Conjunctiva/sclera: Conjunctivae normal.     Pupils: Pupils are equal, round, and reactive to light.  Cardiovascular:     Rate and Rhythm: Normal rate and regular rhythm.     Heart sounds: Normal heart sounds.  Pulmonary:     Effort: Pulmonary effort is normal.     Breath sounds: Normal breath sounds.  Abdominal:     General: Bowel sounds are normal.     Palpations: Abdomen is soft.  Musculoskeletal:     Cervical back: Normal range of motion and neck supple.  Skin:    General: Skin is warm and dry.     Capillary Refill: Capillary refill takes less than 2 seconds.  Neurological:     Mental Status: He is alert and oriented to person, place, and time.  Psychiatric:        Behavior: Behavior normal.     Musculoskeletal Exam: C-spine has  painful limited range of motion.  Shoulder joints have good range of motion.  Elbow joints, wrist joints MCPs, PIPs, DIPs have good range of motion with no synovitis.  Complete fist formation bilaterally.  Hip joints have good range of motion with tenderness over the right trochanteric bursa.  Knee joints have good range of motion with no warmth or effusion.  Fullness behind the posterior aspect of the left knee noted.  Ankle joints have good range of motion with no tenderness or joint swelling.  No evidence of Achilles tendinitis or plantar fasciitis.  CDAI Exam:  CDAI Score: -- Patient Global: --; Provider Global: -- Swollen: --; Tender: -- Joint Exam 06/15/2023   No joint exam has been documented for this visit   There is currently no information documented on the homunculus. Go to the Rheumatology activity and complete the homunculus joint exam.  Investigation: No additional findings.  Imaging: NCV with EMG(electromyography) Result Date: 05/22/2023 Glendale Chard, DO     05/22/2023  4:17 PM [facility] [address] [place], [address]  [place], [place] [address] Tel: [phone] Fax: [phone] Test Date:  05/22/2023 Patient: Kaesyn Johnston DOB: 03-21-1981 Physician: Nita Sickle, DO Sex: Male Height: 6\' 3"  Ref Phys: Nita Sickle, DO ID#: [id number]   Technician:  History: This is a 43 year old man referred for evaluation of generalized paresthesias of the hands and feet. NCV & EMG Findings: Extensive electrodiagnostic testing of the left upper and lower extremity shows: Left median sensory response shows prolonged latency (3.9 ms) and reduced amplitude (16.8 V).  Left ulnar, sural, and superficial peroneal sensory responses are within normal limits.  Left median, ulnar, peroneal, and tibial motor responses are within normal limits.  Left tibial H reflex study is within normal limits, when adjusted for patient's height.  There is no evidence of active or chronic motor axonal loss changes  affecting any of the tested muscles.  Motor unit configuration and recruitment pattern is within normal limits.  Impression: Left median neuropathy at or distal to the wrist, consistent with a clinical diagnosis of carpal tunnel syndrome.  Overall, these findings are moderate in degree electrically and has progressed compared to prior study on 04/06/2019. In particular, there is no evidence of a cervical/lumbosacral radiculopathy or sensorimotor polyneuropathy affecting the left side. ___________________________ Nita Sickle, DO Nerve Conduction Studies  Stim Site NR Peak (ms) Norm Peak (ms) O-P Amp (V) Norm O-P Amp Left Median Anti Sensory (2nd Digit)  32 C Wrist    *3.9 <3.4 *16.8 >20 Left Sup Peroneal Anti Sensory (Ant Lat Mall)  32 C 12 cm    3.8 <4.5 7.2 >5 Left Sural Anti Sensory (Lat Mall)  32 C Calf    4.3 <4.5 7.3 >5 Left Ulnar Anti Sensory (5th Digit)  32 C Wrist    3.1 <3.1 14.4 >12  Stim Site NR Onset (ms) Norm Onset (ms) O-P Amp (mV) Norm O-P Amp Site1 Site2 Delta-0 (ms) Dist (cm) Vel (m/s) Norm Vel (m/s) Left Median Motor (Abd Poll Brev)  32 C Wrist    3.6 <3.9 11.2 >6 Elbow Wrist 6.4 33.0 52 >50 Elbow    10.0  10.4        Left Peroneal Motor (Ext Dig Brev)  32 C Ankle    4.4 <5.5 5.2 >3 B Fib Ankle 9.5 40.0 42 >40 B Fib    13.9  4.6  Poplt B Fib 2.2 10.0 45 >40 Poplt    16.1  4.3        Left Tibial Motor (Abd Hall Brev)  32 C Ankle    4.9 <6.0 8.8 >8 Knee Ankle 10.8 43.0 40 >40 Knee    15.7  4.7        Left Ulnar Motor (Abd Dig Minimi)  32 C Wrist    3.0 <3.1 10.9 >7 B Elbow Wrist 4.4 28.0 64 >50 B Elbow    7.4  9.6  A Elbow B Elbow 1.6 10.0 63 >50 A Elbow    9.0  9.1        Electromyography  Side  Muscle Ins.Act Fibs Fasc Recrt Amp Dur Poly Activation Comment Left 1stDorInt Nml Nml Nml Nml Nml Nml Nml Nml N/A Left Abd Poll Brev Nml Nml Nml Nml Nml Nml Nml Nml N/A Left PronatorTeres Nml Nml Nml Nml Nml Nml Nml Nml N/A Left Biceps Nml Nml Nml Nml Nml Nml Nml Nml N/A Left Triceps Nml Nml Nml Nml  Nml Nml Nml Nml N/A Left Deltoid Nml Nml Nml Nml Nml Nml Nml Nml N/A Left AntTibialis Nml Nml Nml Nml Nml Nml Nml Nml N/A Left Gastroc Nml Nml Nml Nml Nml Nml Nml Nml N/A Left Flex Dig Long Nml Nml Nml Nml Nml Nml Nml Nml N/A Left RectFemoris Nml Nml Nml Nml Nml Nml Nml Nml N/A Left GluteusMed Nml Nml Nml Nml Nml Nml Nml Nml N/A Waveforms:                   Recent Labs: Lab Results  Component Value Date   WBC 6.3 03/16/2023   HGB 16.2 03/16/2023   PLT 204 03/16/2023   NA 145 03/16/2023   K 5.0 03/16/2023   CL 106 03/16/2023   CO2 28 03/16/2023   GLUCOSE 92 03/16/2023   BUN 14 03/16/2023   CREATININE 1.15 03/16/2023   BILITOT 1.0 03/16/2023   ALKPHOS 74 10/24/2016   AST 23 03/16/2023   ALT 28 03/16/2023   PROT 7.2 03/16/2023   ALBUMIN 4.7 10/24/2016   CALCIUM 9.7 03/16/2023   GFRAA 103 08/31/2020   QFTBGOLDPLUS NEGATIVE 03/16/2023    Speciality Comments: Humira, Enbrel12/17-06/18, Cosyntex 06/18-09/20,Taltz 10/20 MTX -high LFTs  Procedures:  Large Joint Inj: R greater trochanter on 06/15/2023 1:03 PM Indications: pain Details: 27 G 1.5 in needle, lateral approach  Arthrogram: No  Medications: 1.5 mL lidocaine 1 %; 40 mg triamcinolone acetonide 40 MG/ML Aspirate: 0 mL Outcome: tolerated well, no immediate complications Procedure, treatment alternatives, risks and benefits explained, specific risks discussed. Consent was given by the patient. Immediately prior to procedure a time out was called to verify the correct patient, procedure, equipment, support staff and site/side marked as required. Patient was prepped and draped in the usual sterile fashion.    Allergies: Patient has no known allergies.   Assessment / Plan:     Visit Diagnoses: Psoriatic arthritis (HCC): No synovitis or dactylitis noted on examination today.  He has not had any signs or symptoms of a psoriatic arthritis flare.  He has no active psoriasis at this time.  No evidence of Achilles tendinitis or plantar  fasciitis.  Overall his symptoms have remained stable on Taltz 80 mg sq injections every 28 days.  He continues to tolerate Taltz without any side effects or injection site reactions.  No recent or recurrent infections.  No recent gaps in therapy.  Patient presents today with a recurrence of pain on the lateral aspect of his right hip consistent with trochanter bursitis.  He requested a right trochanteric bursa cortisone injection today.  He tolerated procedure well.  Procedure note was completed above.  Aftercare was discussed.  He has also had ongoing pain and stiffness involving the left knee particularly if he has a flare of the Baker's cyst in the posterior aspect of his knee.  He has requested to set up an ultrasound-guided Baker's cyst aspiration in the future. Patient will remain on Taltz as prescribed.  He was advised to notify us if he develops signs or symptoms of a flare.  He will follow-up in the office in 3 months or sooner  if needed.  Psoriasis: No active psoriasis at this time.  High risk medication use - Taltz 80 mg sq injections every 28 days. Hx of vasectomy. Discontinued arava-no clinical benefit.  CBC and CMP updated on 03/16/23.  Orders for CBC and CMP released today.  TB gold negative 03/16/23.  No recent or recurrent infections. Discussed the importance of holding taltz if he develops signs or symptoms of an infection and to resume once the infection has completely cleared.  - Plan: CBC with Differential/Platelet, COMPLETE METABOLIC PANEL WITH GFR  Idiopathic chronic gout of multiple sites without tophus - He has not had any signs or symptoms of a gout flare.  He has clinically been doing well taking allopurinol 300 mg 1 tablet by mouth daily and colchicine 0.6 mg as needed during flares. Uric acid will be rechecked today.  - Plan: Uric acid  S/P right rotator cuff repair: Good ROM with no increased discomfort at this time.   Chronic left shoulder pain: Good ROM with no  discomfort currently.   Primary osteoarthritis of both knees: Patient has good range of motion of both knee joints on examination today.  Some fullness noted in the posterior aspect of the left knee.  Synovial cyst of left popliteal space: Chronic pain and stiffness.  Patient reports that he has previously had his left Baker's cyst aspirated which alleviated his symptoms.  He requested to set up a repeat aspiration with ultrasound guidance in the future.  Chronic left SI joint pain: Intermittent discomfort.   Trochanteric bursitis, right hip: Patient presents today with ongoing pain on the lateral aspect of his right hip consistent with trochanteric bursitis.  He had a right trochanter bursa cortisone injection performed on 03/31/2022 which righted significant relief but his symptoms have recurred.  Different treatment options were discussed--patient requested a repeat right trochanteric bursa cortisone injection today. He tolerated the procedure well.  Procedure note was completed above.  Aftercare was discussed.  He was advised to notify us if his symptoms persist or worsen.  DDD (degenerative disc disease), cervical: MRI of the cervical spine obtained on 01/28/2019: Degenerative changes throughout the cervical and upper thoracic region as outlined above. No compressive central canal stenosis.  Bilateral foraminal narrowing at C6-7 that would have some potential to affect either C7 nerve. Lesser foraminal narrowing on the left at C4-5 due to uncovertebral encroachment could possibly affect the left C5 nerve. Chronic pain and stiffness.  Taking gabapentin 300 mg at bedtime.  Under the care of Dr. Allena Katz.  Underwent NCV with EMG on 05/22/2023.  He is currently undergoing a workup for small fiber neuropathy.  Fibromyalgia: Patient continues to experience intermittent myalgias and muscle tenderness due to fibromyalgia.  He has been exercising on a regular basis which has helped improve his energy level and  weight loss.  He has been taking gabapentin 300 mg at bedtime which continues to help with nocturnal pain.  He also remains on Cymbalta as prescribed.  Other medical conditions are listed as follows:  History of hypertension: Blood pressure was 130/83 today in the office.  History of sleep apnea  Orders: Orders Placed This Encounter  Procedures  . Large Joint Inj: R greater trochanter  . CBC with Differential/Platelet  . COMPLETE METABOLIC PANEL WITH GFR  . Uric acid   No orders of the defined types were placed in this encounter.   Follow-Up Instructions: Return in about 3 months (around 09/15/2023) for Psoriatic arthritis, Gout, Osteoarthritis.   Gearldine Bienenstock,  PA-C  Note - This record has been created using [employer].  Chart creation errors have been sought, but may not always  have been located. Such creation errors do not reflect on  the standard of medical care.

## 2023-06-09 ENCOUNTER — Ambulatory Visit (INDEPENDENT_AMBULATORY_CARE_PROVIDER_SITE_OTHER): Payer: BC Managed Care – PPO | Admitting: Neurology

## 2023-06-09 DIAGNOSIS — G629 Polyneuropathy, unspecified: Secondary | ICD-10-CM | POA: Diagnosis not present

## 2023-06-09 DIAGNOSIS — R202 Paresthesia of skin: Secondary | ICD-10-CM | POA: Diagnosis not present

## 2023-06-09 NOTE — Progress Notes (Signed)
 Punch Biopsy Procedure Note  Preprocedure Diagnosis: paresthesia of skin   Postprocedure Diagnosis: same  Locations: Site 1: left lateral distal leg;  Site 2: left lateral thigh;   Indications: r/o small fiber neuropathy  Anesthesia: 5 mL Lidocaine 1% with epinephrine  Procedure Details Patient informed of the risks (including but not limited to bleeding, pain, infection, scar and infection) and benefits of the procedure.  Informed consent obtained.  The areas which were chosen for biopsy, as above, and surrounding areas were given a sterile prep using alcohol and iodine. The skin was then stretched perpendicular to the skin tension lines and sample removed using the 3 mm punch. Pressure applied, hemostasis achieved.   Dressing applied. The specimen(s) was sent for pathologic examination. The patient tolerated the procedure well.  Estimated Blood Loss: 1 ml  Condition: Stable  Complications: none.  Plan: 1. Instructed to keep the wound dry and covered for 24h and clean thereafter. 2. Warning signs of infection were reviewed.    Jacquelyne Balint, MD [facility]

## 2023-06-15 ENCOUNTER — Ambulatory Visit: Payer: BC Managed Care – PPO | Attending: Physician Assistant | Admitting: Physician Assistant

## 2023-06-15 ENCOUNTER — Encounter: Payer: Self-pay | Admitting: Physician Assistant

## 2023-06-15 VITALS — BP 130/83 | HR 76 | Resp 14 | Ht 75.0 in | Wt 289.0 lb

## 2023-06-15 DIAGNOSIS — M7122 Synovial cyst of popliteal space [Baker], left knee: Secondary | ICD-10-CM

## 2023-06-15 DIAGNOSIS — M533 Sacrococcygeal disorders, not elsewhere classified: Secondary | ICD-10-CM

## 2023-06-15 DIAGNOSIS — L409 Psoriasis, unspecified: Secondary | ICD-10-CM

## 2023-06-15 DIAGNOSIS — M25512 Pain in left shoulder: Secondary | ICD-10-CM

## 2023-06-15 DIAGNOSIS — M7061 Trochanteric bursitis, right hip: Secondary | ICD-10-CM | POA: Diagnosis not present

## 2023-06-15 DIAGNOSIS — G8929 Other chronic pain: Secondary | ICD-10-CM

## 2023-06-15 DIAGNOSIS — Z8679 Personal history of other diseases of the circulatory system: Secondary | ICD-10-CM

## 2023-06-15 DIAGNOSIS — M170 Bilateral primary osteoarthritis of knee: Secondary | ICD-10-CM

## 2023-06-15 DIAGNOSIS — Z79899 Other long term (current) drug therapy: Secondary | ICD-10-CM

## 2023-06-15 DIAGNOSIS — L4050 Arthropathic psoriasis, unspecified: Secondary | ICD-10-CM

## 2023-06-15 DIAGNOSIS — M5030 Other cervical disc degeneration, unspecified cervical region: Secondary | ICD-10-CM

## 2023-06-15 DIAGNOSIS — Z8669 Personal history of other diseases of the nervous system and sense organs: Secondary | ICD-10-CM

## 2023-06-15 DIAGNOSIS — M1A09X0 Idiopathic chronic gout, multiple sites, without tophus (tophi): Secondary | ICD-10-CM | POA: Diagnosis not present

## 2023-06-15 DIAGNOSIS — M797 Fibromyalgia: Secondary | ICD-10-CM

## 2023-06-15 DIAGNOSIS — Z9889 Other specified postprocedural states: Secondary | ICD-10-CM

## 2023-06-15 MED ORDER — LIDOCAINE HCL 1 % IJ SOLN
1.5000 mL | INTRAMUSCULAR | Status: AC | PRN
  Administered 2023-06-15: 1.5 mL

## 2023-06-15 MED ORDER — TRIAMCINOLONE ACETONIDE 40 MG/ML IJ SUSP
40.0000 mg | INTRAMUSCULAR | Status: AC | PRN
Start: 2023-06-15 — End: 2023-06-15
  Administered 2023-06-15: 40 mg via INTRA_ARTICULAR

## 2023-06-16 LAB — COMPLETE METABOLIC PANEL WITH GFR
AG Ratio: 2.4 (calc) (ref 1.0–2.5)
ALT: 21 U/L (ref 9–46)
AST: 22 U/L (ref 10–40)
Albumin: 5 g/dL (ref 3.6–5.1)
Alkaline phosphatase (APISO): 75 U/L (ref 36–130)
BUN: 20 mg/dL (ref 7–25)
CO2: 30 mmol/L (ref 20–32)
Calcium: 10.1 mg/dL (ref 8.6–10.3)
Chloride: 103 mmol/L (ref 98–110)
Creat: 1.02 mg/dL (ref 0.60–1.29)
Globulin: 2.1 g/dL (ref 1.9–3.7)
Glucose, Bld: 81 mg/dL (ref 65–99)
Potassium: 4.8 mmol/L (ref 3.5–5.3)
Sodium: 139 mmol/L (ref 135–146)
Total Bilirubin: 0.6 mg/dL (ref 0.2–1.2)
Total Protein: 7.1 g/dL (ref 6.1–8.1)
eGFR: 95 mL/min/{1.73_m2} (ref >=60)

## 2023-06-16 LAB — CBC WITH DIFFERENTIAL/PLATELET
Absolute Lymphocytes: 2827 {cells}/uL (ref 850–3900)
Absolute Monocytes: 697 {cells}/uL (ref 200–950)
Basophils Absolute: 49 {cells}/uL (ref 0–200)
Basophils Relative: 0.6 %
Eosinophils Absolute: 211 {cells}/uL (ref 15–500)
Eosinophils Relative: 2.6 %
HCT: 47.5 % (ref 38.5–50.0)
Hemoglobin: 16 g/dL (ref 13.2–17.1)
MCH: 31.9 pg (ref 27.0–33.0)
MCHC: 33.7 g/dL (ref 32.0–36.0)
MCV: 94.6 fL (ref 80.0–100.0)
MPV: 10.2 fL (ref 7.5–12.5)
Monocytes Relative: 8.6 %
Neutro Abs: 4317 {cells}/uL (ref 1500–7800)
Neutrophils Relative %: 53.3 %
Platelets: 224 10*3/uL (ref 140–400)
RBC: 5.02 10*6/uL (ref 4.20–5.80)
RDW: 12.9 % (ref 11.0–15.0)
Total Lymphocyte: 34.9 %
WBC: 8.1 10*3/uL (ref 3.8–10.8)

## 2023-06-16 LAB — URIC ACID: Uric Acid, Serum: 6.5 mg/dL (ref 4.0–8.0)

## 2023-06-16 NOTE — Progress Notes (Signed)
 CBC and CMP WNL  uric acid-6.5-improving.

## 2023-06-17 ENCOUNTER — Ambulatory Visit

## 2023-06-17 ENCOUNTER — Ambulatory Visit: Attending: Rheumatology | Admitting: Rheumatology

## 2023-06-17 ENCOUNTER — Encounter: Payer: Self-pay | Admitting: Rheumatology

## 2023-06-17 DIAGNOSIS — M25562 Pain in left knee: Secondary | ICD-10-CM

## 2023-06-17 DIAGNOSIS — G8929 Other chronic pain: Secondary | ICD-10-CM | POA: Diagnosis not present

## 2023-06-17 DIAGNOSIS — M7122 Synovial cyst of popliteal space [Baker], left knee: Secondary | ICD-10-CM

## 2023-06-17 LAB — SYNOVIAL FLUID ANALYSIS, COMPLETE
Basophils, %: 0 %
Eosinophils-Synovial: 0 % (ref 0–2)
Lymphocytes-Synovial Fld: 22 % (ref 0–74)
Monocyte/Macrophage: 73 % — ABNORMAL HIGH (ref 0–69)
Neutrophil, Synovial: 1 % (ref 0–24)
Synoviocytes, %: 4 % (ref 0–15)
WBC, Synovial: 455 {cells}/uL — ABNORMAL HIGH (ref <150)

## 2023-06-17 MED ORDER — LIDOCAINE HCL 1 % IJ SOLN
5.0000 mL | INTRAMUSCULAR | Status: AC | PRN
  Administered 2023-06-17: 5 mL

## 2023-06-17 MED ORDER — TRIAMCINOLONE ACETONIDE 40 MG/ML IJ SUSP
60.0000 mg | INTRAMUSCULAR | Status: AC | PRN
  Administered 2023-06-17: 60 mg via INTRA_ARTICULAR

## 2023-06-17 NOTE — Progress Notes (Signed)
 Synovial fluid is not inflammatory.  Crystals were negative for gout or pseudogout.

## 2023-06-17 NOTE — Progress Notes (Signed)
   Procedure Note  Patient: Miguel Evans             Date of Birth: 08/01/1980           MRN: [id number]             Visit Date: 06/17/2023  Procedures: Visit Diagnoses:  1. Chronic pain of left knee   2. Baker's cyst of knee, left   Patient presents today for the Baker's cyst aspiration.  He has been having pain and difficulty walking. Ultrasound guided injection is preferred based studies that show increased duration, increased effect, greater accuracy, decreased procedural pain, increased response rate, and decreased cost with ultrasound guided versus blind injection.   Verbal informed consent obtained.  Time-out conducted.  Noted no overlying erythema, induration, or other signs of local infection. Ultrasound-guided Baker's cyst aspiration: After sterile prep with Betadine, injected 5 ml of 1% lidocaine and 60 mg Kenalog using a 22-gauge needle, in the Baker's cyst.   Large Joint Inj: L knee on 06/17/2023 12:12 PM Indications: pain Details: 22 G 1.5 in needle, posterior approach  Arthrogram: No  Medications: 60 mg triamcinolone acetonide 40 MG/ML; 5 mL lidocaine 1 % Aspirate: 67 mL; sent for lab analysis Procedure, treatment alternatives, risks and benefits explained, specific risks discussed. Consent was given by the patient. Immediately prior to procedure a time out was called to verify the correct patient, procedure, equipment, support staff and site/side marked as required. Patient was prepped and draped in the usual sterile fashion.    Patient tolerated the procedure well.  The synovial fluid was sent for cell count and crystals.  Postprocedure instructions were given.  Patient reported immediate relief after the Baker's cyst aspiration.  Pollyann Savoy, MD

## 2023-06-23 ENCOUNTER — Other Ambulatory Visit: Payer: Self-pay | Admitting: Physician Assistant

## 2023-06-23 NOTE — Telephone Encounter (Signed)
 Last Fill: 03/23/2023  Next Visit: 09/16/2023  Last Visit: 06/15/2023  Dx: Fibromyalgia   Current Dose per office note on 06/15/2023: gabapentin 300 mg at bedtime   Okay to refill Gabapentin?

## 2023-06-29 ENCOUNTER — Telehealth: Payer: Self-pay | Admitting: Neurology

## 2023-06-29 DIAGNOSIS — G629 Polyneuropathy, unspecified: Secondary | ICD-10-CM

## 2023-06-29 DIAGNOSIS — R739 Hyperglycemia, unspecified: Secondary | ICD-10-CM

## 2023-06-29 NOTE — Telephone Encounter (Signed)
 Called patient and informed him of biopsy results and recommendations. Patient is ok with having labs ordered and is aware of lab hours and to come to our office to check in prior to having labs done. Patient also aware someone from the front will give him a call to get him scheduled for his 1 month follow up.

## 2023-06-29 NOTE — Telephone Encounter (Signed)
 Please let pt know that skin biopsy shows findings of small fiber neuropathy, which can explain his feet numbness/tingling.  I would like to check neuropathy labs - check ESR, CRP, TSH, HbA1c, vitamin B12, folate, vitamin B1, copper, SPEP with IFE.  Follow-up in 1 month to review results.

## 2023-07-09 ENCOUNTER — Other Ambulatory Visit: Payer: Self-pay | Admitting: *Deleted

## 2023-07-09 DIAGNOSIS — Z111 Encounter for screening for respiratory tuberculosis: Secondary | ICD-10-CM

## 2023-07-09 DIAGNOSIS — Z9225 Personal history of immunosupression therapy: Secondary | ICD-10-CM

## 2023-07-09 DIAGNOSIS — Z79899 Other long term (current) drug therapy: Secondary | ICD-10-CM

## 2023-07-09 DIAGNOSIS — L409 Psoriasis, unspecified: Secondary | ICD-10-CM

## 2023-07-09 DIAGNOSIS — L4050 Arthropathic psoriasis, unspecified: Secondary | ICD-10-CM

## 2023-07-09 DIAGNOSIS — M1A09X0 Idiopathic chronic gout, multiple sites, without tophus (tophi): Secondary | ICD-10-CM

## 2023-07-09 MED ORDER — TALTZ 80 MG/ML ~~LOC~~ SOAJ: 80.0000 mg | SUBCUTANEOUS | 2 refills | Status: DC

## 2023-07-09 NOTE — Telephone Encounter (Signed)
 Refill request received via fax from [facility] for Taltz  Last Fill: 04/14/2023  Labs: 06/15/2023 CBC and CMP WNL   TB Gold: 03/16/2023   Next Visit: 09/16/2023  Last Visit: 06/15/2023  DX: Psoriatic arthritis   Current Dose per office note 06/15/2023: Taltz 80 mg sq injections every 28 days   Okay to refill Taltz?

## 2023-07-15 ENCOUNTER — Other Ambulatory Visit

## 2023-07-15 DIAGNOSIS — F419 Anxiety disorder, unspecified: Secondary | ICD-10-CM | POA: Diagnosis not present

## 2023-07-15 DIAGNOSIS — G629 Polyneuropathy, unspecified: Secondary | ICD-10-CM | POA: Diagnosis not present

## 2023-07-15 DIAGNOSIS — Z79899 Other long term (current) drug therapy: Secondary | ICD-10-CM | POA: Diagnosis not present

## 2023-07-21 LAB — B12 AND FOLATE PANEL
Folate: 12.8 ng/mL
Vitamin B-12: 383 pg/mL (ref 200–1100)

## 2023-07-21 LAB — IMMUNOFIXATION ELECTROPHORESIS
IgG (Immunoglobin G), Serum: 828 mg/dL (ref 600–1640)
IgM, Serum: 87 mg/dL (ref 50–300)
Immunoglobulin A: 138 mg/dL (ref 47–310)

## 2023-07-21 LAB — PROTEIN ELECTROPHORESIS, SERUM
Albumin ELP: 4.7 g/dL (ref 3.8–4.8)
Alpha 1: 0.3 g/dL (ref 0.2–0.3)
Alpha 2: 0.6 g/dL (ref 0.5–0.9)
Beta 2: 0.4 g/dL (ref 0.2–0.5)
Beta Globulin: 0.5 g/dL (ref 0.4–0.6)
Gamma Globulin: 0.7 g/dL — ABNORMAL LOW (ref 0.8–1.7)
Total Protein: 7.2 g/dL (ref 6.1–8.1)

## 2023-07-21 LAB — HEMOGLOBIN A1C
Hgb A1c MFr Bld: 5.3 %{Hb} (ref <5.7)
Mean Plasma Glucose: 105 mg/dL
eAG (mmol/L): 5.8 mmol/L

## 2023-07-21 LAB — SEDIMENTATION RATE: Sed Rate: 2 mm/h (ref 0–15)

## 2023-07-21 LAB — TSH: TSH: 1.95 m[IU]/L (ref 0.40–4.50)

## 2023-07-21 LAB — COPPER, SERUM: Copper: 103 ug/dL (ref 70–175)

## 2023-07-21 LAB — VITAMIN B1: Vitamin B1 (Thiamine): 8 nmol/L (ref 8–30)

## 2023-07-21 LAB — C-REACTIVE PROTEIN: CRP: <3 mg/L (ref <8.0)

## 2023-07-22 ENCOUNTER — Encounter: Payer: Self-pay | Admitting: Neurology

## 2023-07-22 ENCOUNTER — Ambulatory Visit: Admitting: Neurology

## 2023-07-22 VITALS — BP 130/90 | HR 71 | Ht 75.0 in | Wt 281.0 lb

## 2023-07-22 DIAGNOSIS — G629 Polyneuropathy, unspecified: Secondary | ICD-10-CM | POA: Diagnosis not present

## 2023-07-22 MED ORDER — GABAPENTIN 100 MG PO CAPS: ORAL_CAPSULE | ORAL | 5 refills | Status: DC

## 2023-07-22 NOTE — Patient Instructions (Addendum)
 Start gabapentin 100mg  in the morning for one week, then increase to 2 tablets in the morning.    Please send a MyChart update in 4-6 weeks to let me know if we need to make another changes to your gabapentin.

## 2023-07-22 NOTE — Progress Notes (Unsigned)
 Follow-up Visit   Date: 07/22/2023    Miguel Evans MRN: [id number] DOB: 03-16-1981    Miguel Evans is a 42 y.o. right-handed Caucasian male with psoriatic arthritis, fibromyalgia, hypertension, and gout returning to the clinic for follow-up of small fiber neuropathy.  The patient was accompanied to the clinic by self.   IMPRESSION/PLAN: Small fiber neuropathy (biopsy proven).  Risk factors include autoimmune disease (psoriatic arthritis).  Neuropathy labs have returned normal. I had extensive discussion with the patient regarding the pathogenesis, etiology, management, and natural course of neuropathy.  Management is symptomatic.  For pain, I recommend that he take gabapentin 100mg  in the morning x 1 week, then increase to 200mg  at bedtime.  Continue to take gabapentin 300mg  at bedtime.   Return to clinic in 4 months  --------------------------------------------- History of present illness: He was seen in 2020 for left arm paresthesias.  EDX showed left carpal tunnel syndrome (mild).  Early 2024, his numbness/tingling started getting worse and now involves both arm (from the shoulder into the hands).  He has occasional heaviness, worse on the left. He has vibration sensation in both hands.  Arm symptoms are intermittent and variable.  Activity makes his symptoms worse.  He complains of neck pain.  He previously did neck PT about 2 years ago, with no benefit.  He takes gabapentin 100mg  in the morning and 300mg  at bedtime.     He has burning and stinging pain involving the middle three toes, which is constant.  He has low back pain and weakness in both legs.  He has fallen several times last year.    He does not work.  He last working in March 2023.  Nonsmoker.  He drinks alcohol 12-pack beer over the weekend.    UPDATE 07/22/2023:  He is here for follow-up visit.  His skin biopsy show findings of small fiber neuropathy.  He is taking gabapentin 300mg  at bedtime which helps nighttime symptoms,  however, he does not get any relief during the daytime.  He was started on gabapentin 100mg  in the morning which he stopped due to no improvement.  He did not tolerate 300mg  in the morning.  No new complaints.   Medications:  Current Outpatient Medications on File Prior to Visit  Medication Sig Dispense Refill   allopurinol (ZYLOPRIM) 300 MG tablet TAKE 1 TABLET DAILY 90 tablet 0   ALPRAZolam (XANAX) 0.5 MG tablet Take 0.5 mg by mouth 3 (three) times daily as needed.     amLODipine (NORVASC) 5 MG tablet Take 5 mg by mouth daily.     colchicine 0.6 MG tablet Take 1 tablet (0.6 mg total) by mouth daily. (Patient taking differently: Take 0.6 mg by mouth as needed.) 90 tablet 0   COLLAGEN PO Take by mouth.     DULoxetine (CYMBALTA) 60 MG capsule Take 60 mg by mouth daily.     gabapentin (NEURONTIN) 300 MG capsule TAKE 1 CAPSULE AT BEDTIME 90 capsule 0   glucosamine-chondroitin 500-400 MG tablet Take 1 tablet by mouth daily.     ixekizumab (TALTZ) 80 MG/ML pen Inject 1 mL (80 mg total) into the skin every 28 (twenty-eight) days. 1 mL 2   lisinopril (PRINIVIL,ZESTRIL) 20 MG tablet Take 20 mg by mouth daily.      metoprolol tartrate (LOPRESSOR) 25 MG tablet Take 25 mg by mouth 2 (two) times daily.     Multiple Vitamin (MULTIVITAMIN) capsule Take 1 capsule by mouth daily.     Omega-3 Fatty Acids (  FISH OIL) 1000 MG CAPS Take 1,000 mg by mouth daily.      testosterone cypionate (DEPOTESTOSTERONE CYPIONATE) 200 MG/ML injection Inject into the muscle every 14 (fourteen) days.     No current facility-administered medications on file prior to visit.    Allergies: No Known Allergies  Vital Signs:  BP (!) 130/90   Pulse 71   Ht 6\' 3"  (1.905 m)   Wt 281 lb (127.5 kg)   SpO2 100%   BMI 35.12 kg/m    Neurological Exam: MENTAL STATUS including orientation to time, place, person, recent and remote memory, attention span and concentration, language, and fund of knowledge is normal.  Speech is not  dysarthric.  CRANIAL NERVES:    Pupils equal round and reactive to light.  Normal conjugate, extra-ocular eye movements in all directions of gaze.  Trace left ptosis.  Face is symmetric. Palate elevates symmetrically.  Tongue is midline.  MOTOR:  Motor strength is 5/5 in all extremities.  No atrophy, fasciculations or abnormal movements.  No pronator drift.  Tone is normal.    MSRs:  Reflexes are 2+/4 throughout.  SENSORY:  Intact to vibration above the knees, mildly reduced at the ankles.    COORDINATION/GAIT:  Normal finger-to- nose-finger.  Intact rapid alternating movements bilaterally.  Gait is mildly antalgic due to left leg pain, unassisted and stable  Data: Skin biopsy 06/16/2023:  Significantly reduced epidermal nerve fiber density at the calf consistent with small fiber neuropathy  NCS/EMG of the left side 05/22/2023: Left median neuropathy at or distal to the wrist, consistent with a clinical diagnosis of carpal tunnel syndrome.  Overall, these findings are moderate in degree electrically and has progressed compared to prior study on 04/06/2019.   In particular, there is no evidence of a cervical/lumbosacral radiculopathy or sensorimotor polyneuropathy affecting the left side.    Thank you for allowing me to participate in patient's care.  If I can answer any additional questions, I would be pleased to do so.    Sincerely,    Katryn Plummer K. Allena Katz, DO

## 2023-07-30 ENCOUNTER — Other Ambulatory Visit: Payer: Self-pay | Admitting: *Deleted

## 2023-07-30 DIAGNOSIS — L4050 Arthropathic psoriasis, unspecified: Secondary | ICD-10-CM

## 2023-07-30 DIAGNOSIS — Z79899 Other long term (current) drug therapy: Secondary | ICD-10-CM

## 2023-07-30 DIAGNOSIS — L409 Psoriasis, unspecified: Secondary | ICD-10-CM

## 2023-07-30 MED ORDER — TALTZ 80 MG/ML ~~LOC~~ SOAJ: 80.0000 mg | SUBCUTANEOUS | 0 refills | Status: DC

## 2023-07-30 NOTE — Telephone Encounter (Signed)
 Received a phone call from [facility] stating they need a prescription for a 90 day supply of Taltz instead of a 30 day with 2 refills.

## 2023-08-11 ENCOUNTER — Other Ambulatory Visit: Payer: Self-pay | Admitting: Physician Assistant

## 2023-08-11 DIAGNOSIS — M1A09X0 Idiopathic chronic gout, multiple sites, without tophus (tophi): Secondary | ICD-10-CM

## 2023-08-11 NOTE — Telephone Encounter (Signed)
 Last Fill: 05/11/2023  Labs: 06/15/2023 CBC and CMP WNL uric acid-6.5-improving.  Next Visit: 09/16/2023  Last Visit: 06/15/2023  DX: Psoriatic arthritis   Current Dose per office note 06/15/2023: allopurinol  300 mg 1 tablet by mouth daily   Okay to refill Allopurinol ?

## 2023-08-13 DIAGNOSIS — G629 Polyneuropathy, unspecified: Secondary | ICD-10-CM

## 2023-08-13 HISTORY — DX: Polyneuropathy, unspecified: G62.9

## 2023-09-02 NOTE — Progress Notes (Unsigned)
 Office Visit Note  Patient: Miguel Evans             Date of Birth: 25-Jun-1980           MRN: [id number]             PCP: Lucius Sabins., MD Referring: Lucius Sabins., MD Visit Date: 09/16/2023 Occupation: @GUAROCC @  Subjective:  Right hip pain   History of Present Illness: Miguel Evans is a 43 y.o. male with history of psoriatic arthritis and gout.  Patient remains on Taltz  80 mg sq injections every 28 days.  He is tolerating Taltz  without any side effects or injection site reactions.  His most recent dose of Taltz  was administered yesterday.  Patient states that about 1 week prior to his Taltz  injection he has noticed some increased arthralgias and joint stiffness.  Patient states over the past few weeks he has had decreased grip strength in both hands as well as some inflammation in the middle finger bilaterally.  Patient states he has been going to the gym on a regular basis and has been able to grip weights but drops things throughout the day.  He denies any Achilles tendinitis or plantar fasciitis.  He denies any active psoriasis at this time.  Patient reports that the generalized burning and tingling he is experiencing in his hands and feet is well-controlled taking gabapentin  300 mg in the morning and 300 mg in the evening.  He requested a refill of gabapentin  to be sent to the pharmacy today.  Patient has had persistent discomfort in his right hip specifically in the groin as well as a recurrence of a Baker's cyst behind the left knee.  Patient states after having the left Baker's cyst drained he had relief for only about 3 weeks. He denies any signs or symptoms of a gout flare.  He has clinically been doing well taking allopurinol  300 mg 1 tablet by mouth daily. He denies any recent or recurrent infections.    Activities of Daily Living:  Patient reports morning stiffness for 1 hour.   Patient Reports nocturnal pain.  Difficulty dressing/grooming: Reports Difficulty climbing  stairs: Reports Difficulty getting out of chair: Reports Difficulty using hands for taps, buttons, cutlery, and/or writing: Reports  Review of Systems  Constitutional:  Positive for fatigue.  HENT:  Positive for mouth dryness. Negative for mouth sores.   Eyes:  Positive for dryness.  Respiratory:  Negative for shortness of breath.   Cardiovascular:  Positive for palpitations. Negative for chest pain.  Gastrointestinal:  Positive for blood in stool, constipation and diarrhea.  Endocrine: Negative for increased urination.  Genitourinary:  Negative for involuntary urination.  Musculoskeletal:  Positive for joint pain, gait problem, joint pain, joint swelling, myalgias, muscle weakness, morning stiffness, muscle tenderness and myalgias.  Skin:  Negative for color change, rash, hair loss and sensitivity to sunlight.  Allergic/Immunologic: Negative for susceptible to infections.  Neurological:  Positive for headaches. Negative for dizziness.  Hematological:  Negative for swollen glands.  Psychiatric/Behavioral:  Negative for depressed mood and sleep disturbance. The patient is nervous/anxious.     PMFS History:  Patient Active Problem List   Diagnosis Date Noted   Cervical disc disorder with radiculopathy 02/22/2019   Myofascial pain syndrome 02/22/2019   Tear of right rotator cuff 09/23/2018   Psoriasis 03/27/2017   Polyarthritis 02/24/2017   Patellofemoral arthralgia of both knees 02/24/2017   Hyperuricemia 03/26/2016   Elevated blood pressure  03/26/2016   Elevated LFTs  03/26/2016   Anxiety 03/26/2016   Psoriatic arthritis (HCC) 03/25/2016   High risk medication use 03/25/2016   Cervicalgia 03/25/2016   Hand pain 03/25/2016   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    Past Medical History:  Diagnosis Date   Anxiety    Arthritis    Gout    Hypertension    Psoriasis    Psoriatic arthritis (HCC)    Sleep apnea    Small fiber neuropathy 08/2023    Family History  Problem  Relation Age of Onset   Cancer Mother        breast    Bipolar disorder Mother    Hypertension Father    Osteoarthritis Father    Emphysema Maternal Grandfather    CAD Paternal Grandfather    Healthy Son    Healthy Son    Healthy Daughter    Healthy Daughter    Past Surgical History:  Procedure Laterality Date   ANTERIOR CRUCIATE LIGAMENT REPAIR Bilateral Left in 2002, Right 2013   ANTERIOR CRUCIATE LIGAMENT REPAIR Bilateral 2014   ROTATOR CUFF REPAIR Left 06/05/2020   SHOULDER ARTHROSCOPY WITH SUBACROMIAL DECOMPRESSION AND OPEN ROTATOR C Right 09/23/2018   Procedure: SHOULDER ARTHROSCOPY WITH SUBACROMIAL DECOMPRESSION AND OPEN ROTATOR CUFF REPAIR,;  Surgeon: Shirlee Dotter, MD;  Location: [facility];  Service: Orthopedics;  Laterality: Right;  WITH BLOCK   VASECTOMY     Social History   Social History Narrative   Right handed   4 children   One story home         Are you right handed or left handed? Right Handed   Are you currently employed ? No   What is your current occupation?   Do you live at home alone? No    Who lives with you? Spouse and two children    What type of home do you live in: 1 story or 2 story? Lives in a one story home        There is no immunization history on file for this patient.   Objective: Vital Signs: BP (!) 152/101 (BP Location: Left Arm, Patient Position: Sitting, Cuff Size: Normal)   Pulse 76   Resp 15   Ht 6\' 3"  (1.905 m)   Wt 277 lb 6.4 oz (125.8 kg)   BMI 34.67 kg/m    Physical Exam Vitals and nursing note reviewed.  Constitutional:      Appearance: He is well-developed.  HENT:     Head: Normocephalic and atraumatic.  Eyes:     Conjunctiva/sclera: Conjunctivae normal.     Pupils: Pupils are equal, round, and reactive to light.  Cardiovascular:     Rate and Rhythm: Normal rate and regular rhythm.     Heart sounds: Normal heart sounds.  Pulmonary:     Effort: Pulmonary effort is normal.     Breath sounds: Normal breath  sounds.  Abdominal:     General: Bowel sounds are normal.     Palpations: Abdomen is soft.  Musculoskeletal:     Cervical back: Normal range of motion and neck supple.  Skin:    General: Skin is warm and dry.     Capillary Refill: Capillary refill takes less than 2 seconds.  Neurological:     Mental Status: He is alert and oriented to person, place, and time.  Psychiatric:        Behavior: Behavior normal.      Musculoskeletal Exam: C-spine has limited range of motion with lateral  rotation.  Midline spinal tenderness in the lower lumbar region.  Shoulder joints have good range of motion.  Elbow joints have good range of motion.  Limited extension of the right wrist.  Tenderness and inflammation noted in the third PIP joint bilaterally.  Painful range of motion of the right hip.  Left hip has full range of motion with no discomfort.  Knee joints have good range of motion with no warmth or effusion.  Baker's cyst palpable in the left knee.  Ankle joints have good range of motion with no tenderness or joint swelling.  No evidence of Achilles tendinitis or plantar fasciitis.  No tenderness or synovitis over MTP joints.  CDAI Exam: CDAI Score: -- Patient Global: --; Provider Global: -- Swollen: 2 ; Tender: 7  Joint Exam 09/16/2023      Right  Left  MCP 2      Tender  MCP 3   Tender   Tender  PIP 3 (finger)  Swollen Tender  Swollen Tender  Hip   Tender     Knee      Tender     Investigation: No additional findings.  Imaging: No results found.  Recent Labs: Lab Results  Component Value Date   WBC 8.1 06/15/2023   HGB 16.0 06/15/2023   PLT 224 06/15/2023   NA 139 06/15/2023   K 4.8 06/15/2023   CL 103 06/15/2023   CO2 30 06/15/2023   GLUCOSE 81 06/15/2023   BUN 20 06/15/2023   CREATININE 1.02 06/15/2023   BILITOT 0.6 06/15/2023   ALKPHOS 74 10/24/2016   AST 22 06/15/2023   ALT 21 06/15/2023   PROT 7.2 07/15/2023   ALBUMIN 4.7 10/24/2016   CALCIUM 10.1 06/15/2023    GFRAA 103 08/31/2020   QFTBGOLDPLUS NEGATIVE 03/16/2023    Speciality Comments: Humira, Enbrel12/17-06/18, Cosyntex 06/18-09/20,Taltz  10/20 MTX -high LFTs  Procedures:  No procedures performed Allergies: Patient has no known allergies.      Assessment / Plan:     Visit Diagnoses: Psoriatic arthritis [facility]): He remains on Taltz  80 mg subcutaneous injections once every 28 days.  He is tolerating Taltz  without any side effects or injection site reactions.  His most recent dose of Taltz  was administered yesterday.  About 1 week prior to his Taltz  injection he notices increased arthralgias and joint stiffness.  Over the past several weeks he has noticed decreased grip strength in both hands.  On examination today he has some tenderness and inflammation in the third PIP joint bilaterally.  No evidence of Achilles tendinitis or plantar fasciitis.  No active psoriasis at this time.  Overall his rheumatoid arthritis remains well-controlled on Taltz  as monotherapy.  He continues to find Taltz  to be more effective than any other biologic he has tried in the past.  No medication changes were made at this time.  He is advised to notify us  if he starts to have more breakthrough symptoms prior to being due for his Taltz  injections.  He will follow-up in the office in 3 months or sooner if needed.  Psoriasis: No active psoriasis at this time.   High risk medication use - Taltz  80 mg sq injections every 28 days. Hx of vasectomy. Discontinued arava -no clinical benefit.  CBC and CMP WNL on 06/15/23. Orders for CBC and CMP released today.   TB gold negative on 03/16/23.  No recent or recurrent infections.  Discussed the importance of holding taltz  if he develops signs or symptoms of an infection and to resume  once the infection has completely cleared.   - Plan: CBC with Differential/Platelet, Comprehensive metabolic panel with GFR  Idiopathic chronic gout of multiple sites without tophus -He has not had any signs  or symptoms of a gout flare.  He has clinically been doing well taking allopurinol  300 mg 1 tablet by mouth daily and colchicine  0.6 mg as needed during flares. His uric acid level was 6.5 on 06/17/2023.  Plan to update uric acid level today.- Plan: Uric acid  S/P right rotator cuff repair: Intermittent discomfort.  Good ROM with no discomfort at this time.   Chronic left shoulder pain: Good ROM with no discomfort currently.   Pain in right hip -Chronic pain.  X-rays of bilateral hips were obtained on 03/27/2017: Mild osteoarthritis of right hip and normal joint space of the left hip noted.  Has been experiencing more persistent and severe groin pain involving the right hip.  He has noted some gait changes which he attributes to discomfort in the right hip.  He has intermittent discomfort over the right trochanteric bursa but his symptoms have been better controlled since having a cortisone injection performed on 06/15/2023.  Most of his discomfort seems to be due to the right hip joint itself.  X-rays of the right hip updated today. Plan to place referral to orthocare to discuss possibly proceeding with an intra-articular hip injection.  Plan: XR HIP UNILAT W OR W/O PELVIS 2-3 VIEWS RIGHT  Trochanteric bursitis, right hip: Improved after having a cortisone injection performed on 06/15/2023.  Primary osteoarthritis of both knees: He has good range of motion of both knee joints on examination today.  No warmth or effusion noted.  Cyst palpable behind the left knee.  Patient has had several aspirations of the Baker's cyst in the past but continues to have recurrence.  Baker's cyst of knee, left: Aspiration performed on 06/17/2023.  He had relief for 3 weeks and his symptoms recurred.  Chronic left SI joint pain: He experiences intermittent discomfort in the left SI joint which he attributes to gait changes favoring his right hip.  DDD (degenerative disc disease), cervical - He needs to have chronic neck pain  and stiffness.  He has limited range of motion of the cervical spine with lateral rotation.  Fibromyalgia: Patient continues to experience occasional fibromyalgia flares at which time he experiences increased myofascial pain and fatigue.  About 2 weeks ago he had a flare at which time he slept for most of the day. He remains on Cymbalta as prescribed and has been taking gabapentin  300 mg twice daily which she has found to be helpful at alleviating his symptoms.  Small fiber neuropathy: Biopsy-proven.  Under the care of Dr. Lydia Sams.  His symptoms have been manageable with the use of gabapentin  300 mg twice daily.  A refill of gabapentin  will be sent to the pharmacy today.  Other medical conditions are listed as follows:  History of hypertension: Blood pressure was elevated today in the office and was rechecked prior to leaving.  Patient was advised to monitor blood pressure closely and to return to PCP if his blood pressure remains elevated.  History of sleep apnea   Orders: Orders Placed This Encounter  Procedures   XR HIP UNILAT W OR W/O PELVIS 2-3 VIEWS RIGHT   CBC with Differential/Platelet   Comprehensive metabolic panel with GFR   Uric acid   No orders of the defined types were placed in this encounter.   Follow-Up Instructions: Return in about  3 months (around 12/17/2023) for Psoriatic arthritis.   Romayne Clubs, PA-C  Note - This record has been created using Dragon software.  Chart creation errors have been sought, but may not always  have been located. Such creation errors do not reflect on  the standard of medical care.

## 2023-09-09 ENCOUNTER — Other Ambulatory Visit: Payer: Self-pay | Admitting: Physician Assistant

## 2023-09-15 ENCOUNTER — Other Ambulatory Visit: Payer: Self-pay | Admitting: Physician Assistant

## 2023-09-16 ENCOUNTER — Ambulatory Visit (INDEPENDENT_AMBULATORY_CARE_PROVIDER_SITE_OTHER)

## 2023-09-16 ENCOUNTER — Other Ambulatory Visit: Payer: Self-pay

## 2023-09-16 ENCOUNTER — Encounter: Payer: Self-pay | Admitting: Physician Assistant

## 2023-09-16 ENCOUNTER — Ambulatory Visit: Payer: Self-pay | Admitting: Physician Assistant

## 2023-09-16 ENCOUNTER — Ambulatory Visit: Attending: Physician Assistant | Admitting: Physician Assistant

## 2023-09-16 ENCOUNTER — Other Ambulatory Visit: Payer: Self-pay | Admitting: Physician Assistant

## 2023-09-16 VITALS — BP 152/101 | HR 76 | Resp 15 | Ht 75.0 in | Wt 277.4 lb

## 2023-09-16 DIAGNOSIS — Z8679 Personal history of other diseases of the circulatory system: Secondary | ICD-10-CM

## 2023-09-16 DIAGNOSIS — M797 Fibromyalgia: Secondary | ICD-10-CM

## 2023-09-16 DIAGNOSIS — L4050 Arthropathic psoriasis, unspecified: Secondary | ICD-10-CM | POA: Diagnosis not present

## 2023-09-16 DIAGNOSIS — M1A09X0 Idiopathic chronic gout, multiple sites, without tophus (tophi): Secondary | ICD-10-CM

## 2023-09-16 DIAGNOSIS — M7122 Synovial cyst of popliteal space [Baker], left knee: Secondary | ICD-10-CM

## 2023-09-16 DIAGNOSIS — G8929 Other chronic pain: Secondary | ICD-10-CM

## 2023-09-16 DIAGNOSIS — M5030 Other cervical disc degeneration, unspecified cervical region: Secondary | ICD-10-CM

## 2023-09-16 DIAGNOSIS — L409 Psoriasis, unspecified: Secondary | ICD-10-CM

## 2023-09-16 DIAGNOSIS — M170 Bilateral primary osteoarthritis of knee: Secondary | ICD-10-CM

## 2023-09-16 DIAGNOSIS — Z79899 Other long term (current) drug therapy: Secondary | ICD-10-CM | POA: Diagnosis not present

## 2023-09-16 DIAGNOSIS — Z8669 Personal history of other diseases of the nervous system and sense organs: Secondary | ICD-10-CM

## 2023-09-16 DIAGNOSIS — M25512 Pain in left shoulder: Secondary | ICD-10-CM

## 2023-09-16 DIAGNOSIS — M25551 Pain in right hip: Secondary | ICD-10-CM

## 2023-09-16 DIAGNOSIS — Z9889 Other specified postprocedural states: Secondary | ICD-10-CM

## 2023-09-16 DIAGNOSIS — M533 Sacrococcygeal disorders, not elsewhere classified: Secondary | ICD-10-CM

## 2023-09-16 DIAGNOSIS — M7061 Trochanteric bursitis, right hip: Secondary | ICD-10-CM

## 2023-09-16 DIAGNOSIS — G629 Polyneuropathy, unspecified: Secondary | ICD-10-CM

## 2023-09-16 MED ORDER — GABAPENTIN 300 MG PO CAPS: 300.0000 mg | ORAL_CAPSULE | Freq: Two times a day (BID) | ORAL | 2 refills | Status: DC

## 2023-09-16 NOTE — Telephone Encounter (Signed)
 Please review and sign as discussed at the appointment.

## 2023-09-16 NOTE — Progress Notes (Signed)
 X-rays of the right hip are consistent with mild hip joint narrowing. No radiographic progression noted since 2018   Referral to orthocare was placed.

## 2023-09-16 NOTE — Telephone Encounter (Signed)
 Contacted [facility] pharmacy to advise the change with the patients dose. Pharmacy said they would put it on hold till he was due

## 2023-09-16 NOTE — Patient Instructions (Signed)

## 2023-09-17 LAB — CBC WITH DIFFERENTIAL/PLATELET
Absolute Lymphocytes: 3268 {cells}/uL (ref 850–3900)
Absolute Monocytes: 713 {cells}/uL (ref 200–950)
Basophils Absolute: 48 {cells}/uL (ref 0–200)
Basophils Relative: 0.5 %
Eosinophils Absolute: 228 {cells}/uL (ref 15–500)
Eosinophils Relative: 2.4 %
HCT: 51.4 % — ABNORMAL HIGH (ref 38.5–50.0)
Hemoglobin: 16.9 g/dL (ref 13.2–17.1)
MCH: 31.5 pg (ref 27.0–33.0)
MCHC: 32.9 g/dL (ref 32.0–36.0)
MCV: 95.9 fL (ref 80.0–100.0)
MPV: 9.8 fL (ref 7.5–12.5)
Monocytes Relative: 7.5 %
Neutro Abs: 5244 {cells}/uL (ref 1500–7800)
Neutrophils Relative %: 55.2 %
Platelets: 238 10*3/uL (ref 140–400)
RBC: 5.36 10*6/uL (ref 4.20–5.80)
RDW: 13.1 % (ref 11.0–15.0)
Total Lymphocyte: 34.4 %
WBC: 9.5 10*3/uL (ref 3.8–10.8)

## 2023-09-17 LAB — COMPREHENSIVE METABOLIC PANEL WITH GFR
AG Ratio: 2.3 (calc) (ref 1.0–2.5)
ALT: 25 U/L (ref 9–46)
AST: 24 U/L (ref 10–40)
Albumin: 5.3 g/dL — ABNORMAL HIGH (ref 3.6–5.1)
Alkaline phosphatase (APISO): 63 U/L (ref 36–130)
BUN/Creatinine Ratio: 13 (calc) (ref 6–22)
BUN: 17 mg/dL (ref 7–25)
CO2: 29 mmol/L (ref 20–32)
Calcium: 10.8 mg/dL — ABNORMAL HIGH (ref 8.6–10.3)
Chloride: 100 mmol/L (ref 98–110)
Creat: 1.35 mg/dL — ABNORMAL HIGH (ref 0.60–1.29)
Globulin: 2.3 g/dL (ref 1.9–3.7)
Glucose, Bld: 89 mg/dL (ref 65–99)
Potassium: 4.4 mmol/L (ref 3.5–5.3)
Sodium: 139 mmol/L (ref 135–146)
Total Bilirubin: 1.3 mg/dL — ABNORMAL HIGH (ref 0.2–1.2)
Total Protein: 7.6 g/dL (ref 6.1–8.1)
eGFR: 68 mL/min/{1.73_m2} (ref >=60)

## 2023-09-17 LAB — URIC ACID: Uric Acid, Serum: 6.3 mg/dL (ref 4.0–8.0)

## 2023-09-17 NOTE — Progress Notes (Signed)
 Uric acid--6.3.  recommend continuing dose of allopurinol  and avoiding a purine rich diet.   Ideally uric acid should be less than 6 to minimize risk for a flare.  CBC stable.  Creatinine is elevated-1.35 and GFR 68--please clarify if he has been taking any NSAIDs.  Calcium is elevated-10.8-avoid calcium and vitamin D supplement

## 2023-09-25 ENCOUNTER — Ambulatory Visit: Admitting: Physician Assistant

## 2023-09-25 ENCOUNTER — Encounter: Payer: Self-pay | Admitting: Sports Medicine

## 2023-09-25 ENCOUNTER — Ambulatory Visit (INDEPENDENT_AMBULATORY_CARE_PROVIDER_SITE_OTHER): Admitting: Sports Medicine

## 2023-09-25 ENCOUNTER — Other Ambulatory Visit: Payer: Self-pay

## 2023-09-25 DIAGNOSIS — M25551 Pain in right hip: Secondary | ICD-10-CM | POA: Diagnosis not present

## 2023-09-25 DIAGNOSIS — M1611 Unilateral primary osteoarthritis, right hip: Secondary | ICD-10-CM | POA: Diagnosis not present

## 2023-09-25 MED ORDER — METHYLPREDNISOLONE ACETATE 40 MG/ML IJ SUSP
80.0000 mg | INTRAMUSCULAR | Status: AC | PRN
  Administered 2023-09-25: 80 mg via INTRA_ARTICULAR

## 2023-09-25 MED ORDER — LIDOCAINE HCL 1 % IJ SOLN
4.0000 mL | INTRAMUSCULAR | Status: AC | PRN
Start: 2023-09-25 — End: 2023-09-25
  Administered 2023-09-25: 4 mL

## 2023-09-25 NOTE — Progress Notes (Signed)
   Procedure Note  Patient: Miguel Evans             Date of Birth: Dec 20, 1980           MRN: [id number]             Visit Date: 09/25/2023  Procedures: Visit Diagnoses:  1. Pain in right hip   2. Unilateral primary osteoarthritis, right hip    Large Joint Inj: R hip joint on 09/25/2023 11:20 AM Indications: pain Details: 22 G 3.5 in needle, ultrasound-guided anterior approach Medications: 4 mL lidocaine  1 %; 80 mg methylPREDNISolone  acetate 40 MG/ML Outcome: tolerated well, no immediate complications  Procedure: US -guided intra-articular hip injection, Right After discussion on risks/benefits/indications and informed verbal consent was obtained, a timeout was performed. Patient was lying supine on exam table. The hip was cleaned with betadine  and alcohol swabs . Then utilizing ultrasound guidance, the patient's femoral head and neck junction was identified and subsequently injected with 4:2 lidocaine :depomedrol via an in-plane approach with ultrasound visualization of the injectate administered into the hip joint. Patient tolerated procedure well without immediate complications.  Procedure, treatment alternatives, risks and benefits explained, specific risks discussed. Consent was given by the patient. Immediately prior to procedure a time out was called to verify the correct patient, procedure, equipment, support staff and site/side marked as required. Patient was prepped and draped in the usual sterile fashion.     - patient tolerated procedure well, discussed post-injection protocol - follow-up with Lyndol Santee as indicated; I am happy to see them as needed  Shauna Del, DO Primary Care Sports Medicine Physician  [facility] - Orthoped[facility]  This note was dictated using Dragon naturally speaking software and may contain errors in syntax, spelling, or content which have not been identified prior to signing this note.

## 2023-09-25 NOTE — Progress Notes (Signed)
 Office Visit Note   Patient: Miguel Evans           Date of Birth: November 23, 1980           MRN: [id number] Visit Date: 09/25/2023              Requested by: Romayne Clubs, PA-C [address] [place],  [place] [address] PCP: Lucius Sabins., MD   Assessment & Plan: Visit Diagnoses:  1. Pain in right hip     Plan: Impression is right hip osteoarthritis with possible labral component.  We have discussed referral to Dr. Vaughn Georges for ultrasound-guided cortisone injection.  He will follow-up with us  as needed.  Call with concerns or questions.  Follow-Up Instructions: Return if symptoms worsen or fail to improve.   Orders:  No orders of the defined types were placed in this encounter.  No orders of the defined types were placed in this encounter.     Procedures: No procedures performed   Clinical Data: No additional findings.   Subjective: Chief Complaint  Patient presents with   Right Hip - Pain    HPI patient is a pleasant 43 year old gentleman who comes in today for evaluation of his right hip pain.  Pain has been ongoing for 2 years.  He denies any injury.  He does note he has had a bad left knee and has had to favor this for several years.  The pain he has in the right hip is all to the groin.  Pain is constant but worse when he is walking, climbing or when he is going to a seated to standing position where he has associated stiffness.  He also notes locking catching to the right hip.  He has been taking Aleve without significant relief.  He denies any paresthesias to right lower extremity.  He does have a history of trochanteric bursitis and has had a cortisone injection to the right trochanteric bursa earlier in the year.  He does have a history of psoriatic arthritis, gout and fibromyalgia.  No previous cortisone injection to the right hip joint.  Review of Systems as detailed in HPI.  All others reviewed and are negative.   Objective: Vital Signs: There were no  vitals taken for this visit.  Physical Exam well-developed well-nourished gentleman in no acute distress.  Alert and oriented x 3.  Ortho Exam right hip exam: Markedly positive logroll, and FADIR and Stinchfield testing.  He is neurovascularly intact distally.  Specialty Comments:  No specialty comments available.  Imaging: X-rays reviewed by me in canopy show moderate degenerative changes to the right hip joint   PMFS History: Patient Active Problem List   Diagnosis Date Noted   Cervical disc disorder with radiculopathy 02/22/2019   Myofascial pain syndrome 02/22/2019   Tear of right rotator cuff 09/23/2018   Psoriasis 03/27/2017   Polyarthritis 02/24/2017   Patellofemoral arthralgia of both knees 02/24/2017   Hyperuricemia 03/26/2016   Elevated blood pressure  03/26/2016   Elevated LFTs 03/26/2016   Anxiety 03/26/2016   Psoriatic arthritis (HCC) 03/25/2016   High risk medication use 03/25/2016   Cervicalgia 03/25/2016   Hand pain 03/25/2016   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   Past Medical History:  Diagnosis Date   Anxiety    Arthritis    Gout    Hypertension    Psoriasis    Psoriatic arthritis (HCC)    Sleep apnea    Small fiber neuropathy 08/2023  Family History  Problem Relation Age of Onset   Cancer Mother        breast    Bipolar disorder Mother    Hypertension Father    Osteoarthritis Father    Emphysema Maternal Grandfather    CAD Paternal Grandfather    Healthy Son    Healthy Son    Healthy Daughter    Healthy Daughter     Past Surgical History:  Procedure Laterality Date   ANTERIOR CRUCIATE LIGAMENT REPAIR Bilateral Left in 2002, Right 2013   ANTERIOR CRUCIATE LIGAMENT REPAIR Bilateral 2014   ROTATOR CUFF REPAIR Left 06/05/2020   SHOULDER ARTHROSCOPY WITH SUBACROMIAL DECOMPRESSION AND OPEN ROTATOR C Right 09/23/2018   Procedure: SHOULDER ARTHROSCOPY WITH SUBACROMIAL DECOMPRESSION AND OPEN ROTATOR CUFF REPAIR,;  Surgeon: Shirlee Dotter, MD;  Location: [facility];  Service: Orthopedics;  Laterality: Right;  WITH BLOCK   VASECTOMY     Social History   Occupational History   Not on file  Tobacco Use   Smoking status: Former    Current packs/day: 0.00    Average packs/day: 0.3 packs/day for 5.0 years (1.3 ttl pk-yrs)    Types: Cigarettes    Start date: 03/28/2007    Quit date: 03/27/2012    Years since quitting: 11.5    Passive exposure: Past   Smokeless tobacco: Former  Building services engineer status: Never Used  Substance and Sexual Activity   Alcohol use: Not Currently   Drug use: No   Sexual activity: Yes    Birth control/protection: Condom

## 2023-11-04 ENCOUNTER — Other Ambulatory Visit: Payer: Self-pay | Admitting: Physician Assistant

## 2023-11-04 DIAGNOSIS — M1A09X0 Idiopathic chronic gout, multiple sites, without tophus (tophi): Secondary | ICD-10-CM

## 2023-11-04 NOTE — Telephone Encounter (Signed)
 Last Fill: 08/11/2023  Labs: 09/16/2023 Uric acid--6.3.  recommend continuing dose of allopurinol  and avoiding a purine rich diet.   Ideally uric acid should be less than 6 to minimize risk for a flare.  CBC stable.  Creatinine is elevated-1.35 and GFR 68--please clarify if he has been taking any NSAIDs.  Calcium is elevated-10.8-avoid calcium and vitamin D supplement   Next Visit: 12/17/2023  Last Visit: 09/16/2023  DX: Psoriatic arthritis   Current Dose per office note 09/16/2023: allopurinol  300 mg 1 tablet by mouth daily.   Okay to refill Allopurinol ?

## 2023-11-08 ENCOUNTER — Other Ambulatory Visit: Payer: Self-pay | Admitting: Physician Assistant

## 2023-11-08 DIAGNOSIS — L4050 Arthropathic psoriasis, unspecified: Secondary | ICD-10-CM

## 2023-11-08 DIAGNOSIS — L409 Psoriasis, unspecified: Secondary | ICD-10-CM

## 2023-11-08 DIAGNOSIS — Z79899 Other long term (current) drug therapy: Secondary | ICD-10-CM

## 2023-11-09 NOTE — Telephone Encounter (Signed)
 Last Fill: 07/30/2023  Labs: 09/16/2023  Uric acid--6.3.  recommend continuing dose of allopurinol  and avoiding a purine rich diet.   Ideally uric acid should be less than 6 to minimize risk for a flare.  CBC stable.  Creatinine is elevated-1.35 and GFR 68--please clarify if he has been taking any NSAIDs.  Calcium is elevated-10.8-avoid calcium and vitamin D supplement   TB Gold: 03/16/2023 TB Gold Negative.    Next Visit: 12/17/2023  Last Visit: 09/16/2023  DX: Psoriatic arthritis   Current Dose per office note 09/16/2023: Taltz  80 mg sq injections every 28 days   Okay to refill Taltz ?

## 2023-11-25 ENCOUNTER — Encounter: Payer: Self-pay | Admitting: Neurology

## 2023-11-25 ENCOUNTER — Ambulatory Visit: Admitting: Neurology

## 2023-11-25 VITALS — BP 137/90 | HR 78 | Ht 75.0 in | Wt 277.0 lb

## 2023-11-25 DIAGNOSIS — M25522 Pain in left elbow: Secondary | ICD-10-CM | POA: Diagnosis not present

## 2023-11-25 DIAGNOSIS — G629 Polyneuropathy, unspecified: Secondary | ICD-10-CM | POA: Diagnosis not present

## 2023-11-25 DIAGNOSIS — G5602 Carpal tunnel syndrome, left upper limb: Secondary | ICD-10-CM

## 2023-11-25 NOTE — Progress Notes (Signed)
 Follow-up Visit   Date: 11/25/2023    Miguel Evans MRN: [id number] DOB: 05-Jul-1980    Miguel Evans is a 43 y.o. right-handed Caucasian male with psoriatic arthritis, fibromyalgia, hypertension, and gout returning to the clinic for follow-up of small fiber neuropathy.  The patient was accompanied to the clinic by self.   IMPRESSION/PLAN: Small fiber neuropathy (biopsy proven). Risk factors include autoimmune disease (psoriatic arthritis).  For pain, continue gabapentin  300mg  twice daily.  If mid-day symptoms continue to be bothersome, we can add gabapentin  100mg  around 2pm. Left elbow pain seems more musculoskeletal.  He may try NSAIDs and ice.  If no improvement, then follow-up with PCP and we can consider NCS/EMG to be sure there is no ulnar nerve entrapment.  Left carpal tunnel syndrome, stable.   Symptoms are not bothersome at this time.   Return to clinic in 6 months  --------------------------------------------- History of present illness: He was seen in 2020 for left arm paresthesias.  EDX showed left carpal tunnel syndrome (mild).  Early 2024, his numbness/tingling started getting worse and now involves both arm (from the shoulder into the hands).  He has occasional heaviness, worse on the left. He has vibration sensation in both hands.  Arm symptoms are intermittent and variable.  Activity makes his symptoms worse.  He complains of neck pain.  He previously did neck PT about 2 years ago, with no benefit.  He takes gabapentin  100mg  in the morning and 300mg  at bedtime.     He has burning and stinging pain involving the middle three toes, which is constant.  He has low back pain and weakness in both legs.  He has fallen several times last year.    He does not work.  He last working in March 2023.  Nonsmoker.  He drinks alcohol 12-pack beer over the weekend.    UPDATE 07/22/2023:  He is here for follow-up visit.  His skin biopsy show findings of small fiber neuropathy.  He is taking  gabapentin  300mg  at bedtime which helps nighttime symptoms, however, he does not get any relief during the daytime.  He was started on gabapentin  100mg  in the morning which he stopped due to no improvement.  He did not tolerate 300mg  in the morning.  No new complaints.   UPDATE 11/25/2023:  He takes gabapentin  300mg  twice daily without side effects. He is getting relief with morning and night time symptoms, but reports that around 4pm, he starts to have burning and tingling in the feet.   He does not feel that symptoms are severe enough to take a mid-day dose.    He also complains of left medial forearm and elbow pain for the past three weeks. It is worse when he flexes the elbow.  He denies numbness/tingling of the forearm.  He has mild numbness/tingling in the fingers.  He had moderate carpal tunnel syndrome, which is not bothersome.  He is not using a brace for this.      Medications:  Current Outpatient Medications on File Prior to Visit  Medication Sig Dispense Refill   allopurinol  (ZYLOPRIM ) 300 MG tablet TAKE 1 TABLET DAILY 90 tablet 0   ALPRAZolam (XANAX) 0.5 MG tablet Take 0.5 mg by mouth 3 (three) times daily as needed.     amLODipine (NORVASC) 5 MG tablet Take 5 mg by mouth daily.     colchicine  0.6 MG tablet Take 1 tablet (0.6 mg total) by mouth daily. (Patient taking differently: Take 0.6 mg by mouth as  needed.) 90 tablet 0   COLLAGEN PO Take by mouth.     DULoxetine (CYMBALTA) 60 MG capsule Take 60 mg by mouth daily.     gabapentin  (NEURONTIN ) 300 MG capsule Take 1 capsule (300 mg total) by mouth 2 (two) times daily. 60 capsule 2   glucosamine-chondroitin 500-400 MG tablet Take 1 tablet by mouth daily.     metoprolol tartrate (LOPRESSOR) 25 MG tablet Take 25 mg by mouth 2 (two) times daily.     Multiple Vitamin (MULTIVITAMIN) capsule Take 1 capsule by mouth daily.     Omega-3 Fatty Acids (FISH OIL) 1000 MG CAPS Take 1,000 mg by mouth daily.      TALTZ  80 MG/ML pen INJECT 1 ML (80 MG  TOTAL) UNDER THE SKIN EVERY 28 DAYS 3 mL 0   testosterone  cypionate (DEPOTESTOSTERONE CYPIONATE) 200 MG/ML injection Inject into the muscle every 14 (fourteen) days.     lisinopril (PRINIVIL,ZESTRIL) 20 MG tablet Take 20 mg by mouth daily.      No current facility-administered medications on file prior to visit.    Allergies: No Known Allergies  Vital Signs:  BP (!) 137/90   Pulse 78   Ht 6' 3 (1.905 m)   Wt 277 lb (125.6 kg)   SpO2 100%   BMI 34.62 kg/m    Neurological Exam: MENTAL STATUS including orientation to time, place, person, recent and remote memory, attention span and concentration, language, and fund of knowledge is normal.  Speech is not dysarthric.  CRANIAL NERVES:    Pupils equal round and reactive to light.  Normal conjugate, extra-ocular eye movements in all directions of gaze.  Trace left ptosis.  Face is symmetric. Palate elevates symmetrically.  Tongue is midline.  MOTOR:  Motor strength is 5/5 in all extremities.  No atrophy, fasciculations or abnormal movements.  No pronator drift.  Tone is normal.    MSRs:  Reflexes are 2+/4 throughout.  SENSORY:  Intact to vibration above the knees, mildly reduced at the ankles.    COORDINATION/GAIT:  Normal finger-to- nose-finger.  Intact rapid alternating movements bilaterally.  Gait is mildly antalgic due to left leg pain, unassisted and stable  Data: Skin biopsy 06/16/2023:  Significantly reduced epidermal nerve fiber density at the calf consistent with small fiber neuropathy  NCS/EMG of the left side 05/22/2023: Left median neuropathy at or distal to the wrist, consistent with a clinical diagnosis of carpal tunnel syndrome.  Overall, these findings are moderate in degree electrically and has progressed compared to prior study on 04/06/2019.   In particular, there is no evidence of a cervical/lumbosacral radiculopathy or sensorimotor polyneuropathy affecting the left side.    Thank you for allowing me to participate  in patient's care.  If I can answer any additional questions, I would be pleased to do so.    Sincerely,    Maelys Kinnick K. Tobie, DO

## 2023-12-01 DIAGNOSIS — Z79899 Other long term (current) drug therapy: Secondary | ICD-10-CM | POA: Diagnosis not present

## 2023-12-01 DIAGNOSIS — E291 Testicular hypofunction: Secondary | ICD-10-CM | POA: Diagnosis not present

## 2023-12-01 DIAGNOSIS — F411 Generalized anxiety disorder: Secondary | ICD-10-CM | POA: Diagnosis not present

## 2023-12-01 DIAGNOSIS — I10 Essential (primary) hypertension: Secondary | ICD-10-CM | POA: Diagnosis not present

## 2023-12-01 DIAGNOSIS — E785 Hyperlipidemia, unspecified: Secondary | ICD-10-CM | POA: Diagnosis not present

## 2023-12-03 NOTE — Progress Notes (Unsigned)
 Office Visit Note  Patient: Miguel Evans             Date of Birth: 12-25-1980           MRN: [id number]             PCP: Sabas Norleen PARAS., MD Referring: Sabas Norleen PARAS., MD Visit Date: 12/17/2023 Occupation: @GUAROCC @  Subjective:  Pain in multiple joints  History of Present Illness: Miguel Evans is a 43 y.o. male with history of psoriatic arthritis and gout.  Patient remains on  Taltz  80 mg sq injections every 28 days.  His most recent dose of Taltz  was administered on 12/14/2023.  He has been tolerating Taltz  without any side effects or injection site reactions.  Patient continues to experience breakthrough symptoms at about 3-1/2 weeks at which time he is experiencing increased pain and inflammation involving multiple joints.  He has been stretching in the morning and in the evenings as well as has been trying to go to the gym on a regular basis.  He has remained on Cymbalta and gabapentin  as prescribed.  Overall he has been sleeping well at night using the CPAP.  Patient states that the last his disability and has been under a significant amount of stress during the month of August.  Patient states that he has gained about 10 pounds due to the stress. He continues to have persistent pain in the right hip and left knee joint which affects his gait at times.  He had a right intra-articular hip injection performed on 09/25/2023 which provided minimal to no relief.  He has not yet proceeded with an MRI for further evaluation due to concern for cost. He denies any signs or symptoms of a gout flare.  He has remained on allopurinol  as prescribed. He denies any recent or recurrent infections.   Activities of Daily Living:  Patient reports morning stiffness for 1 hour.   Patient Reports nocturnal pain.  Difficulty dressing/grooming: Reports Difficulty climbing stairs: Reports Difficulty getting out of chair: Reports Difficulty using hands for taps, buttons, cutlery, and/or writing: Reports  Review  of Systems  Constitutional:  Positive for fatigue.  HENT:  Positive for mouth dryness. Negative for mouth sores.   Eyes:  Positive for dryness.  Respiratory:  Negative for shortness of breath.   Cardiovascular:  Positive for palpitations.  Gastrointestinal:  Positive for constipation and diarrhea.  Endocrine: Negative for increased urination.  Genitourinary:  Negative for involuntary urination.  Musculoskeletal:  Positive for joint pain, gait problem, joint pain, joint swelling, myalgias, muscle weakness, morning stiffness, muscle tenderness and myalgias.  Skin:  Negative for color change, rash, hair loss and sensitivity to sunlight.  Allergic/Immunologic: Positive for susceptible to infections.  Neurological:  Positive for dizziness and headaches.  Hematological:  Negative for swollen glands.  Psychiatric/Behavioral:  Positive for depressed mood and sleep disturbance. The patient is nervous/anxious.     PMFS History:  Patient Active Problem List   Diagnosis Date Noted   Cervical disc disorder with radiculopathy 02/22/2019   Myofascial pain syndrome 02/22/2019   Tear of right rotator cuff 09/23/2018   Psoriasis 03/27/2017   Polyarthritis 02/24/2017   Patellofemoral arthralgia of both knees 02/24/2017   Hyperuricemia 03/26/2016   Elevated blood pressure  03/26/2016   Elevated LFTs 03/26/2016   Anxiety 03/26/2016   Psoriatic arthritis (HCC) 03/25/2016   High risk medication use 03/25/2016   Cervicalgia 03/25/2016   Hand pain 03/25/2016   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  Past Medical History:  Diagnosis Date   Anxiety    Arthritis    Gout    Hypertension    Psoriasis    Psoriatic arthritis (HCC)    Sleep apnea    Small fiber neuropathy 08/2023    Family History  Problem Relation Age of Onset   Cancer Mother        breast    Bipolar disorder Mother    Hypertension Father    Osteoarthritis Father    Emphysema Maternal Grandfather    CAD Paternal Grandfather     Healthy Son    Healthy Son    Healthy Daughter    Healthy Daughter    Past Surgical History:  Procedure Laterality Date   ANTERIOR CRUCIATE LIGAMENT REPAIR Bilateral Left in 2002, Right 2013   ANTERIOR CRUCIATE LIGAMENT REPAIR Bilateral 2014   ROTATOR CUFF REPAIR Left 06/05/2020   SHOULDER ARTHROSCOPY WITH SUBACROMIAL DECOMPRESSION AND OPEN ROTATOR C Right 09/23/2018   Procedure: SHOULDER ARTHROSCOPY WITH SUBACROMIAL DECOMPRESSION AND OPEN ROTATOR CUFF REPAIR,;  Surgeon: Anderson Maude ORN, MD;  Location: [facility];  Service: Orthopedics;  Laterality: Right;  WITH BLOCK   VASECTOMY     Social History   Social History Narrative   Right handed   4 children   One story home         Are you right handed or left handed? Right Handed   Are you currently employed ? No   What is your current occupation?   Do you live at home alone? No    Who lives with you? Spouse and two children    What type of home do you live in: 1 story or 2 story? Lives in a one story home        There is no immunization history on file for this patient.   Objective: Vital Signs: BP 122/85 (BP Location: Left Arm, Patient Position: Sitting, Cuff Size: Large)   Pulse 71   Resp 12   Ht 6' 2.5 (1.892 m)   Wt 282 lb 6.4 oz (128.1 kg)   BMI 35.77 kg/m    Physical Exam Vitals and nursing note reviewed.  Constitutional:      Appearance: He is well-developed.  HENT:     Head: Normocephalic and atraumatic.  Eyes:     Conjunctiva/sclera: Conjunctivae normal.     Pupils: Pupils are equal, round, and reactive to light.  Cardiovascular:     Rate and Rhythm: Normal rate and regular rhythm.     Heart sounds: Normal heart sounds.  Pulmonary:     Effort: Pulmonary effort is normal.     Breath sounds: Normal breath sounds.  Abdominal:     General: Bowel sounds are normal.     Palpations: Abdomen is soft.  Musculoskeletal:     Cervical back: Normal range of motion and neck supple.  Skin:    General: Skin is  warm and dry.     Capillary Refill: Capillary refill takes less than 2 seconds.  Neurological:     Mental Status: He is alert and oriented to person, place, and time.  Psychiatric:        Behavior: Behavior normal.      Musculoskeletal Exam: C-spine has limited range of motion bilateral rotation.  Tenderness of the left SI joint.  No midline spinal tenderness.  Shoulder joints have stiffness with range of motion.  Elbow joints have good range of motion with some tenderness over the medial epicondyle bilaterally.  Right  wrist has limited flexion and extension.  Tenderness and inflammation noted in the right third PIP joint.  Tenderness of the left 2nd and 3rd MCP joints and the left third PIP joint.  Painful limited range of motion of the right hip.  Tenderness over the trochanteric bursa of both hips.  Discomfort with range of motion of the left knee.  Ankle joints have good range of motion with no synovitis.  No evidence of Achilles tendinitis or plantar fasciitis.  CDAI Exam: CDAI Score: -- Patient Global: --; Provider Global: -- Swollen: --; Tender: -- Joint Exam 12/17/2023   No joint exam has been documented for this visit   There is currently no information documented on the homunculus. Go to the Rheumatology activity and complete the homunculus joint exam.  Investigation: No additional findings.  Imaging: No results found.  Recent Labs: Lab Results  Component Value Date   WBC 9.5 09/16/2023   HGB 16.9 09/16/2023   PLT 238 09/16/2023   NA 139 09/16/2023   K 4.4 09/16/2023   CL 100 09/16/2023   CO2 29 09/16/2023   GLUCOSE 89 09/16/2023   BUN 17 09/16/2023   CREATININE 1.35 (H) 09/16/2023   BILITOT 1.3 (H) 09/16/2023   ALKPHOS 74 10/24/2016   AST 24 09/16/2023   ALT 25 09/16/2023   PROT 7.6 09/16/2023   ALBUMIN 4.7 10/24/2016   CALCIUM 10.8 (H) 09/16/2023   GFRAA 103 08/31/2020   QFTBGOLDPLUS NEGATIVE 03/16/2023    Speciality Comments: Humira, Enbrel12/17-06/18,  Cosyntex 06/18-09/20,Taltz  10/20 MTX -high LFTs  Procedures:  Large Joint Inj: L knee on 12/17/2023 11:14 AM Indications: pain Details: 27 G 1.5 in needle, medial approach  Arthrogram: No  Medications: 1.5 mL lidocaine  1 %; 40 mg triamcinolone  acetonide 40 MG/ML Aspirate: 0 mL Outcome: tolerated well, no immediate complications Procedure, treatment alternatives, risks and benefits explained, specific risks discussed. Consent was given by the patient. Immediately prior to procedure a time out was called to verify the correct patient, procedure, equipment, support staff and site/side marked as required. Patient was prepped and draped in the usual sterile fashion.     Allergies: Patient has no known allergies.   Assessment / Plan:     Visit Diagnoses: Psoriatic arthritis [facility]): Patient remains on Taltz  80 mg subcu days injections once every 28 days.  He is tolerating Taltz  without any side effects or injection site reactions.  He has not had any recent gaps in therapy.  His most recent dose of Taltz  was administered on 12/14/2023.  He continues to experience some breakthrough symptoms about 5 days prior to administering Taltz .  He has continued to have chronic pain in both hands and has difficulty grasping at times due to severity of symptoms.  On examination he has tenderness and mild inflammation in the right third PIP joint.  He continues to have persistent tenderness at the left SI joint.  No evidence of Achilles tendinitis or plantar fasciitis.  He continues to have chronic pain in the right hip and left knee joint which limits his mobility. To alleviate his left knee joint pain a cortisone injection was performed today.  He tolerated procedure well.  Procedure note was completed above.  He was advised to notify us  if his symptoms persist or worsen.  He is considering following back up with orthopedics for further evaluation and management of the right hip pain since the intra-articular hip  injection on 09/25/2023 did not alleviate his symptoms. He has tried to remain active stretching  in the mornings in the evenings as well as going to the gym on a regular basis to maintain his strength and range of motion. He will remain on Taltz  as monotherapy for now.  He was advised to notify us  if he develops any new or worsening symptoms.  He will continue to follow-up closely every 3 months.  Psoriasis: He has no active psoriasis at this time.  High risk medication use -Taltz  80 mg sq injections every 28 days. Hx of vasectomy. Discontinued arava -no clinical benefit.  CBC and CMP updated on 09/16/23. Orders for CBC and CMP released today.  TB gold negative on 03/16/23 Discussed the importance of holding taltz  if he develops signs or symptoms of an infection and to resume once the infection has completely cleared.   - Plan: CBC with Differential/Platelet, Comprehensive metabolic panel with GFR  Idiopathic chronic gout of multiple sites without tophus - He has not had any signs or symptoms of a gout flare.  He has clinically been doing well taking allopurinol  300 mg 1 tablet by mouth daily and colchicine  0.6 mg as needed during flares. Uric acid was 6.3 on 09/16/2023.  Plan to update uric acid with next lab work in 3 months.  Pain in right hip - Chronic pain, limits mobility.  Patient had a right trochanteric bursa cortisone injection performed on 06/15/2023.  He had a right intra-articular hip injection performed on 09/25/2023.  He has had minimal to no relief and is planning to follow back up with orthopedics.  He has not yet had an MRI due to the concern for cost.  He has been trying to remain active stretching on a daily basis and going to the gym to help improve his mobility but his pain and stiffness persist. He has limited and painful ROM of the right hip on examination today.   S/P right rotator cuff repair: Stiffness with range of motion.  Chronic left shoulder pain: Stiffness with range of  motion.  Primary osteoarthritis of both knees: Patient continues to have chronic pain in both knees, left worse than right.  Different treatment options were discussed today.  He requested a left knee joint cortisone injection today.  He tolerated procedure well.  Procedure note was completed above.  Aftercare was discussed.  He was advised to notify us  if his symptoms persist or worsen.  Chronic pain of left knee: Patient presents today with increased pain and stiffness in the left knee.  His left knee joint pain limits his mobility.  He has difficulty rising from a seated position as well as climbing steps. Different treatment options were discussed today.  He requested a left knee joint cortisone injection.  He tolerated the procedure well.  Procedure note was completed above.  Aftercare was discussed.  Baker's cyst of knee, left - Aspiration performed on 06/17/2023.  Chronic left SI joint pain: Patient continues to have persistent tenderness of the left SI joint.  Trochanteric bursitis, right hip: Patient had a right trochanteric bursa cortisone injection performed on 03/31/2022 and again on 06/15/2023.  He experiences discomfort at night when laying on his sides and asked to change positions for relief.  He has been trying to perform stretching exercises in the morning and in the evening to help alleviate his symptoms.  DDD (degenerative disc disease), cervical: C-spine has discomfort and stiffness with lateral rotation.  Fibromyalgia -He continues to experience intermittent bouts of myofascial pain.  He has been taking gabapentin  300 mg twice daily and Cymbalta 60  mg daily as prescribed.  He has been trying to go to the gym on a regular basis for exercise.    Other medical conditions are listed as follows:  History of hypertension: Blood pressure is 122/85 today in the office.  Patient was advised to monitor blood pressure closely following a cortisone injection today.  History of sleep  apnea  Small fiber neuropathy - Biopsy-proven.  Under the care of Dr. Tobie.  He is taking gabapentin  300 mg twice daily.  Orders: Orders Placed This Encounter  Procedures   Large Joint Inj   CBC with Differential/Platelet   Comprehensive metabolic panel with GFR   No orders of the defined types were placed in this encounter.    Follow-Up Instructions: Return in about 3 months (around 03/17/2024) for Psoriatic arthritis.   Waddell CHRISTELLA Craze, PA-C  Note - This record has been created using Dragon software.  Chart creation errors have been sought, but may not always  have been located. Such creation errors do not reflect on  the standard of medical care.

## 2023-12-17 ENCOUNTER — Encounter: Payer: Self-pay | Admitting: Physician Assistant

## 2023-12-17 ENCOUNTER — Ambulatory Visit: Attending: Physician Assistant | Admitting: Physician Assistant

## 2023-12-17 VITALS — BP 122/85 | HR 71 | Resp 12 | Ht 74.5 in | Wt 282.4 lb

## 2023-12-17 DIAGNOSIS — M5030 Other cervical disc degeneration, unspecified cervical region: Secondary | ICD-10-CM

## 2023-12-17 DIAGNOSIS — G629 Polyneuropathy, unspecified: Secondary | ICD-10-CM

## 2023-12-17 DIAGNOSIS — M7061 Trochanteric bursitis, right hip: Secondary | ICD-10-CM

## 2023-12-17 DIAGNOSIS — M25562 Pain in left knee: Secondary | ICD-10-CM

## 2023-12-17 DIAGNOSIS — M25551 Pain in right hip: Secondary | ICD-10-CM

## 2023-12-17 DIAGNOSIS — G8929 Other chronic pain: Secondary | ICD-10-CM

## 2023-12-17 DIAGNOSIS — M7122 Synovial cyst of popliteal space [Baker], left knee: Secondary | ICD-10-CM

## 2023-12-17 DIAGNOSIS — Z79899 Other long term (current) drug therapy: Secondary | ICD-10-CM

## 2023-12-17 DIAGNOSIS — M797 Fibromyalgia: Secondary | ICD-10-CM

## 2023-12-17 DIAGNOSIS — L4050 Arthropathic psoriasis, unspecified: Secondary | ICD-10-CM

## 2023-12-17 DIAGNOSIS — Z8679 Personal history of other diseases of the circulatory system: Secondary | ICD-10-CM

## 2023-12-17 DIAGNOSIS — M533 Sacrococcygeal disorders, not elsewhere classified: Secondary | ICD-10-CM

## 2023-12-17 DIAGNOSIS — M1A09X0 Idiopathic chronic gout, multiple sites, without tophus (tophi): Secondary | ICD-10-CM

## 2023-12-17 DIAGNOSIS — L409 Psoriasis, unspecified: Secondary | ICD-10-CM | POA: Diagnosis not present

## 2023-12-17 DIAGNOSIS — Z8669 Personal history of other diseases of the nervous system and sense organs: Secondary | ICD-10-CM

## 2023-12-17 DIAGNOSIS — M170 Bilateral primary osteoarthritis of knee: Secondary | ICD-10-CM

## 2023-12-17 DIAGNOSIS — M25512 Pain in left shoulder: Secondary | ICD-10-CM

## 2023-12-17 DIAGNOSIS — Z9889 Other specified postprocedural states: Secondary | ICD-10-CM

## 2023-12-17 MED ORDER — LIDOCAINE HCL 1 % IJ SOLN
1.5000 mL | INTRAMUSCULAR | Status: AC | PRN
  Administered 2023-12-17: 1.5 mL

## 2023-12-17 MED ORDER — TRIAMCINOLONE ACETONIDE 40 MG/ML IJ SUSP
40.0000 mg | INTRAMUSCULAR | Status: AC | PRN
  Administered 2023-12-17: 40 mg via INTRA_ARTICULAR

## 2023-12-17 NOTE — Patient Instructions (Signed)

## 2023-12-18 ENCOUNTER — Ambulatory Visit: Payer: Self-pay | Admitting: Physician Assistant

## 2023-12-18 LAB — CBC WITH DIFFERENTIAL/PLATELET
Absolute Lymphocytes: 2534 {cells}/uL (ref 850–3900)
Absolute Monocytes: 531 {cells}/uL (ref 200–950)
Basophils Absolute: 38 {cells}/uL (ref 0–200)
Basophils Relative: 0.6 %
Eosinophils Absolute: 250 {cells}/uL (ref 15–500)
Eosinophils Relative: 3.9 %
HCT: 49.4 % (ref 38.5–50.0)
Hemoglobin: 16.4 g/dL (ref 13.2–17.1)
MCH: 32.1 pg (ref 27.0–33.0)
MCHC: 33.2 g/dL (ref 32.0–36.0)
MCV: 96.7 fL (ref 80.0–100.0)
MPV: 9.7 fL (ref 7.5–12.5)
Monocytes Relative: 8.3 %
Neutro Abs: 3046 {cells}/uL (ref 1500–7800)
Neutrophils Relative %: 47.6 %
Platelets: 225 Thousand/uL (ref 140–400)
RBC: 5.11 Million/uL (ref 4.20–5.80)
RDW: 12.8 % (ref 11.0–15.0)
Total Lymphocyte: 39.6 %
WBC: 6.4 Thousand/uL (ref 3.8–10.8)

## 2023-12-18 LAB — COMPREHENSIVE METABOLIC PANEL WITH GFR
AG Ratio: 2.2 (calc) (ref 1.0–2.5)
ALT: 22 U/L (ref 9–46)
AST: 21 U/L (ref 10–40)
Albumin: 4.9 g/dL (ref 3.6–5.1)
Alkaline phosphatase (APISO): 68 U/L (ref 36–130)
BUN: 17 mg/dL (ref 7–25)
CO2: 27 mmol/L (ref 20–32)
Calcium: 9.6 mg/dL (ref 8.6–10.3)
Chloride: 107 mmol/L (ref 98–110)
Creat: 1.24 mg/dL (ref 0.60–1.29)
Globulin: 2.2 g/dL (ref 1.9–3.7)
Glucose, Bld: 88 mg/dL (ref 65–99)
Potassium: 5.2 mmol/L (ref 3.5–5.3)
Sodium: 139 mmol/L (ref 135–146)
Total Bilirubin: 0.6 mg/dL (ref 0.2–1.2)
Total Protein: 7.1 g/dL (ref 6.1–8.1)
eGFR: 74 mL/min/1.73m2 (ref >=60)

## 2023-12-18 NOTE — Progress Notes (Signed)
 CBC and CMP WNL

## 2024-01-05 ENCOUNTER — Other Ambulatory Visit: Payer: Self-pay | Admitting: Physician Assistant

## 2024-01-05 ENCOUNTER — Telehealth: Payer: Self-pay

## 2024-01-05 NOTE — Telephone Encounter (Signed)
 Last Fill: 09/16/2023  Next Visit: 03/17/2024  Last Visit: 12/17/2023  Dx: Fibromyalgia   Current Dose per office note on 12/17/2023: gabapentin  300 mg twice daily   Okay to refill Gabapentin ?

## 2024-01-05 NOTE — Telephone Encounter (Signed)
 Received faxed PA renewal form for TALTZ  from [employer]. Completed and faxed back with supporting documentation will await response.   Fax#: [phone] Phone#: [phone]

## 2024-01-07 NOTE — Telephone Encounter (Signed)
 Received a fax regarding Prior Authorization from [facility] for TALTZ . Authorization has been DENIED because documentation could not be provided to show that pt has experienced clinical benefit by at least one objective measurement OR that pt has experienced improvement in at least one symptom. Denial letter sent to scan center for reference.  PA dept Phone# [phone] Appeal fax#: [phone]  Will route to provider and pharmacist for advisement on how to proceed.

## 2024-01-10 NOTE — Telephone Encounter (Signed)
 Will work on [employer]. Can consider enrolling him into bridge program as well (requires form to be completed by patient)  Sherry Pennant, PharmD, MPH, BCPS, CPP Clinical Pharmacist [facility])

## 2024-01-11 NOTE — Telephone Encounter (Signed)
 The patient's symptoms of inflammatory arthritis have been well controlled on taltz .  No active psoriasis.  Most of his discomfort is due to previous injuries, OA, and fibromyalgia.  I would highly recommend that the patient remain on taltz .

## 2024-01-12 ENCOUNTER — Other Ambulatory Visit: Payer: Self-pay | Admitting: *Deleted

## 2024-01-12 ENCOUNTER — Ambulatory Visit (HOSPITAL_COMMUNITY)
Admission: RE | Admit: 2024-01-12 | Discharge: 2024-01-12 | Disposition: A | Discharge status: Home / Self Care | Source: Ambulatory Visit | Attending: Physician Assistant | Admitting: Physician Assistant

## 2024-01-12 ENCOUNTER — Ambulatory Visit: Admitting: Physician Assistant

## 2024-01-12 DIAGNOSIS — M79605 Pain in left leg: Secondary | ICD-10-CM | POA: Insufficient documentation

## 2024-01-12 DIAGNOSIS — M25551 Pain in right hip: Secondary | ICD-10-CM

## 2024-01-12 DIAGNOSIS — Z79899 Other long term (current) drug therapy: Secondary | ICD-10-CM

## 2024-01-12 DIAGNOSIS — L4050 Arthropathic psoriasis, unspecified: Secondary | ICD-10-CM

## 2024-01-12 DIAGNOSIS — L409 Psoriasis, unspecified: Secondary | ICD-10-CM

## 2024-01-12 MED ORDER — TALTZ 80 MG/ML ~~LOC~~ SOAJ: 80.0000 mg | SUBCUTANEOUS | 0 refills | Status: DC

## 2024-01-12 NOTE — Telephone Encounter (Signed)
 Refill request received via fax from [facility] for Taltz .  Last Fill: 11/09/2023  Labs: 12/17/2023 CBC and CMP WNL   TB Gold: 03/16/2023 Neg   Next Visit: 03/17/2024  Last Visit: 12/17/2023  DX: Psoriatic arthritis   Current Dose per office note 12/17/2023: Taltz  80 mg sq injections every 28 days.   Okay to refill Taltz ?

## 2024-01-12 NOTE — Progress Notes (Signed)
 Office Visit Note   Patient: Miguel Evans           Date of Birth: November 26, 1980           MRN: [id number] Visit Date: 01/12/2024              Requested by: Sabas Norleen PARAS., MD [address] [place],  [place] [address] PCP: Sabas Norleen PARAS., MD   Assessment & Plan: Visit Diagnoses:  1. Left leg pain   2. Pain in right hip     Plan: Impression is probable right hip labral tear and left knee osteoarthritis with associated calf pain today.  In regards to the hip, he is failed intra-articular cortisone injection.  I would like to now obtain an MR arthrogram of the right hip.  He will follow-up once this has been completed.  In regards to the left calf pain, we will go ahead and order an ultrasound just to rule out DVT versus ruptured Baker's cyst.  He will call us  with any concerns or questions.  Follow-Up Instructions: Return if symptoms worsen or fail to improve.   Orders:  Orders Placed This Encounter  Procedures   MR Hip Right w/ contrast   Arthrogram   VAS US  LOWER EXTREMITY VENOUS (DVT)   No orders of the defined types were placed in this encounter.     Procedures: No procedures performed   Clinical Data: No additional findings.   Subjective: Chief Complaint  Patient presents with   Right Hip - Pain    HPI patient is a very pleasant 43 year old gentleman who comes in today with continued right hip pain.  This has been ongoing for years without having had an injury.  Symptoms have progressively worsened.  He experiences pain primarily to the groin.  Symptoms are worse with walking and climbing as well as going from a seated to standing positions.  He has associated clicking, locking and catching.  Right hip was injected with cortisone under ultrasound by Dr. Burnetta after he saw me back in June of this year.  He notes minimal relief during the anesthetic phase.  Of note, he also has a history of left knee osteoarthritis with symptomatic Baker's cyst which has been  aspirated multiple times.  He has had cortisone injections which have not provided much relief.  He continues to have pain and swelling in the popliteal fossa and down into the calf.  No previous history of ultrasound the left lower extremity.  He has a history of rheumatoid arthritis and psoriatic arthritis.  He has been on testosterone  but recently had to stop due to increased red blood cell count.  No personal history of DVT.  Not on any anticoagulants.  Review of Systems as detailed in HPI.  All others reviewed and are negative.   Objective: Vital Signs: There were no vitals taken for this visit.  Physical Exam well-developed well-nourished gentleman in no acute distress.  Alert and oriented x 3.  Ortho Exam right hip exam: Marked pain with logroll and FADIR testing.  Left lower extremity exam: He has moderate tenderness in the popliteal fossa as well as into the calf.  Pain with Homans testing.  Calf is tight.  No skin changes or warmth.  Specialty Comments:  No specialty comments available.  Imaging: No new imaging   PMFS History: Patient Active Problem List   Diagnosis Date Noted   Cervical disc disorder with radiculopathy 02/22/2019   Myofascial pain syndrome 02/22/2019   Tear  of right rotator cuff 09/23/2018   Psoriasis 03/27/2017   Polyarthritis 02/24/2017   Patellofemoral arthralgia of both knees 02/24/2017   Hyperuricemia 03/26/2016   Elevated blood pressure  03/26/2016   Elevated LFTs 03/26/2016   Anxiety 03/26/2016   Psoriatic arthritis (HCC) 03/25/2016   High risk medication use 03/25/2016   Cervicalgia 03/25/2016   Hand pain 03/25/2016   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   Past Medical History:  Diagnosis Date   Anxiety    Arthritis    Gout    Hypertension    Psoriasis    Psoriatic arthritis (HCC)    Sleep apnea    Small fiber neuropathy 08/2023    Family History  Problem Relation Age of Onset   Cancer Mother        breast    Bipolar disorder  Mother    Hypertension Father    Osteoarthritis Father    Emphysema Maternal Grandfather    CAD Paternal Grandfather    Healthy Son    Healthy Son    Healthy Daughter    Healthy Daughter     Past Surgical History:  Procedure Laterality Date   ANTERIOR CRUCIATE LIGAMENT REPAIR Bilateral Left in 2002, Right 2013   ANTERIOR CRUCIATE LIGAMENT REPAIR Bilateral 2014   ROTATOR CUFF REPAIR Left 06/05/2020   SHOULDER ARTHROSCOPY WITH SUBACROMIAL DECOMPRESSION AND OPEN ROTATOR C Right 09/23/2018   Procedure: SHOULDER ARTHROSCOPY WITH SUBACROMIAL DECOMPRESSION AND OPEN ROTATOR CUFF REPAIR,;  Surgeon: Anderson Maude ORN, MD;  Location: [facility];  Service: Orthopedics;  Laterality: Right;  WITH BLOCK   VASECTOMY     Social History   Occupational History   Not on file  Tobacco Use   Smoking status: Former    Current packs/day: 0.00    Average packs/day: 0.3 packs/day for 5.0 years (1.3 ttl pk-yrs)    Types: Cigarettes    Start date: 03/28/2007    Quit date: 03/27/2012    Years since quitting: 11.8    Passive exposure: Past   Smokeless tobacco: Former  Building services engineer status: Never Used  Substance and Sexual Activity   Alcohol use: Yes    Comment: casual   Drug use: No   Sexual activity: Yes    Birth control/protection: Condom

## 2024-01-12 NOTE — Telephone Encounter (Signed)
 Submitted an URGENT appeal to [employer] for TALTZ .  Reference # [id number] Phone: [phone] Fax: [phone]  Sherry Pennant, PharmD, MPH, BCPS, CPP Clinical Pharmacist [facility])

## 2024-01-13 NOTE — Telephone Encounter (Signed)
 Received fax from [employer]. Denial for Taltz  has been OVERTURNED. Taltz  is APPROVED from 01/12/2024 through 01/10/2025  EOC # [id number] Phone: [phone]  Sherry Pennant, PharmD, MPH, BCPS, CPP Clinical Pharmacist [facility])

## 2024-01-15 ENCOUNTER — Other Ambulatory Visit: Payer: Self-pay | Admitting: Physician Assistant

## 2024-01-15 DIAGNOSIS — M1A09X0 Idiopathic chronic gout, multiple sites, without tophus (tophi): Secondary | ICD-10-CM

## 2024-01-15 NOTE — Telephone Encounter (Signed)
 Last Fill: 11/04/2023  Labs: 12/17/2023 CBC and CMP WNL  09/16/2023 uric acid 6.3  Next Visit: 03/17/2024  Last Visit: 12/17/2023  DX: Idiopathic chronic gout of multiple sites without tophus   Current Dose per office note on 12/17/2023: allopurinol  300 mg 1 tablet by mouth daily   Okay to refill Allopurinol ?

## 2024-01-18 ENCOUNTER — Encounter: Payer: Self-pay | Admitting: Physician Assistant

## 2024-01-25 ENCOUNTER — Ambulatory Visit
Admission: RE | Admit: 2024-01-25 | Discharge: 2024-01-25 | Disposition: A | Discharge status: Home / Self Care | Source: Ambulatory Visit | Attending: Physician Assistant | Admitting: Physician Assistant

## 2024-01-25 DIAGNOSIS — M25551 Pain in right hip: Secondary | ICD-10-CM

## 2024-01-25 MED ORDER — IOPAMIDOL (ISOVUE-M 200) INJECTION 41%
10.0000 mL | Freq: Once | INTRAMUSCULAR | Status: AC
  Administered 2024-01-25: 10 mL via INTRA_ARTICULAR

## 2024-01-26 ENCOUNTER — Ambulatory Visit: Payer: Self-pay | Admitting: Physician Assistant

## 2024-01-26 NOTE — Progress Notes (Signed)
 F/u with xu to discuss mri and ? Total hip replacement based on lack of relief from cortisone injection

## 2024-01-26 NOTE — Progress Notes (Signed)
 Called and scheduled

## 2024-02-03 ENCOUNTER — Ambulatory Visit: Admitting: Orthopaedic Surgery

## 2024-02-03 ENCOUNTER — Telehealth: Payer: Self-pay

## 2024-02-03 DIAGNOSIS — Z79899 Other long term (current) drug therapy: Secondary | ICD-10-CM | POA: Diagnosis not present

## 2024-02-03 DIAGNOSIS — M25551 Pain in right hip: Secondary | ICD-10-CM | POA: Diagnosis not present

## 2024-02-03 DIAGNOSIS — M1611 Unilateral primary osteoarthritis, right hip: Secondary | ICD-10-CM | POA: Insufficient documentation

## 2024-02-03 DIAGNOSIS — Z6837 Body mass index (BMI) 37.0-37.9, adult: Secondary | ICD-10-CM | POA: Diagnosis not present

## 2024-02-03 DIAGNOSIS — G8929 Other chronic pain: Secondary | ICD-10-CM | POA: Diagnosis not present

## 2024-02-03 DIAGNOSIS — Z01818 Encounter for other preprocedural examination: Secondary | ICD-10-CM | POA: Diagnosis not present

## 2024-02-03 NOTE — Telephone Encounter (Signed)
 Patient given PCP surgical clearance form. Aware that we must receive clearance form back before being able to proceed with scheduling surgery.

## 2024-02-03 NOTE — Progress Notes (Signed)
 Office Visit Note   Patient: Miguel Evans           Date of Birth: 12/02/1980           MRN: [id number] Visit Date: 02/03/2024              Requested by: Sabas Norleen PARAS., MD [address] [place],  [place] [address] PCP: Sabas Norleen PARAS., MD   Assessment & Plan: Visit Diagnoses:  1. Primary osteoarthritis of right hip     Plan: History of Present Illness Miguel Evans is a 43 year old male with psoriatic arthritis on Taltz  injections monthly who presents with right hip pain and consideration for hip replacement.  Patient initially evaluated by Morna.    He experiences significant hip pain primarily in the groin area, with radiation from the inside to the outside of the hip and a sensation of the hip joint being pulled apart. The pain persists despite a cortisone injection on September 25, 2023, which provided only temporary relief. He is on Taltz  for psoriatic arthritis and engages in regular gym activity, including biking and leg workouts, but experiences significant discomfort post-activity, necessitating the use of ice packs at night. The pain affects his daily life, causing prolonged downtime after physical exertion. An MRI with contrast was performed following the cortisone injection.  Physical Exam MUSCULOSKELETAL: Pain on internal rotation of the hip and logroll.  Pain with stinchfield sign.  Results RADIOLOGY Hip MRI with contrast: Advanced osteoarthritis and degenerative labral tear.    Assessment and Plan Advanced right hip osteoarthritis with degenerative labral tear Advanced osteoarthritis with degenerative labral tear confirmed by MRI, causing significant pain and functional impairment. Previous cortisone injection provided minimal relief. Considering traditional hip replacement due to severity of symptoms and preference over hip resurfacing after detailed discussion on pros and cons. - Obtain surgical clearance from primary care physician. - Schedule hip replacement surgery in  4-6 weeks. - Discontinue Taltz  two weeks prior to surgery and resume two weeks post-surgery. - detailed surgical discussion had  Impression is severe right hip degenerative joint disease secondary to Osteoarthritis.  Patient has attempted conservative treatment for at least 6 consecutive weeks within the past 12 weeks, including but not limited to physical therapy, home exercise program, NSAIDs, activity modification, and/or corticosteroid injections. Despite these efforts, symptoms have not improved or have worsened. Conservative measures have been deemed unsuccessful at this time. After a detailed discussion covering diagnosis and treatment options--including the risks, benefits, alternatives, and potential complications of surgical and nonsurgical management--the patient elected to proceed with surgery.  Current anticoagulants: No antithrombotic Postop anticoagulation: Aspirin 81 mg Diabetic: No  Prior DVT/PE: No Tobacco use: No Clearances needed for surgery: PCP Anticipate discharge dispo: 23 hr obs vs. outpatient   Follow-Up Instructions: No follow-ups on file.   Orders:  No orders of the defined types were placed in this encounter.  No orders of the defined types were placed in this encounter.     Procedures: No procedures performed   Clinical Data: No additional findings.   Subjective: Chief Complaint  Patient presents with   Right Hip - Follow-up    MRI review    HPI  Review of Systems  Constitutional: Negative.   HENT: Negative.    Eyes: Negative.   Respiratory: Negative.    Cardiovascular: Negative.   Gastrointestinal: Negative.   Endocrine: Negative.   Genitourinary: Negative.   Skin: Negative.   Allergic/Immunologic: Negative.   Neurological: Negative.   Hematological: Negative.  Psychiatric/Behavioral: Negative.    All other systems reviewed and are negative.    Objective: Vital Signs: There were no vitals taken for this visit.  Physical  Exam Vitals and nursing note reviewed.  Constitutional:      Appearance: He is well-developed.  HENT:     Head: Normocephalic and atraumatic.  Eyes:     Pupils: Pupils are equal, round, and reactive to light.  Pulmonary:     Effort: Pulmonary effort is normal.  Abdominal:     Palpations: Abdomen is soft.  Musculoskeletal:        General: Normal range of motion.     Cervical back: Neck supple.  Skin:    General: Skin is warm.  Neurological:     Mental Status: He is alert and oriented to person, place, and time.  Psychiatric:        Behavior: Behavior normal.        Thought Content: Thought content normal.        Judgment: Judgment normal.     Ortho Exam  Specialty Comments:  No specialty comments available.  Imaging: No results found.   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   Past Medical History:  Diagnosis Date   Anxiety    Arthritis    Gout    Hypertension    Psoriasis    Psoriatic arthritis (HCC)    Sleep apnea    Small fiber neuropathy 08/2023    Family History  Problem Relation Age of Onset   Cancer Mother        breast    Bipolar disorder Mother    Hypertension Father    Osteoarthritis Father    Emphysema Maternal Grandfather    CAD Paternal Grandfather    Healthy Son    Healthy Son    Healthy Daughter    Healthy Daughter     Past Surgical History:   Procedure Laterality Date   ANTERIOR CRUCIATE LIGAMENT REPAIR Bilateral Left in 2002, Right 2013   ANTERIOR CRUCIATE LIGAMENT REPAIR Bilateral 2014   ROTATOR CUFF REPAIR Left 06/05/2020   SHOULDER ARTHROSCOPY WITH SUBACROMIAL DECOMPRESSION AND OPEN ROTATOR C Right 09/23/2018   Procedure: SHOULDER ARTHROSCOPY WITH SUBACROMIAL DECOMPRESSION AND OPEN ROTATOR CUFF REPAIR,;  Surgeon: Anderson Maude ORN, MD;  Location: [facility];  Service: Orthopedics;  Laterality: Right;  WITH BLOCK   VASECTOMY     Social History   Occupational History   Not on file  Tobacco Use   Smoking status: Former    Current packs/day: 0.00    Average packs/day: 0.3 packs/day for 5.0 years (1.3 ttl pk-yrs)    Types: Cigarettes    Start date: 03/28/2007    Quit date: 03/27/2012    Years since quitting: 11.8    Passive exposure: Past   Smokeless tobacco: Former  Building services engineer status: Never Used  Substance and Sexual Activity   Alcohol  use: Yes    Comment: casual   Drug use: No   Sexual activity: Yes    Birth control/protection: Condom

## 2024-02-08 ENCOUNTER — Encounter: Payer: Self-pay | Admitting: Orthopaedic Surgery

## 2024-02-15 ENCOUNTER — Encounter: Payer: Self-pay | Admitting: Radiology

## 2024-03-03 NOTE — Progress Notes (Unsigned)
 Office Visit Note  Patient: Miguel Evans             Date of Birth: 23-Nov-1980           MRN: [id number]             PCP: Miguel Norleen PARAS., MD Referring: Miguel Norleen PARAS., MD Visit Date: 03/17/2024 Occupation: Data Unavailable  Subjective:  Right hip pain   History of Present Illness: Miguel Evans is a 43 y.o. male with history of psoriatic arthritis. Patient is prescribed taltz  80 mg sq injections every 28 days.  Patient reports that his last dose of Taltz  was administered in early November 2025.  Patient states that he has been holding his dose of Galen this month since he will be undergoing a right total hip arthroplasty on 03/28/2024 by Dr. Jerri.  Patient states over the past 3 weeks he has noticed increased arthralgias and joint stiffness.  His symptoms remain most severe involving the cervical spine, right hip, and left knee.  He continues to have interrupted sleep at night due to nocturnal pain.  He has been taking gabapentin  300 mg twice daily which he finds to be helpful.  He denies any signs or symptoms of a gout flare.  He denies any recent or recurrent infections. He denies any psoriasis at this time.  Activities of Daily Living:  Patient reports morning stiffness for 1-1.5 hours.   Patient Reports nocturnal pain.  Difficulty dressing/grooming: Reports Difficulty climbing stairs: Reports Difficulty getting out of chair: Reports Difficulty using hands for taps, buttons, cutlery, and/or writing: Reports  Review of Systems  Constitutional:  Positive for fatigue.  HENT:  Positive for mouth dryness. Negative for mouth sores.   Eyes:  Positive for photophobia and dryness.  Respiratory:  Negative for shortness of breath.   Cardiovascular:  Negative for chest pain and palpitations.  Gastrointestinal:  Positive for blood in stool, constipation and diarrhea.  Endocrine: Positive for increased urination.  Genitourinary:  Negative for involuntary urination.  Musculoskeletal:  Positive  for joint pain, gait problem, joint pain, myalgias, muscle weakness, morning stiffness, muscle tenderness and myalgias. Negative for joint swelling.  Skin:  Positive for color change. Negative for rash, hair loss and sensitivity to sunlight.  Allergic/Immunologic: Negative for susceptible to infections.  Neurological:  Positive for numbness and headaches. Negative for dizziness.  Hematological:  Negative for swollen glands.  Psychiatric/Behavioral:  Positive for depressed mood and sleep disturbance. The patient is nervous/anxious.     PMFS History:  Patient Active Problem List   Diagnosis Date Noted   Primary osteoarthritis of right hip 02/03/2024   Cervical disc disorder with radiculopathy 02/22/2019   Myofascial pain syndrome 02/22/2019   Tear of right rotator cuff 09/23/2018   Psoriasis 03/27/2017   Polyarthritis 02/24/2017   Patellofemoral arthralgia of both knees 02/24/2017   Hyperuricemia 03/26/2016   Elevated blood pressure  03/26/2016   Elevated LFTs 03/26/2016   Anxiety 03/26/2016   Psoriatic arthritis (HCC) 03/25/2016   High risk medication use 03/25/2016   Cervicalgia 03/25/2016   Hand pain 03/25/2016   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    Past Medical History:  Diagnosis Date   Anxiety    Arthritis    Gout    Hypertension    Psoriasis    Psoriatic arthritis (HCC)    Sleep apnea    Small fiber neuropathy 08/2023    Family History  Problem Relation Age of Onset   Cancer Mother  breast    Bipolar disorder Mother    Hypertension Father    Osteoarthritis Father    Emphysema Maternal Grandfather    CAD Paternal Grandfather    Healthy Son    Healthy Son    Healthy Daughter    Healthy Daughter    Past Surgical History:  Procedure Laterality Date   ANTERIOR CRUCIATE LIGAMENT REPAIR Bilateral Left in 2002, Right 2013   ANTERIOR CRUCIATE LIGAMENT REPAIR Bilateral 2014   ROTATOR CUFF REPAIR Left 06/05/2020   SHOULDER ARTHROSCOPY WITH SUBACROMIAL  DECOMPRESSION AND OPEN ROTATOR C Right 09/23/2018   Procedure: SHOULDER ARTHROSCOPY WITH SUBACROMIAL DECOMPRESSION AND OPEN ROTATOR CUFF REPAIR,;  Surgeon: Anderson Maude ORN, MD;  Location: [facility];  Service: Orthopedics;  Laterality: Right;  WITH BLOCK   VASECTOMY     Social History   Tobacco Use   Smoking status: Former    Current packs/day: 0.00    Average packs/day: 0.3 packs/day for 5.0 years (1.3 ttl pk-yrs)    Types: Cigarettes    Start date: 03/28/2007    Quit date: 03/27/2012    Years since quitting: 11.9    Passive exposure: Past   Smokeless tobacco: Former  Building Services Engineer status: Never Used  Substance Use Topics   Alcohol use: Yes    Comment: casual   Drug use: No   Social History   Social History Narrative   Right handed   4 children   One story home         Are you right handed or left handed? Right Handed   Are you currently employed ? No   What is your current occupation?   Do you live at home alone? No    Who lives with you? Spouse and two children    What type of home do you live in: 1 story or 2 story? Lives in a one story home          There is no immunization history on file for this patient.   Objective: Vital Signs: BP 132/89   Pulse 68   Temp 97.8 F (36.6 C)   Resp 17   Ht 6' 3 (1.905 m)   Wt 272 lb 12.8 oz (123.7 kg)   BMI 34.10 kg/m    Physical Exam Vitals and nursing note reviewed.  Constitutional:      Appearance: He is well-developed.  HENT:     Head: Normocephalic and atraumatic.  Eyes:     Conjunctiva/sclera: Conjunctivae normal.     Pupils: Pupils are equal, round, and reactive to light.  Cardiovascular:     Rate and Rhythm: Normal rate and regular rhythm.     Heart sounds: Normal heart sounds.  Pulmonary:     Effort: Pulmonary effort is normal.     Breath sounds: Normal breath sounds.  Abdominal:     General: Bowel sounds are normal.     Palpations: Abdomen is soft.  Musculoskeletal:     Cervical back:  Normal range of motion and neck supple.  Skin:    General: Skin is warm and dry.     Capillary Refill: Capillary refill takes less than 2 seconds.  Neurological:     Mental Status: He is alert and oriented to person, place, and time.  Psychiatric:        Behavior: Behavior normal.      Musculoskeletal Exam: Generalized hyperalgesia.  C-spine has painful and limited range of motion with lateral rotation.  Discomfort and stiffness  with range of motion of both shoulders.  Left shoulder crepitus noted.  Elbow joints have good range of motion.  Left wrist has limited flexion and extension.  Tenderness of the left 2nd and 3rd MCP and PIP joints.  Painful and limited range of motion of the right hip.  Tenderness over both trochanteric bursa.  Crepitus with range of motion of both knees.  Baker's cyst palpable behind the left knee.  Ankle joints have good range of motion with no tenderness or synovitis.  No evidence of Achilles tendinitis or plantar fasciitis currently.  CDAI Exam: CDAI Score: -- Patient Global: --; Provider Global: -- Swollen: --; Tender: -- Joint Exam 03/17/2024   No joint exam has been documented for this visit   There is currently no information documented on the homunculus. Go to the Rheumatology activity and complete the homunculus joint exam.  Investigation: No additional findings.  Imaging: No results found.  Recent Labs: Lab Results  Component Value Date   WBC 6.4 12/17/2023   HGB 16.4 12/17/2023   PLT 225 12/17/2023   NA 139 12/17/2023   K 5.2 12/17/2023   CL 107 12/17/2023   CO2 27 12/17/2023   GLUCOSE 88 12/17/2023   BUN 17 12/17/2023   CREATININE 1.24 12/17/2023   BILITOT 0.6 12/17/2023   ALKPHOS 74 10/24/2016   AST 21 12/17/2023   ALT 22 12/17/2023   PROT 7.1 12/17/2023   ALBUMIN 4.7 10/24/2016   CALCIUM 9.6 12/17/2023   GFRAA 103 08/31/2020   QFTBGOLDPLUS NEGATIVE 03/16/2023    Speciality Comments: Humira, Enbrel12/17-06/18, Cosyntex  06/18-09/20,Taltz  10/20 MTX -high LFTs  Procedures:  No procedures performed Allergies: Patient has no known allergies.    Assessment / Plan:     Visit Diagnoses: Psoriatic arthritis [facility] Patient presents today experiencing increased arthralgias and joint stiffness which he attributes to being overdue for a Taltz  dose.  Patient is scheduled for a right total hip arthroplasty on 03/28/2024 by Dr. Jerri.  His last dose of taltz  was administered in early November 2025.  He has no active psoriasis at this time.  He has noticed intermittent swelling in his hands and feet with overuse or repetitive activities. Patient is aware that he will require clearance by Dr. Jerri prior to resuming Taltz  during the postoperative period.  He will notify us  if he develops any new or worsening symptoms. He will follow up in 3 months or sooner if needed.    Psoriasis: No active psoriasis at this time.  High risk medication use - Taltz  80 mg sq injections every 28 days. Hx of vasectomy. Discontinued arava -no clinical benefit.  CBC and CMP updated on 12/17/23.  Orders for CBC and CMP were released today. TB gold negative 03/16/23.  Order for TB gold released today. No recent or recurrent infections.  Discussed the importance of holding taltz  if he develops signs or symptoms of an infection and to resume once the infection has completely cleared.  - Plan: CBC with Differential/Platelet, Comprehensive metabolic panel with GFR, QuantiFERON-TB Gold Plus  Screening for tuberculosis - Order for TB gold released today. Plan: QuantiFERON-TB Gold Plus  Idiopathic chronic gout of multiple sites without tophus - He has not had any signs or symptoms of a gout flare.  He is clinically doing well taking allopurinol  300 mg 1 tablet by mouth daily and colchicine  0.6 mg as needed during flares.  Plan to check uric acid level today.- Plan: Uric acid  Pain in right hip - Patient had a  right trochanteric bursa cortisone injection performed  on 06/15/2023.   He has continued to have chronic pain affecting the right hip.  He has painful range of motion on examination today. MRI 01/25/2024: Moderate to severe degenerative arthrosis of the right hip with Cholera osteophyte formation, joint space loss and probable degenerative tear of the anterior superior labrum.  Patient has established care with Dr. Jerri and is scheduled for a right total hip arthroplasty on 03/28/2024.  S/P right rotator cuff repair: Overall doing well.  Chronic left shoulder pain: Crepitus with range of motion noted on examination today.  Discomfort and stiffness noted with range of motion.  Primary osteoarthritis of both knees: Patient has crepitus with range of motion of both knees.  He continues to have recurrent Baker's cyst behind the left knee.  Baker's cyst of knee, left - Aspiration performed on 06/17/2023.  Recurrent.  Chronic left SI joint pain: Intermittent discomfort.  Trochanteric bursitis, right hip: Intermittent discomfort.  DDD (degenerative disc disease), cervical: Patient continues to have chronic pain and stiffness affecting the cervical spine.  He has limited range of motion with lateral rotation.  He continues to have nocturnal pain.  He remains on gabapentin  300 mg twice daily.  Fibromyalgia -He continues to have intermittent myalgias and muscle tenderness.  He remains on gabapentin  300 mg twice daily and Cymbalta 60 mg daily as prescribed.  Other medical conditions are listed as follows:  History of hypertension: Blood pressure was 132/89 today in the office.  History of sleep apnea  Small fiber neuropathy: He is taking gabapentin  as prescribed.    Orders: Orders Placed This Encounter  Procedures   CBC with Differential/Platelet   Comprehensive metabolic panel with GFR   QuantiFERON-TB Gold Plus   Uric acid   No orders of the defined types were placed in this encounter.    Follow-Up Instructions: Return in about 3 months  (around 06/15/2024) for Psoriatic arthritis.   Waddell CHRISTELLA Craze, PA-C  Note - This record has been created using Dragon software.  Chart creation errors have been sought, but may not always  have been located. Such creation errors do not reflect on  the standard of medical care.

## 2024-03-17 ENCOUNTER — Ambulatory Visit: Attending: Physician Assistant | Admitting: Physician Assistant

## 2024-03-17 ENCOUNTER — Encounter: Payer: Self-pay | Admitting: Physician Assistant

## 2024-03-17 VITALS — BP 132/89 | HR 68 | Temp 97.8°F | Resp 17 | Ht 75.0 in | Wt 272.8 lb

## 2024-03-17 DIAGNOSIS — G8929 Other chronic pain: Secondary | ICD-10-CM

## 2024-03-17 DIAGNOSIS — Z111 Encounter for screening for respiratory tuberculosis: Secondary | ICD-10-CM

## 2024-03-17 DIAGNOSIS — Z8669 Personal history of other diseases of the nervous system and sense organs: Secondary | ICD-10-CM

## 2024-03-17 DIAGNOSIS — L4050 Arthropathic psoriasis, unspecified: Secondary | ICD-10-CM | POA: Diagnosis not present

## 2024-03-17 DIAGNOSIS — M170 Bilateral primary osteoarthritis of knee: Secondary | ICD-10-CM

## 2024-03-17 DIAGNOSIS — L409 Psoriasis, unspecified: Secondary | ICD-10-CM

## 2024-03-17 DIAGNOSIS — M1A09X0 Idiopathic chronic gout, multiple sites, without tophus (tophi): Secondary | ICD-10-CM

## 2024-03-17 DIAGNOSIS — M25551 Pain in right hip: Secondary | ICD-10-CM

## 2024-03-17 DIAGNOSIS — Z8679 Personal history of other diseases of the circulatory system: Secondary | ICD-10-CM

## 2024-03-17 DIAGNOSIS — M7061 Trochanteric bursitis, right hip: Secondary | ICD-10-CM

## 2024-03-17 DIAGNOSIS — M25512 Pain in left shoulder: Secondary | ICD-10-CM

## 2024-03-17 DIAGNOSIS — M533 Sacrococcygeal disorders, not elsewhere classified: Secondary | ICD-10-CM

## 2024-03-17 DIAGNOSIS — M797 Fibromyalgia: Secondary | ICD-10-CM

## 2024-03-17 DIAGNOSIS — M7122 Synovial cyst of popliteal space [Baker], left knee: Secondary | ICD-10-CM

## 2024-03-17 DIAGNOSIS — M5030 Other cervical disc degeneration, unspecified cervical region: Secondary | ICD-10-CM

## 2024-03-17 DIAGNOSIS — Z79899 Other long term (current) drug therapy: Secondary | ICD-10-CM | POA: Diagnosis not present

## 2024-03-17 DIAGNOSIS — G629 Polyneuropathy, unspecified: Secondary | ICD-10-CM

## 2024-03-17 DIAGNOSIS — Z9889 Other specified postprocedural states: Secondary | ICD-10-CM

## 2024-03-17 NOTE — Patient Instructions (Signed)
 Standing Labs We placed an order today for your standing lab work.   Please have your standing labs drawn in March and every 3 months   Please have your labs drawn 2 weeks prior to your appointment so that the provider can discuss your lab results at your appointment, if possible.  Please note that you may see your imaging and lab results in MyChart before we have reviewed them. We will contact you once all results are reviewed. Please allow our office up to 72 hours to thoroughly review all of the results before contacting the office for clarification of your results.  WALK-IN LAB HOURS  Monday through Thursday from 8:00 am - 4:30 pm and Friday from 8:00 am-12:00 pm.  Patients with office visits requiring labs will be seen before walk-in labs.  You may encounter longer than normal wait times. Please allow additional time. Wait times may be shorter on  Monday and Thursday afternoons.  We do not book appointments for walk-in labs. We appreciate your patience and understanding with our staff.   Labs are drawn by [facility]. Please bring your co-pay at the time of your lab draw.  You may receive a bill from [facility] for your lab work.  Please note if you are on Hydroxychloroquine and and an order has been placed for a Hydroxychloroquine level,  you will need to have it drawn 4 hours or more after your last dose.  If you wish to have your labs drawn at another location, please call the office 24 hours in advance so we can fax the orders.  The office is located at [address], [place], [place] [address]   If you have any questions regarding directions or hours of operation,  please call [phone].   As a reminder, please drink plenty of water  prior to coming for your lab work. Thanks!

## 2024-03-18 ENCOUNTER — Ambulatory Visit: Payer: Self-pay | Admitting: Rheumatology

## 2024-03-18 NOTE — Progress Notes (Signed)
 CBC, CMP are stable, uric acid is in the desirable range.  TB Gold is pending.

## 2024-03-20 LAB — COMPREHENSIVE METABOLIC PANEL WITH GFR
AG Ratio: 2.3 (calc) (ref 1.0–2.5)
ALT: 21 U/L (ref 9–46)
AST: 22 U/L (ref 10–40)
Albumin: 5.3 g/dL — ABNORMAL HIGH (ref 3.6–5.1)
Alkaline phosphatase (APISO): 78 U/L (ref 36–130)
BUN: 17 mg/dL (ref 7–25)
CO2: 26 mmol/L (ref 20–32)
Calcium: 9.6 mg/dL (ref 8.6–10.3)
Chloride: 102 mmol/L (ref 98–110)
Creat: 0.97 mg/dL (ref 0.60–1.29)
Globulin: 2.3 g/dL (ref 1.9–3.7)
Glucose, Bld: 105 mg/dL — ABNORMAL HIGH (ref 65–99)
Potassium: 4.6 mmol/L (ref 3.5–5.3)
Sodium: 140 mmol/L (ref 135–146)
Total Bilirubin: 1.5 mg/dL — ABNORMAL HIGH (ref 0.2–1.2)
Total Protein: 7.6 g/dL (ref 6.1–8.1)
eGFR: 100 mL/min/1.73m2 (ref >=60)

## 2024-03-20 LAB — CBC WITH DIFFERENTIAL/PLATELET
Absolute Lymphocytes: 2878 {cells}/uL (ref 850–3900)
Absolute Monocytes: 568 {cells}/uL (ref 200–950)
Basophils Absolute: 53 {cells}/uL (ref 0–200)
Basophils Relative: 0.8 %
Eosinophils Absolute: 231 {cells}/uL (ref 15–500)
Eosinophils Relative: 3.5 %
HCT: 48.8 % (ref 39.4–51.1)
Hemoglobin: 16.7 g/dL (ref 13.2–17.1)
MCH: 32.3 pg (ref 27.0–33.0)
MCHC: 34.2 g/dL (ref 31.6–35.4)
MCV: 94.4 fL (ref 81.4–101.7)
MPV: 9.6 fL (ref 7.5–12.5)
Monocytes Relative: 8.6 %
Neutro Abs: 2871 {cells}/uL (ref 1500–7800)
Neutrophils Relative %: 43.5 %
Platelets: 245 Thousand/uL (ref 140–400)
RBC: 5.17 Million/uL (ref 4.20–5.80)
RDW: 12.6 % (ref 11.0–15.0)
Total Lymphocyte: 43.6 %
WBC: 6.6 Thousand/uL (ref 3.8–10.8)

## 2024-03-20 LAB — QUANTIFERON-TB GOLD PLUS
Mitogen-NIL: 6.88 [IU]/mL
NIL: 0.02 [IU]/mL
QuantiFERON-TB Gold Plus: NEGATIVE
TB1-NIL: 0 [IU]/mL
TB2-NIL: 0 [IU]/mL

## 2024-03-20 LAB — URIC ACID: Uric Acid, Serum: 5.9 mg/dL (ref 4.0–8.0)

## 2024-03-21 NOTE — Progress Notes (Signed)
 TB gold negative

## 2024-03-24 NOTE — Progress Notes (Signed)
 Surgical Instructions   Your procedure is scheduled on Monday, December 15th, 2025. Report to [facility] Main Entrance A at 7:30 A.M., then check in with the Admitting office. Any questions or running late day of surgery: call [phone]  Questions prior to your surgery date: call [phone], Monday-Friday, 8am-4pm. If you experience any cold or flu symptoms such as cough, fever, chills, shortness of breath, etc. between now and your scheduled surgery, please notify us  at the above number.     Remember:  Do not eat after midnight the night before your surgery  You may drink clear liquids until 7:00 the morning of your surgery.   Clear liquids allowed are: Water, Non-Citrus Juices (without pulp), Carbonated Beverages, Clear Tea (no milk, honey, etc.), Black Coffee Only (NO MILK, CREAM OR POWDERED CREAMER of any kind), and Gatorade.    Take these medicines the morning of surgery with A SIP OF WATER: Allopurinol  (Zyloprim ) Amlodipine (Norvasc) Duloxetine (Cymbalta) Gabapentin  (Neurontin ) Metoprolol Tartrate (Lopressor) Omeprazole (Prilosec)   May take these medicines IF NEEDED: Alprazolam (Xanax)    One week prior to surgery, STOP taking any Aspirin (unless otherwise instructed by your surgeon) Aleve, Naproxen, Ibuprofen, Motrin, Advil, Goody's, BC's, all herbal medications, fish oil, and non-prescription vitamins.                     Do NOT Smoke (Tobacco/Vaping) for 24 hours prior to your procedure.  If you use a CPAP at night, you may bring your mask/headgear for your overnight stay.   You will be asked to remove any contacts, glasses, piercing's, hearing aid's, dentures/partials prior to surgery. Please bring cases for these items if needed.    Patients discharged the day of surgery will not be allowed to drive home, and someone needs to stay with them for 24 hours.  SURGICAL WAITING ROOM VISITATION Patients may have no more than 2 support people in the waiting area  - these visitors may rotate.   Pre-op nurse will coordinate an appropriate time for 1 ADULT support person, who may not rotate, to accompany patient in pre-op.  Children under the age of 22 must have an adult with them who is not the patient and must remain in the main waiting area with an adult.  If the patient needs to stay at the hospital during part of their recovery, the visitor guidelines for inpatient rooms apply.  Please refer to the [facility] website for the visitor guidelines for any additional information.   If you received a COVID test during your pre-op visit  it is requested that you wear a mask when out in public, stay away from anyone that may not be feeling well and notify your surgeon if you develop symptoms. If you have been in contact with anyone that has tested positive in the last 10 days please notify you surgeon.      Pre-operative 4 CHG Bathing Instructions   You can play a key role in reducing the risk of infection after surgery. Your skin needs to be as free of germs as possible. You can reduce the number of germs on your skin by washing with CHG (chlorhexidine  gluconate) soap before surgery. CHG is an antiseptic soap that kills germs and continues to kill germs even after washing.   DO NOT use if you have an allergy to chlorhexidine /CHG or antibacterial soaps. If your skin becomes reddened or irritated, stop using the CHG and notify one of our RNs at [phone].  Please shower with the CHG soap starting 4 days before surgery using the following schedule:     Please keep in mind the following:  DO NOT shave, including legs and underarms, starting the day of your first shower.   You may shave your face at any point before/day of surgery.  Place clean sheets on your bed the day you start using CHG soap. Use a clean washcloth (not used since being washed) for each shower. DO NOT sleep with pets once you start using the CHG.   CHG Shower Instructions:  Wash  your face and private area with normal soap. If you choose to wash your hair, wash first with your normal shampoo.  After you use shampoo/soap, rinse your hair and body thoroughly to remove shampoo/soap residue.  Turn the water OFF and apply  bottle of CHG soap to a CLEAN washcloth.  Apply CHG soap ONLY FROM YOUR NECK DOWN TO YOUR TOES (washing for 3-5 minutes)  DO NOT use CHG soap on face, private areas, open wounds, or sores.  Pay special attention to the area where your surgery is being performed.  If you are having back surgery, having someone wash your back for you may be helpful. Wait 2 minutes after CHG soap is applied, then you may rinse off the CHG soap.  Pat dry with a clean towel  Put on clean clothes/pajamas   If you choose to wear lotion, please use ONLY the CHG-compatible lotions that are listed below.  Additional instructions for the day of surgery:  If you choose, you may shower the morning of surgery with an antibacterial soap.  DO NOT APPLY any lotions, deodorants, cologne, or perfumes.   Do not bring valuables to the hospital. [facility] is not responsible for any belongings/valuables. Do not wear nail polish, gel polish, artificial nails, or any other type of covering on natural nails (fingers and toes) Do not wear jewelry or makeup Put on clean/comfortable clothes.  Please brush your teeth.  Ask your nurse before applying any prescription medications to the skin.     CHG Compatible Lotions   Aveeno Moisturizing lotion  Cetaphil Moisturizing Cream  Cetaphil Moisturizing Lotion  Clairol Herbal Essence Moisturizing Lotion, Dry Skin  Clairol Herbal Essence Moisturizing Lotion, Extra Dry Skin  Clairol Herbal Essence Moisturizing Lotion, Normal Skin  Curel Age Defying Therapeutic Moisturizing Lotion with Alpha Hydroxy  Curel Extreme Care Body Lotion  Curel Soothing Hands Moisturizing Hand Lotion  Curel Therapeutic Moisturizing Cream, Fragrance-Free  Curel  Therapeutic Moisturizing Lotion, Fragrance-Free  Curel Therapeutic Moisturizing Lotion, Original Formula  Eucerin Daily Replenishing Lotion  Eucerin Dry Skin Therapy Plus Alpha Hydroxy Crme  Eucerin Dry Skin Therapy Plus Alpha Hydroxy Lotion  Eucerin Original Crme  Eucerin Original Lotion  Eucerin Plus Crme Eucerin Plus Lotion  Eucerin TriLipid Replenishing Lotion  Keri Anti-Bacterial Hand Lotion  Keri Deep Conditioning Original Lotion Dry Skin Formula Softly Scented  Keri Deep Conditioning Original Lotion, Fragrance Free Sensitive Skin Formula  Keri Lotion Fast Absorbing Fragrance Free Sensitive Skin Formula  Keri Lotion Fast Absorbing Softly Scented Dry Skin Formula  Keri Original Lotion  Keri Skin Renewal Lotion Keri Silky Smooth Lotion  Keri Silky Smooth Sensitive Skin Lotion  Nivea Body Creamy Conditioning Oil  Nivea Body Extra Enriched Teacher, Adult Education Moisturizing Lotion Nivea Crme  Nivea Skin Firming Lotion  NutraDerm 30 Skin Lotion  NutraDerm Skin Lotion  NutraDerm Therapeutic Skin Cream  NutraDerm Therapeutic Skin  Lotion  ProShield Protective Hand Cream  Provon moisturizing lotion  Please read over the following fact sheets that you were given.

## 2024-03-25 ENCOUNTER — Encounter (HOSPITAL_COMMUNITY): Payer: Self-pay

## 2024-03-25 ENCOUNTER — Other Ambulatory Visit: Payer: Self-pay

## 2024-03-25 ENCOUNTER — Inpatient Hospital Stay (HOSPITAL_COMMUNITY)
Admission: RE | Admit: 2024-03-25 | Discharge: 2024-03-25 | Attending: Orthopaedic Surgery | Admitting: Orthopaedic Surgery

## 2024-03-25 VITALS — BP 134/86 | HR 69 | Temp 98.0°F | Resp 18 | Ht 75.0 in | Wt 276.7 lb

## 2024-03-25 DIAGNOSIS — Z01812 Encounter for preprocedural laboratory examination: Secondary | ICD-10-CM | POA: Diagnosis not present

## 2024-03-25 DIAGNOSIS — Z01818 Encounter for other preprocedural examination: Secondary | ICD-10-CM

## 2024-03-25 DIAGNOSIS — M1611 Unilateral primary osteoarthritis, right hip: Secondary | ICD-10-CM

## 2024-03-25 DIAGNOSIS — Z01810 Encounter for preprocedural cardiovascular examination: Secondary | ICD-10-CM | POA: Diagnosis not present

## 2024-03-25 LAB — CBC
HCT: 44.9 % (ref 39.0–52.0)
Hemoglobin: 15.3 g/dL (ref 13.0–17.0)
MCH: 32.3 pg (ref 26.0–34.0)
MCHC: 34.1 g/dL (ref 30.0–36.0)
MCV: 94.7 fL (ref 80.0–100.0)
Platelets: 200 K/uL (ref 150–400)
RBC: 4.74 MIL/uL (ref 4.22–5.81)
RDW: 12.4 % (ref 11.5–15.5)
WBC: 7.2 K/uL (ref 4.0–10.5)
nRBC: 0 % (ref 0.0–0.2)

## 2024-03-25 LAB — TYPE AND SCREEN
ABO/RH(D): O NEG
Antibody Screen: NEGATIVE

## 2024-03-25 LAB — BASIC METABOLIC PANEL WITH GFR
Anion gap: 8 (ref 5–15)
BUN: 22 mg/dL — ABNORMAL HIGH (ref 6–20)
CO2: 29 mmol/L (ref 22–32)
Calcium: 9.2 mg/dL (ref 8.9–10.3)
Chloride: 103 mmol/L (ref 98–111)
Creatinine, Ser: 1.08 mg/dL (ref 0.61–1.24)
GFR, Estimated: >60 mL/min (ref >60)
Glucose, Bld: 82 mg/dL (ref 70–99)
Potassium: 4.4 mmol/L (ref 3.5–5.1)
Sodium: 140 mmol/L (ref 135–145)

## 2024-03-25 LAB — SURGICAL PCR SCREEN
MRSA, PCR: NEGATIVE
Staphylococcus aureus: NEGATIVE

## 2024-03-25 NOTE — Progress Notes (Signed)
 PCP - Sabas Norleen PARAS., MD  Cardiologist -   PPM/ICD - denies Device Orders - n/a Rep Notified - n/a  Chest x-ray - denies EKG - 03-25-24 Stress Test - denies ECHO - denies Cardiac Cath - denies  Sleep Study - denies CPAP - denies  Dm -denies  Blood Thinner Instructions:denies Aspirin Instructions:denies  ERAS Protcol -clear liquids until 7:00 am. PRE-SURGERY Ensure   COVID TEST- n/a   Anesthesia review: no  Patient denies shortness of breath, fever, cough and chest pain at PAT appointment   All instructions explained to the patient, with a verbal understanding of the material. Patient agrees to go over the instructions while at home for a better understanding. Patient also instructed to self quarantine after being tested for COVID-19. The opportunity to ask questions was provided.

## 2024-03-26 DIAGNOSIS — M1611 Unilateral primary osteoarthritis, right hip: Secondary | ICD-10-CM | POA: Diagnosis not present

## 2024-03-27 NOTE — Progress Notes (Signed)
 Patient aware of new arrival time for surgery 0530, ERAS 0430.

## 2024-03-28 ENCOUNTER — Observation Stay (HOSPITAL_COMMUNITY)

## 2024-03-28 ENCOUNTER — Encounter (HOSPITAL_COMMUNITY): Payer: Self-pay | Admitting: Orthopaedic Surgery

## 2024-03-28 ENCOUNTER — Observation Stay (HOSPITAL_COMMUNITY)
Admission: RE | Admit: 2024-03-28 | Discharge: 2024-03-28 | Disposition: A | Attending: Orthopaedic Surgery | Admitting: Orthopaedic Surgery

## 2024-03-28 ENCOUNTER — Ambulatory Visit (HOSPITAL_COMMUNITY): Admitting: Certified Registered Nurse Anesthetist

## 2024-03-28 ENCOUNTER — Ambulatory Visit (HOSPITAL_COMMUNITY)

## 2024-03-28 ENCOUNTER — Encounter (HOSPITAL_COMMUNITY): Admission: RE | Disposition: A | Payer: Self-pay | Source: Home / Self Care | Attending: Orthopaedic Surgery

## 2024-03-28 ENCOUNTER — Other Ambulatory Visit: Payer: Self-pay

## 2024-03-28 ENCOUNTER — Other Ambulatory Visit (HOSPITAL_COMMUNITY): Payer: Self-pay

## 2024-03-28 DIAGNOSIS — M1611 Unilateral primary osteoarthritis, right hip: Principal | ICD-10-CM | POA: Diagnosis present

## 2024-03-28 DIAGNOSIS — Z471 Aftercare following joint replacement surgery: Secondary | ICD-10-CM | POA: Diagnosis not present

## 2024-03-28 DIAGNOSIS — Z79899 Other long term (current) drug therapy: Secondary | ICD-10-CM | POA: Diagnosis not present

## 2024-03-28 DIAGNOSIS — I10 Essential (primary) hypertension: Secondary | ICD-10-CM | POA: Diagnosis not present

## 2024-03-28 DIAGNOSIS — Z96641 Presence of right artificial hip joint: Secondary | ICD-10-CM

## 2024-03-28 DIAGNOSIS — Z87891 Personal history of nicotine dependence: Secondary | ICD-10-CM | POA: Diagnosis not present

## 2024-03-28 HISTORY — PX: TOTAL HIP ARTHROPLASTY: SHX124

## 2024-03-28 LAB — ABO/RH: ABO/RH(D): O NEG

## 2024-03-28 SURGERY — ARTHROPLASTY, HIP, TOTAL, ANTERIOR APPROACH
Anesthesia: Monitor Anesthesia Care | Site: Hip | Laterality: Right

## 2024-03-28 MED ORDER — TRANEXAMIC ACID-NACL 1000-0.7 MG/100ML-% IV SOLN
1000.0000 mg | INTRAVENOUS | Status: AC
  Administered 2024-03-28: 08:00:00 1000 mg via INTRAVENOUS
  Filled 2024-03-28: qty 100

## 2024-03-28 MED ORDER — PROPOFOL 10 MG/ML IV BOLUS
INTRAVENOUS | Status: DC | PRN
  Administered 2024-03-28: 08:00:00 50 mg via INTRAVENOUS
  Administered 2024-03-28: 08:00:00 100 mg via INTRAVENOUS
  Administered 2024-03-28 (×3): 50 mg via INTRAVENOUS

## 2024-03-28 MED ORDER — HYDROCODONE-ACETAMINOPHEN 5-325 MG PO TABS
1.0000 | ORAL_TABLET | Freq: Three times a day (TID) | ORAL | Status: DC | PRN
  Administered 2024-03-28: 11:00:00 1 via ORAL

## 2024-03-28 MED ORDER — HYDROCODONE-ACETAMINOPHEN 7.5-325 MG PO TABS: 1.0000 | ORAL_TABLET | Freq: Three times a day (TID) | ORAL | Status: DC | PRN

## 2024-03-28 MED ORDER — METOCLOPRAMIDE HCL 5 MG PO TABS
5.0000 mg | ORAL_TABLET | Freq: Three times a day (TID) | ORAL | Status: DC | PRN
  Filled 2024-03-28: qty 2

## 2024-03-28 MED ORDER — LACTATED RINGERS IV BOLUS
250.0000 mL | Freq: Once | INTRAVENOUS | Status: AC
  Administered 2024-03-28: 11:00:00 250 mL via INTRAVENOUS

## 2024-03-28 MED ORDER — 0.9 % SODIUM CHLORIDE (POUR BTL) OPTIME
TOPICAL | Status: DC | PRN
  Administered 2024-03-28: 08:00:00 1000 mL

## 2024-03-28 MED ORDER — CEFAZOLIN SODIUM-DEXTROSE 2-4 GM/100ML-% IV SOLN
2.0000 g | Freq: Four times a day (QID) | INTRAVENOUS | Status: DC
  Administered 2024-03-28: 11:00:00 2 g via INTRAVENOUS

## 2024-03-28 MED ORDER — PHENOL 1.4 % MT LIQD: 1.0000 | OROMUCOSAL | Status: DC | PRN

## 2024-03-28 MED ORDER — DULOXETINE HCL 60 MG PO CPEP: 60.0000 mg | ORAL_CAPSULE | Freq: Every morning | ORAL | Status: DC

## 2024-03-28 MED ORDER — PROPOFOL 10 MG/ML IV BOLUS
INTRAVENOUS | Status: AC
  Filled 2024-03-28: qty 20

## 2024-03-28 MED ORDER — MORPHINE SULFATE (PF) 2 MG/ML IV SOLN: 0.5000 mg | Freq: Four times a day (QID) | INTRAVENOUS | Status: DC | PRN

## 2024-03-28 MED ORDER — MEPIVACAINE HCL (PF) 2 % IJ SOLN
INTRAMUSCULAR | Status: DC | PRN
  Administered 2024-03-28: 08:00:00 70 mg via INTRATHECAL

## 2024-03-28 MED ORDER — VANCOMYCIN HCL 1 G IV SOLR
INTRAVENOUS | Status: DC | PRN
  Administered 2024-03-28: 08:00:00 1000 mg via TOPICAL

## 2024-03-28 MED ORDER — ONDANSETRON HCL 4 MG/2ML IJ SOLN
INTRAMUSCULAR | Status: DC | PRN
  Administered 2024-03-28: 08:00:00 4 mg via INTRAVENOUS

## 2024-03-28 MED ORDER — ORAL CARE MOUTH RINSE: 15.0000 mL | Freq: Once | OROMUCOSAL | Status: AC

## 2024-03-28 MED ORDER — DEXAMETHASONE SOD PHOSPHATE PF 10 MG/ML IJ SOLN: 10.0000 mg | Freq: Once | INTRAMUSCULAR | Status: DC

## 2024-03-28 MED ORDER — OXYCODONE HCL 5 MG PO TABS
ORAL_TABLET | ORAL | Status: AC
  Filled 2024-03-28: qty 1

## 2024-03-28 MED ORDER — LACTATED RINGERS IV SOLN: INTRAVENOUS | Status: DC

## 2024-03-28 MED ORDER — ACETAMINOPHEN 500 MG PO TABS: 1000.0000 mg | ORAL_TABLET | Freq: Four times a day (QID) | ORAL | Status: DC

## 2024-03-28 MED ORDER — PROPOFOL 500 MG/50ML IV EMUL
INTRAVENOUS | Status: DC | PRN
  Administered 2024-03-28: 08:00:00 75 ug/kg/min via INTRAVENOUS

## 2024-03-28 MED ORDER — SODIUM CHLORIDE 0.9 % IV SOLN: INTRAVENOUS | Status: DC

## 2024-03-28 MED ORDER — FENTANYL CITRATE (PF) 100 MCG/2ML IJ SOLN
INTRAMUSCULAR | Status: AC
  Filled 2024-03-28: qty 2

## 2024-03-28 MED ORDER — POVIDONE-IODINE 10 % EX SWAB
2.0000 | Freq: Once | CUTANEOUS | Status: AC
  Administered 2024-03-28: 06:00:00 2 via TOPICAL

## 2024-03-28 MED ORDER — MENTHOL 3 MG MT LOZG: 1.0000 | LOZENGE | OROMUCOSAL | Status: DC | PRN

## 2024-03-28 MED ORDER — ONDANSETRON HCL 4 MG PO TABS: 4.0000 mg | ORAL_TABLET | Freq: Four times a day (QID) | ORAL | Status: DC | PRN

## 2024-03-28 MED ORDER — PANTOPRAZOLE SODIUM 40 MG PO TBEC: 40.0000 mg | DELAYED_RELEASE_TABLET | Freq: Every day | ORAL | Status: DC

## 2024-03-28 MED ORDER — METHOCARBAMOL 1000 MG/10ML IJ SOLN: 500.0000 mg | Freq: Four times a day (QID) | INTRAMUSCULAR | Status: DC | PRN

## 2024-03-28 MED ORDER — ACETAMINOPHEN 325 MG PO TABS: 325.0000 mg | ORAL_TABLET | Freq: Four times a day (QID) | ORAL | Status: DC | PRN

## 2024-03-28 MED ORDER — BUPIVACAINE-MELOXICAM ER 400-12 MG/14ML IJ SOLN
INTRAMUSCULAR | Status: AC
  Filled 2024-03-28: qty 1

## 2024-03-28 MED ORDER — TRANEXAMIC ACID 1000 MG/10ML IV SOLN
2000.0000 mg | INTRAVENOUS | Status: DC
  Filled 2024-03-28: qty 20

## 2024-03-28 MED ORDER — OXYCODONE-ACETAMINOPHEN 5-325 MG PO TABS
1.0000 | ORAL_TABLET | Freq: Two times a day (BID) | ORAL | 0 refills | Status: AC | PRN
  Filled 2024-03-28: qty 20, 5d supply, fill #0

## 2024-03-28 MED ORDER — SODIUM CHLORIDE 0.9 % IR SOLN
Status: DC | PRN
  Administered 2024-03-28: 08:00:00 1000 mL

## 2024-03-28 MED ORDER — VANCOMYCIN HCL 1000 MG IV SOLR
INTRAVENOUS | Status: AC
  Filled 2024-03-28: qty 20

## 2024-03-28 MED ORDER — HYDROCODONE-ACETAMINOPHEN 5-325 MG PO TABS
ORAL_TABLET | ORAL | Status: AC
  Filled 2024-03-28: qty 1

## 2024-03-28 MED ORDER — GABAPENTIN 300 MG PO CAPS: 300.0000 mg | ORAL_CAPSULE | Freq: Two times a day (BID) | ORAL | Status: DC

## 2024-03-28 MED ORDER — DIPHENHYDRAMINE HCL 12.5 MG/5ML PO ELIX: 25.0000 mg | ORAL_SOLUTION | ORAL | Status: DC | PRN

## 2024-03-28 MED ORDER — FENTANYL CITRATE (PF) 100 MCG/2ML IJ SOLN
INTRAMUSCULAR | Status: DC | PRN
  Administered 2024-03-28 (×2): 50 ug via INTRAVENOUS

## 2024-03-28 MED ORDER — TRANEXAMIC ACID 1000 MG/10ML IV SOLN
INTRAVENOUS | Status: DC | PRN
  Administered 2024-03-28: 09:00:00 2000 mg via TOPICAL

## 2024-03-28 MED ORDER — AMLODIPINE BESYLATE 5 MG PO TABS: 5.0000 mg | ORAL_TABLET | Freq: Every morning | ORAL | Status: DC

## 2024-03-28 MED ORDER — METHOCARBAMOL 500 MG PO TABS
500.0000 mg | ORAL_TABLET | Freq: Four times a day (QID) | ORAL | 2 refills | Status: DC | PRN
  Filled 2024-03-28: qty 30, 8d supply, fill #0

## 2024-03-28 MED ORDER — CEFAZOLIN SODIUM-DEXTROSE 2-4 GM/100ML-% IV SOLN
2.0000 g | INTRAVENOUS | Status: AC
  Administered 2024-03-28: 08:00:00 3 g via INTRAVENOUS
  Filled 2024-03-28: qty 100

## 2024-03-28 MED ORDER — METHOCARBAMOL 500 MG PO TABS
500.0000 mg | ORAL_TABLET | Freq: Four times a day (QID) | ORAL | Status: DC | PRN
  Administered 2024-03-28: 14:00:00 500 mg via ORAL

## 2024-03-28 MED ORDER — LACTATED RINGERS IV BOLUS
500.0000 mL | Freq: Once | INTRAVENOUS | Status: AC
  Administered 2024-03-28: 10:00:00 500 mL via INTRAVENOUS

## 2024-03-28 MED ORDER — PRONTOSAN WOUND IRRIGATION OPTIME
TOPICAL | Status: DC | PRN
  Administered 2024-03-28: 09:00:00 350 mL

## 2024-03-28 MED ORDER — ALUM & MAG HYDROXIDE-SIMETH 200-200-20 MG/5ML PO SUSP: 30.0000 mL | ORAL | Status: DC | PRN

## 2024-03-28 MED ORDER — OXYCODONE HCL 5 MG PO TABS
5.0000 mg | ORAL_TABLET | Freq: Once | ORAL | Status: AC | PRN
  Administered 2024-03-28: 17:00:00 5 mg via ORAL

## 2024-03-28 MED ORDER — POLYETHYLENE GLYCOL 3350 17 G PO PACK: 17.0000 g | PACK | Freq: Every day | ORAL | Status: DC

## 2024-03-28 MED ORDER — OXYCODONE HCL 5 MG/5ML PO SOLN: 5.0000 mg | Freq: Once | ORAL | Status: AC | PRN

## 2024-03-28 MED ORDER — TRANEXAMIC ACID-NACL 1000-0.7 MG/100ML-% IV SOLN
INTRAVENOUS | Status: AC
  Filled 2024-03-28: qty 100

## 2024-03-28 MED ORDER — MAGNESIUM CITRATE PO SOLN: 1.0000 | Freq: Once | ORAL | Status: DC | PRN

## 2024-03-28 MED ORDER — MIDAZOLAM HCL (PF) 2 MG/2ML IJ SOLN
INTRAMUSCULAR | Status: DC | PRN
  Administered 2024-03-28: 07:00:00 2 mg via INTRAVENOUS

## 2024-03-28 MED ORDER — METHOCARBAMOL 500 MG PO TABS
ORAL_TABLET | ORAL | Status: AC
  Filled 2024-03-28: qty 1

## 2024-03-28 MED ORDER — ASPIRIN 81 MG PO TBEC
81.0000 mg | DELAYED_RELEASE_TABLET | Freq: Two times a day (BID) | ORAL | 0 refills | Status: AC
  Filled 2024-03-28: qty 84, 42d supply, fill #0

## 2024-03-28 MED ORDER — ASPIRIN 81 MG PO CHEW: 81.0000 mg | CHEWABLE_TABLET | Freq: Two times a day (BID) | ORAL | Status: DC

## 2024-03-28 MED ORDER — DEXAMETHASONE SOD PHOSPHATE PF 10 MG/ML IJ SOLN
INTRAMUSCULAR | Status: DC | PRN
  Administered 2024-03-28: 08:00:00 10 mg via INTRAVENOUS

## 2024-03-28 MED ORDER — ONDANSETRON HCL 4 MG PO TABS
4.0000 mg | ORAL_TABLET | Freq: Three times a day (TID) | ORAL | 0 refills | Status: AC | PRN
  Filled 2024-03-28: qty 20, 4d supply, fill #0

## 2024-03-28 MED ORDER — ONDANSETRON HCL 4 MG/2ML IJ SOLN: 4.0000 mg | Freq: Four times a day (QID) | INTRAMUSCULAR | Status: DC | PRN

## 2024-03-28 MED ORDER — MIDAZOLAM HCL 2 MG/2ML IJ SOLN
INTRAMUSCULAR | Status: AC
  Filled 2024-03-28: qty 2

## 2024-03-28 MED ORDER — CHLORHEXIDINE GLUCONATE 0.12 % MT SOLN
15.0000 mL | Freq: Once | OROMUCOSAL | Status: AC
  Administered 2024-03-28: 06:00:00 15 mL via OROMUCOSAL
  Filled 2024-03-28: qty 15

## 2024-03-28 MED ORDER — BUPIVACAINE-MELOXICAM ER 400-12 MG/14ML IJ SOLN
INTRAMUSCULAR | Status: DC | PRN
  Administered 2024-03-28: 08:00:00 400 mg

## 2024-03-28 MED ORDER — ONDANSETRON HCL 4 MG/2ML IJ SOLN
INTRAMUSCULAR | Status: AC
  Filled 2024-03-28: qty 2

## 2024-03-28 MED ORDER — TRANEXAMIC ACID-NACL 1000-0.7 MG/100ML-% IV SOLN
1000.0000 mg | Freq: Once | INTRAVENOUS | Status: AC
  Administered 2024-03-28: 13:00:00 1000 mg via INTRAVENOUS

## 2024-03-28 MED ORDER — CEFAZOLIN SODIUM-DEXTROSE 2-4 GM/100ML-% IV SOLN
INTRAVENOUS | Status: AC
  Filled 2024-03-28: qty 100

## 2024-03-28 MED ORDER — TRANEXAMIC ACID-NACL 1000-0.7 MG/100ML-% IV SOLN
INTRAVENOUS | Status: AC
  Filled 2024-03-28: qty 200

## 2024-03-28 MED ORDER — DOXYCYCLINE HYCLATE 100 MG PO TABS
100.0000 mg | ORAL_TABLET | Freq: Two times a day (BID) | ORAL | 0 refills | Status: AC
  Filled 2024-03-28: qty 20, 10d supply, fill #0

## 2024-03-28 MED ORDER — DOCUSATE SODIUM 100 MG PO CAPS: 100.0000 mg | ORAL_CAPSULE | Freq: Two times a day (BID) | ORAL | Status: DC

## 2024-03-28 MED ORDER — FENTANYL CITRATE (PF) 100 MCG/2ML IJ SOLN
25.0000 ug | INTRAMUSCULAR | Status: DC | PRN
  Administered 2024-03-28 (×3): 50 ug via INTRAVENOUS

## 2024-03-28 MED ORDER — METOCLOPRAMIDE HCL 5 MG/ML IJ SOLN: 5.0000 mg | Freq: Three times a day (TID) | INTRAMUSCULAR | Status: DC | PRN

## 2024-03-28 MED ORDER — SORBITOL 70 % SOLN: 30.0000 mL | Freq: Every day | Status: DC | PRN

## 2024-03-28 SURGICAL SUPPLY — 48 items
BAG COUNTER SPONGE SURGICOUNT (BAG) ×1 IMPLANT
BAG DECANTER FOR FLEXI CONT (MISCELLANEOUS) ×1 IMPLANT
BLADE SAG 18X100X1.27 (BLADE) ×1 IMPLANT
COVER PERINEAL POST (MISCELLANEOUS) ×1 IMPLANT
COVER SURGICAL LIGHT HANDLE (MISCELLANEOUS) ×1 IMPLANT
CUP ACETAB W/GRIPTION 54 (Plate) IMPLANT
DERMABOND ADVANCED .7 DNX12 (GAUZE/BANDAGES/DRESSINGS) IMPLANT
DRAPE C-ARM 42X72 X-RAY (DRAPES) ×1 IMPLANT
DRAPE POUCH INSTRU U-SHP 10X18 (DRAPES) ×1 IMPLANT
DRAPE STERI IOBAN 125X83 (DRAPES) ×1 IMPLANT
DRAPE U-SHAPE 47X51 STRL (DRAPES) ×2 IMPLANT
DRSG AQUACEL AG ADV 3.5X10 (GAUZE/BANDAGES/DRESSINGS) ×1 IMPLANT
DURAPREP 26ML APPLICATOR (WOUND CARE) ×2 IMPLANT
ELECTRODE BLDE 4.0 EZ CLN MEGD (MISCELLANEOUS) ×1 IMPLANT
ELECTRODE REM PT RTRN 9FT ADLT (ELECTROSURGICAL) ×1 IMPLANT
GLOVE BIOGEL PI IND STRL 7.0 (GLOVE) ×2 IMPLANT
GLOVE BIOGEL PI IND STRL 7.5 (GLOVE) ×1 IMPLANT
GLOVE BIOGEL PI MICRO STRL 7 (GLOVE) ×5 IMPLANT
GLOVE INDICATOR 7.0 STRL GRN (GLOVE) ×1 IMPLANT
GLOVE SURG SYN 7.5 PF PI (GLOVE) ×5 IMPLANT
GOWN STRL REUS W/ TWL LRG LVL3 (GOWN DISPOSABLE) IMPLANT
GOWN STRL REUS W/ TWL XL LVL3 (GOWN DISPOSABLE) ×1 IMPLANT
GOWN STRL SURGICAL XL XLNG (GOWN DISPOSABLE) ×1 IMPLANT
GOWN TOGA ZIPPER T7+ PEEL AWAY (MISCELLANEOUS) ×1 IMPLANT
HEAD CERAMIC DELTA 36 PLUS 1.5 (Hips) IMPLANT
HOOD PEEL AWAY T7 (MISCELLANEOUS) ×1 IMPLANT
IV 0.9% NACL 1000 ML (IV SOLUTION) ×1 IMPLANT
KIT BASIN OR (CUSTOM PROCEDURE TRAY) ×1 IMPLANT
LINER NEUTRAL 54X36MM PLUS 4 (Hips) IMPLANT
MARKER SKIN DUAL TIP RULER LAB (MISCELLANEOUS) ×1 IMPLANT
NDL SPNL 18GX3.5 QUINCKE PK (NEEDLE) ×1 IMPLANT
PACK TOTAL JOINT (CUSTOM PROCEDURE TRAY) ×1 IMPLANT
PACK UNIVERSAL I (CUSTOM PROCEDURE TRAY) ×1 IMPLANT
SCREW 6.5MMX25MM (Screw) IMPLANT
SET HNDPC FAN SPRY TIP SCT (DISPOSABLE) ×1 IMPLANT
SOLUTION PRONTOSAN WOUND 350ML (IRRIGATION / IRRIGATOR) ×1 IMPLANT
STEM FEM ACTIS HIGH SZ8 (Stem) IMPLANT
SUT ETHIBOND 2 V 37 (SUTURE) ×1 IMPLANT
SUT ETHILON 2 0 FS 18 (SUTURE) IMPLANT
SUT STRATAFIX PDS+ 0 24IN (SUTURE) IMPLANT
SUT VIC AB 0 CT1 27XBRD ANBCTR (SUTURE) ×1 IMPLANT
SUT VIC AB 1 CTX36XBRD ANBCTR (SUTURE) ×1 IMPLANT
SUT VIC AB 2-0 CT1 TAPERPNT 27 (SUTURE) ×2 IMPLANT
SYR 30ML LL (SYRINGE) ×2 IMPLANT
TOWEL GREEN STERILE (TOWEL DISPOSABLE) ×1 IMPLANT
TRAY FOLEY W/BAG SLVR 16FR ST (SET/KITS/TRAYS/PACK) IMPLANT
TUBE SUCT ARGYLE STRL (TUBING) ×1 IMPLANT
YANKAUER SUCT BULB TIP NO VENT (SUCTIONS) ×1 IMPLANT

## 2024-03-28 NOTE — Progress Notes (Signed)
 Spoke with Nena, CM again regarding DME equipment. Patient has been extremely patient but is very irritated at this point due to the amount of time he has been waiting for equipment.   Nena is reaching out to delivery driver for ETA.

## 2024-03-28 NOTE — Transfer of Care (Signed)
 Immediate Anesthesia Transfer of Care Note  Patient: Miguel Evans  Procedure(s) Performed: ARTHROPLASTY, HIP, TOTAL, ANTERIOR APPROACH (Right: Hip)  Patient Location: PACU  Anesthesia Type:General and Spinal  Level of Consciousness: awake, alert , and oriented  Airway & Oxygen Therapy: Patient Spontanous Breathing  Post-op Assessment: Report given to RN and Post -op Vital signs reviewed and stable  Post vital signs: Reviewed and stable  Last Vitals:  Vitals Value Taken Time  BP 114/66 03/28/24 09:30  Temp    Pulse 75 03/28/24 09:30  Resp 17 03/28/24 09:30  SpO2 98 % 03/28/24 09:30  Vitals shown include unfiled device data.  Last Pain:  Vitals:   03/28/24 0617  TempSrc:   PainSc: 6       Patients Stated Pain Goal: 3 (03/28/24 0617)  Complications: No notable events documented.

## 2024-03-28 NOTE — Anesthesia Preprocedure Evaluation (Signed)
 Anesthesia Evaluation  Patient identified by MRN, date of birth, ID band Patient awake    Reviewed: Allergy & Precautions, H&P , NPO status , Patient's Chart, lab work & pertinent test results  Airway Mallampati: II   Neck ROM: full    Dental   Pulmonary sleep apnea , former smoker   breath sounds clear to auscultation       Cardiovascular hypertension,  Rhythm:regular Rate:Normal     Neuro/Psych  PSYCHIATRIC DISORDERS Anxiety      Neuromuscular disease    GI/Hepatic   Endo/Other    Renal/GU      Musculoskeletal  (+) Arthritis ,  Fibromyalgia -  Abdominal   Peds  Hematology   Anesthesia Other Findings   Reproductive/Obstetrics                              Anesthesia Physical Anesthesia Plan  ASA: 2  Anesthesia Plan: Spinal and MAC   Post-op Pain Management:    Induction: Intravenous  PONV Risk Score and Plan: 1 and Propofol  infusion, Treatment may vary due to age or medical condition and Midazolam   Airway Management Planned: Simple Face Mask  Additional Equipment:   Intra-op Plan:   Post-operative Plan:   Informed Consent: I have reviewed the patients History and Physical, chart, labs and discussed the procedure including the risks, benefits and alternatives for the proposed anesthesia with the patient or authorized representative who has indicated his/her understanding and acceptance.     Dental advisory given  Plan Discussed with: CRNA, Anesthesiologist and Surgeon  Anesthesia Plan Comments:         Anesthesia Quick Evaluation

## 2024-03-28 NOTE — Progress Notes (Signed)
 Spoke with Nena regarding DME equipment again, patient would like equipment delivered to his home. Address on file confirmed with patient.

## 2024-03-28 NOTE — H&P (Signed)
 PREOPERATIVE H&P  Chief Complaint: right hip osteoarthritis  HPI: Miguel Evans is a 43 y.o. male who presents for surgical treatment of right hip osteoarthritis.  He denies any changes in medical history.  Past Surgical History:  Procedure Laterality Date   ANTERIOR CRUCIATE LIGAMENT REPAIR Bilateral Left in 2002, Right 2013   ANTERIOR CRUCIATE LIGAMENT REPAIR Bilateral 2014   ROTATOR CUFF REPAIR Left 06/05/2020   SHOULDER ARTHROSCOPY WITH SUBACROMIAL DECOMPRESSION AND OPEN ROTATOR C Right 09/23/2018   Procedure: SHOULDER ARTHROSCOPY WITH SUBACROMIAL DECOMPRESSION AND OPEN ROTATOR CUFF REPAIR,;  Surgeon: Anderson Maude ORN, MD;  Location: [facility];  Service: Orthopedics;  Laterality: Right;  WITH BLOCK   VASECTOMY     Social History   Socioeconomic History   Marital status: Married    Spouse name: Not on file   Number of children: Not on file   Years of education: Not on file   Highest education level: Not on file  Occupational History   Not on file  Tobacco Use   Smoking status: Former    Current packs/day: 0.00    Average packs/day: 0.3 packs/day for 5.0 years (1.3 ttl pk-yrs)    Types: Cigarettes    Start date: 03/28/2007    Quit date: 03/27/2012    Years since quitting: 12.0    Passive exposure: Past   Smokeless tobacco: Former  Building Services Engineer status: Never Used  Substance and Sexual Activity   Alcohol use: Yes    Comment: casual   Drug use: No   Sexual activity: Yes    Birth control/protection: Condom  Other Topics Concern   Not on file  Social History Narrative   Right handed   4 children   One story home         Are you right handed or left handed? Right Handed   Are you currently employed ? No   What is your current occupation?   Do you live at home alone? No    Who lives with you? Spouse and two children    What type of home do you live in: 1 story or 2 story? Lives in a one story home       Social Drivers of Health    Tobacco Use: Medium Risk (03/25/2024)   Patient History    Smoking Tobacco Use: Former    Smokeless Tobacco Use: Former    Passive Exposure: Past  Programmer, Applications: Not on Ship Broker Insecurity: Not on file  Transportation Needs: Not on file  Physical Activity: Not on file  Stress: Not on file  Social Connections: Unknown (08/22/2021)   Received from [employer]   Social Network    Social Network: Not on file  Depression (PHQ2-9): Not on file  Alcohol Screen: Not on file  Housing: Not on file  Utilities: Not on file  Health Literacy: Not on file   Family History  Problem Relation Age of Onset   Cancer Mother        breast    Bipolar disorder Mother    Hypertension Father    Osteoarthritis Father    Emphysema Maternal Grandfather    CAD Paternal Grandfather    Healthy Son    Healthy Son    Healthy Daughter    Healthy Daughter    No Known Allergies Prior to Admission medications  Medication Sig Start Date End Date Taking? Authorizing Provider  allopurinol  (ZYLOPRIM ) 300 MG tablet TAKE 1 TABLET DAILY 01/15/24  Yes Cheryl Waddell HERO, PA-C  ALPRAZolam (XANAX) 0.5 MG tablet Take 0.5 mg by mouth 2 (two) times daily as needed for anxiety. 09/03/19  Yes [provider]  amLODipine  (NORVASC ) 5 MG tablet Take 5 mg by mouth in the morning.   Yes [provider]  DULoxetine  (CYMBALTA ) 60 MG capsule Take 60 mg by mouth in the morning. 08/28/22  Yes [provider]  gabapentin  (NEURONTIN ) 300 MG capsule TAKE 1 CAPSULE BY MOUTH TWICE A DAY 01/05/24  Yes Cheryl Waddell HERO, PA-C  Magnesium  Oxide -Mg Supplement (RA MAGNESIUM ) 500 MG CAPS Take 1,000 mg by mouth in the morning and at bedtime.   Yes [provider]  metoprolol tartrate (LOPRESSOR) 25 MG tablet Take 25 mg by mouth 2 (two) times daily. 03/24/22  Yes [provider]  Multiple Vitamins-Minerals (ADULT ONE DAILY GUMMIES PO) Take 2 each by mouth in the morning.   Yes  [provider]  Omega-3 Fatty Acids (FISH OIL) 1200 MG CAPS Take 2,400 mg by mouth in the morning.   Yes [provider]  omeprazole (PRILOSEC) 20 MG capsule Take 20 mg by mouth daily.   Yes [provider]  ixekizumab  (TALTZ ) 80 MG/ML pen Inject 1 mL (80 mg total) into the skin every 28 (twenty-eight) days. 01/12/24   Cheryl Waddell HERO, PA-C     Positive ROS: All other systems have been reviewed and were otherwise negative with the exception of those mentioned in the HPI and as above.  Physical Exam: General: Alert, no acute distress Cardiovascular: No pedal edema Respiratory: No cyanosis, no use of accessory musculature GI: abdomen soft Skin: No lesions in the area of chief complaint Neurologic: Sensation intact distally Psychiatric: Patient is competent for consent with normal mood and affect Lymphatic: no lymphedema  MUSCULOSKELETAL: exam stable  Assessment: right hip osteoarthritis  Plan: Plan for Procedures: ARTHROPLASTY, HIP, TOTAL, ANTERIOR APPROACH  The risks benefits and alternatives were discussed with the patient including but not limited to the risks of nonoperative treatment, versus surgical intervention including infection, bleeding, nerve injury,  blood clots, cardiopulmonary complications, morbidity, mortality, among others, and they were willing to proceed.   [name], MD 03/28/2024 5:50 AM

## 2024-03-28 NOTE — Op Note (Signed)
 ARTHROPLASTY, HIP, TOTAL, ANTERIOR APPROACH  Procedure Note Miguel Evans   [id number]  Pre-op Diagnosis: right hip osteoarthritis     Post-op Diagnosis: same  Operative Findings Severe DJD   Operative Procedures  1. Total hip replacement; Right hip; uncemented [id number]   Surgeon: Kay Evans, M.D.  Assist: Eva Staple, RNFA   Anesthesia: spinal  Prosthesis: Depuy Acetabulum: Pinnacle 54 mm Femur: Actis 8 HO Head: 36 mm size: +1.5 Liner: +4 neutral Bearing Type: ceramic/poly  Total Hip Arthroplasty (Anterior Approach) Op Note:  After informed consent was obtained and the operative extremity marked in the holding area, the patient was brought back to the operating room and placed supine on the HANA table. Next, the operative extremity was prepped and draped in normal sterile fashion. Surgical timeout occurred verifying patient identification, surgical site, surgical procedure and administration of antibiotics.  A 10 cm longitudinal incision was made starting from 2 fingerbreadths lateral and inferior to the ASIS towards the lateral aspect of the patella.  A Hueter approach to the hip was performed, using the interval between tensor fascia lata and sartorius.  Dissection was carried bluntly down onto the anterior hip capsule. The lateral femoral circumflex vessels were identified and coagulated. A capsulectomy was performed.  The neck osteotomy was performed 1 fingerbreadth above the lesser trochanter. The femoral head was removed which showed severe disease, the acetabular rim was cleared of soft tissue and osteophytes and attention was turned to reaming the acetabulum.  Sequential reaming was performed under fluoroscopic guidance down to the floor of the cotyloid fossa. We reamed to a size 53 mm, and then impacted the acetabular shell. A 25 mm cancellous screw was placed to secure the shell.  A +4 neutral liner was then placed after irrigation and attention turned to the femur.  After  placing the femoral hook, the leg was taken to externally rotated, extended and adducted position taking care to perform soft tissue releases to allow for adequate mobilization of the femur. Soft tissue was cleared from the shoulder of the greater trochanter and the hook elevator used to improve exposure of the proximal femur.  Lateral bone from the shoulder was rasped away for relief.  Sequential broaching performed up to a size 8.  High offset trial neck and +1.5 head were placed. The leg was brought back up to neutral and the construct reduced.  The position and sizing of components, offset and leg lengths were checked using fluoroscopy.  Based on fluoroscopic findings, he had too much offset and overlengthening of the leg, therefore, I removed the trial components and resected an additional 3 mm of neck.  I then started rebroaching with the size 6 up to 8.  We retrialed with the high offset neck and +1.5 head.  This helped restore offset and leg length.  Stability of the construct was checked in 45 degrees of hip extension and 90 degrees of external rotation without any subluxation, shuck or impingement of prosthesis. We dislocated the prosthesis, dropped the leg back into position, removed trial components, and irrigated copiously. The final stem and head were chosen then placed, the leg brought back up, the system reduced and fluoroscopy used to verify positioning.  Antibiotic irrigation was placed in the surgical wound.   We irrigated, obtained hemostasis.  A topical mixture of 0.25% bupivacaine  and meloxicam  was placed deep to the fascia.  One gram of vancomycin  powder was placed in the surgical bed.   One gram of topical tranexamic acid  was injected  into the joint.  The fascia was closed with #1 stratafix, the deep fat layer was closed with 0 vicryl, the subcutaneous layers closed with 2.0 Vicryl Plus and the skin closed with 2.0 nylon and dermabond. A sterile dressing was applied. The patient was awakened  in the operating room and taken to recovery in stable condition.  All sponge, needle, and instrument counts were correct at the end of the case.   Position: supine  Complications: see description of procedure.  Time Out: performed   Drains/Packing: none  Estimated blood loss: see anesthesia record  Returned to Recovery Room: in good condition.   Antibiotics: yes   Mechanical VTE (DVT) Prophylaxis: sequential compression devices, TED thigh-high  Chemical VTE (DVT) Prophylaxis: aspirin    Fluid Replacement: see anesthesia record  Specimens Removed: 1 to pathology   Sponge and Instrument Count Correct? yes   PACU: portable radiograph - low AP   Plan/RTC: Return in 2 weeks for suture removal. Weight Bearing/Load Lower Extremity: full  Hip precautions: none Suture Removal: 2 weeks   N. [name], MD [facility] 8:51 AM   Implant Name Type Inv. Item Serial No. Manufacturer Lot No. LRB No. Used Action  LINER NEUTRAL 54X36MM PLUS 4 - [id number] Hips LINER NEUTRAL 54X36MM PLUS 4  [employer] [id number] Right 1 Implanted  CUP ACETAB W/GRIPTION 54 - [id number] Plate CUP ACETAB W/GRIPTION 54  [employer]TH[employer] [id number] Right 1 Implanted  SCREW 6.5MMX25MM - [id number] Screw SCREW 6.5MMX25MM  DEPUY ORTH[employer] [id number] Right 1 Implanted  HEAD CERAMIC DELTA 36 PLUS 1.5 - [id number] Hips HEAD CERAMIC DELTA 36 PLUS 1.5  DE[employer]TH[employer] [id number] Right 1 Implanted  STEM FEM ACTIS HIGH SZ8 - [id number] Stem STEM FEM ACTIS HIGH SZ8  [employer]TH[employer] [id number] Right 1 Implanted

## 2024-03-28 NOTE — TOC Transition Note (Signed)
 Transition of Care [facility]) - Discharge Note  Patient Details  Name: Miguel Evans MRN: [id number] Date of Birth: [birth year]  [facility]) CM/SW Contact:  Nena LITTIE Coffee, RN Phone Number: 03/28/2024, 4:48 PM  Clinical Narrative:    [facility] accepted referral for rolling walker and 3n1. Per PACU staff, pt has been patiently waiting but is now ready to leave. PACU to provide crutches for pt to get home and [employer] will deliver equipment to the home instead of the bedside. Jermaine at [employer] notified of the change.   UPDATE: [employer] confirmed delivery of equipment.    Discharge Plan and Services Additional resources added to the After Visit Summary for               DME Arranged: 3-N-1, Walker rolling DME Agency: [employer] Date DME Agency Contacted: 03/28/24   Representative spoke with at DME Agency: Jermaine   Social Drivers of Health (SDOH) Interventions SDOH Screenings   Social Connections: Unknown (08/22/2021)   Received from [facility]  Tobacco Use: Medium Risk (03/28/2024)   Readmission Risk Interventions     No data to display

## 2024-03-28 NOTE — Evaluation (Signed)
 Physical Therapy Brief Evaluation and Discharge Note Patient Details Name: Miguel Evans MRN: [id number] DOB: [birth year] Today's Date: 03/28/2024   History of Present Illness  43 yo M adm 03/28/24 for Rt anterior THA. PMhx: bil ACL repair, Lt RCR  Clinical Impression  Pt very pleasant and eager to mobilize. Pt able to walk in hall, perform seated and standing exercises with HEP provided. Pt verbally educated for stair sequence with wife present for all education. Pt mod I for all transfers and gait with RW and all education provided. Pt appropriate for return home with post op plan per MD.   Access Code: [email or web address] URL: [email or web address] Date: 03/28/2024 Prepared by: Miguel Evans  Exercises - Seated Long Arc Quad  - 3 x daily - 7 x weekly - 1 sets - 10 reps - Standing Marching  - 3 x daily - 7 x weekly - 1 sets - 10 reps - Standing Hip Abduction  - 3 x daily - 7 x weekly - 1 sets - 10 reps - Standing Hip Extension  - 3 x daily - 7 x weekly - 1 sets - 10 reps - Standing Knee Flexion Strengthening at Chair  - 3 x daily - 7 x weekly - 1 sets - 10 reps       PT Assessment All further PT needs can be met in the next venue of care  Assistance Needed at Discharge  PRN    Equipment Recommendations Rolling walker (2 wheels)  Recommendations for Other Services       Precautions/Restrictions Precautions Precautions: None Restrictions Weight Bearing Restrictions Per Provider Order: Yes RLE Weight Bearing Per Provider Order: Weight bearing as tolerated        Mobility  Bed Mobility   Supine/Sidelying to sit: Modified independent (Device/Increased time)      Transfers Overall transfer level: Modified independent                 General transfer comment: cues for hand placement and sequence    Ambulation/Gait Ambulation/Gait assistance: Modified independent (Device/Increase time) Gait Distance (Feet): 200 Feet Assistive device: Rolling walker (2 wheels) Gait  Pattern/deviations: Step-through pattern, Decreased stride length Gait Speed: Pace WFL    Home Activity Instructions Home Activity Instructions: educated for up with the good and down with bad as well as demonstration for side stepping with rail if needed  Stairs            Modified Rankin (Stroke Patients Only)        Balance Overall balance assessment: Mild deficits observed, not formally tested   Sitting balance-Leahy Scale: Normal       Standing balance-Leahy Scale: Good            Pertinent Vitals/Pain PT - Brief Vital Signs All Vital Signs Stable: Yes Pain Assessment Pain Assessment: 0-10 Pain Score: 3  Pain Location: Rt hip Pain Descriptors / Indicators: Aching, Pressure Pain Intervention(s): Limited activity within patient's tolerance, Monitored during session, Repositioned     Home Living Family/patient expects to be discharged to:: Private residence Living Arrangements: Spouse/significant other Available Help at Discharge: Family;Available 24 hours/day Home Environment: Stairs to enter;Rail - right  Stairs-Number of Steps: 5 Home Equipment: Cane - quad;Crutches        Prior Function Level of Independence: Independent      UE/LE Assessment   UE ROM/Strength/Tone/Coordination: WFL    LE ROM/Strength/Tone/Coordination: [facility]      Communication   Communication Communication: No apparent difficulties     Cognition Overall  Cognitive Status: Appears within functional limits for tasks assessed/performed       General Comments      Exercises Total Joint Exercises Hip ABduction/ADduction: AROM, Right, Standing, 10 reps Long Arc Quad: AROM, Right, Seated, 10 reps Knee Flexion: AROM, Right, Seated, Strengthening, 10 reps   Assessment/Plan    PT Problem List Decreased activity tolerance;Decreased mobility       PT Visit Diagnosis Other abnormalities of gait and mobility (R26.89)    No Skilled PT     Co-evaluation                 AMPAC 6 Clicks Help needed turning from your back to your side while in a flat bed without using bedrails?: None Help needed moving from lying on your back to sitting on the side of a flat bed without using bedrails?: None Help needed moving to and from a bed to a chair (including a wheelchair)?: None Help needed standing up from a chair using your arms (e.g., wheelchair or bedside chair)?: None Help needed to walk in hospital room?: A Little Help needed climbing 3-5 steps with a railing? : A Little 6 Click Score: 22      End of Session Equipment Utilized During Treatment: Gait belt Activity Tolerance: Patient tolerated treatment well Patient left: in chair;with call bell/phone within reach;with family/visitor present Nurse Communication: Mobility status PT Visit Diagnosis: Other abnormalities of gait and mobility (R26.89)     Time: 8773-8753 PT Time Calculation (min) (ACUTE ONLY): 20 min  Charges:   PT Evaluation $PT Eval Low Complexity: 1 Low      Miguel Evans P, PT Acute Rehabilitation Services Office: [phone]   Miguel Evans NOVAK Miguel Evans  03/28/2024, 12:57 PM

## 2024-03-28 NOTE — Discharge Summary (Signed)
 Patient ID: Miguel Evans MRN: [id number] DOB/AGE: April 13, 1981 43 y.o.  Admit date: 03/28/2024 Discharge date: 03/28/2024  Admission Diagnoses:  Primary osteoarthritis of right hip  Discharge Diagnoses:  Principal Problem:   Primary osteoarthritis of right hip Active Problems:   Status post total replacement of right hip   Past Medical History:  Diagnosis Date   Anxiety    Arthritis    Fibromyalgia    Gout    Hypertension    Psoriasis    Psoriatic arthritis (HCC)    Sleep apnea    Small fiber neuropathy 08/2023    Surgeries: Procedures: ARTHROPLASTY, HIP, TOTAL, ANTERIOR APPROACH on 03/28/2024   Consultants (if any):   Discharged Condition: Improved  Hospital Course: Gerik Coberly is an 43 y.o. male who was admitted 03/28/2024 with a diagnosis of Primary osteoarthritis of right hip and went to the operating room on 03/28/2024 and underwent the above named procedures.    He was given perioperative antibiotics:  Anti-infectives (From admission, onward)    Start     Dose/Rate Route Frequency Ordered Stop   03/28/24 1030  ceFAZolin  (ANCEF ) IVPB 2g/100 mL premix        2 g 200 mL/hr over 30 Minutes Intravenous Every 6 hours 03/28/24 1022 03/28/24 2229   03/28/24 0815  vancomycin  (VANCOCIN ) powder  Status:  Discontinued          As needed 03/28/24 0816 03/28/24 0927   03/28/24 0615  ceFAZolin  (ANCEF ) IVPB 2g/100 mL premix        2 g 200 mL/hr over 30 Minutes Intravenous On call to O.R. 03/28/24 0608 03/28/24 0734   03/28/24 0000  doxycycline  (VIBRA -TABS) 100 MG tablet        100 mg Oral 2 times daily 03/28/24 0625       .  He was given sequential compression devices, early ambulation, and appropriate chemoprophylaxis for DVT prophylaxis.  He benefited maximally from the hospital stay and there were no complications.    Recent vital signs:  Vitals:   03/28/24 1145 03/28/24 1200  BP: 136/77 (!) 141/89  Pulse: 60 75  Resp: 12 16  Temp:  98.5 F (36.9 C)  SpO2:  100% 97%    Recent laboratory studies:  Lab Results  Component Value Date   HGB 15.3 03/25/2024   HGB 16.7 03/17/2024   HGB 16.4 12/17/2023   Lab Results  Component Value Date   WBC 7.2 03/25/2024   PLT 200 03/25/2024   Lab Results  Component Value Date   INR 0.9 09/22/2018   Lab Results  Component Value Date   NA 140 03/25/2024   K 4.4 03/25/2024   CL 103 03/25/2024   CO2 29 03/25/2024   BUN 22 (H) 03/25/2024   CREATININE 1.08 03/25/2024   GLUCOSE 82 03/25/2024    Discharge Medications:   Allergies as of 03/28/2024   No Known Allergies      Medication List     STOP taking these medications    Taltz  80 MG/ML pen Generic drug: ixekizumab        TAKE these medications    ADULT ONE DAILY GUMMIES PO Take 2 each by mouth in the morning.   allopurinol  300 MG tablet Commonly known as: ZYLOPRIM  TAKE 1 TABLET DAILY   ALPRAZolam 0.5 MG tablet Commonly known as: XANAX Take 0.5 mg by mouth 2 (two) times daily as needed for anxiety.   amLODipine  5 MG tablet Commonly known as: NORVASC  Take 5 mg by mouth  in the morning.   aspirin  EC 81 MG tablet Take 1 tablet (81 mg total) by mouth 2 (two) times daily.   doxycycline  100 MG tablet Commonly known as: VIBRA -TABS Take 1 tablet (100 mg total) by mouth 2 (two) times daily.   DULoxetine  60 MG capsule Commonly known as: CYMBALTA  Take 60 mg by mouth in the morning.   Fish Oil 1200 MG Caps Take 2,400 mg by mouth in the morning.   gabapentin  300 MG capsule Commonly known as: NEURONTIN  TAKE 1 CAPSULE BY MOUTH TWICE A DAY   methocarbamol  500 MG tablet Commonly known as: ROBAXIN  Take 1 tablet (500 mg total) by mouth every 6 (six) hours as needed for muscle spasms.   metoprolol tartrate 25 MG tablet Commonly known as: LOPRESSOR Take 25 mg by mouth 2 (two) times daily.   omeprazole 20 MG capsule Commonly known as: PRILOSEC Take 20 mg by mouth daily.   ondansetron  4 MG tablet Commonly known as:  ZOFRAN  Take 1-2 tablets (4-8 mg total) by mouth every 8 (eight) hours as needed for nausea or vomiting.   oxyCODONE -acetaminophen  5-325 MG tablet Commonly known as: Percocet Take 1-2 tablets by mouth 2 (two) times daily as needed for severe pain (pain score 7-10).   RA Magnesium  500 MG Caps Generic drug: Magnesium  Oxide -Mg Supplement Take 1,000 mg by mouth in the morning and at bedtime.               Durable Medical Equipment  (From admission, onward)           Start     Ordered   03/28/24 1023  DME Walker rolling  Once       Question:  Patient needs a walker to treat with the following condition  Answer:  History of hip replacement   03/28/24 1022   03/28/24 1023  DME 3 n 1  Once        03/28/24 1022   03/28/24 1023  DME Bedside commode  Once       Question:  Patient needs a bedside commode to treat with the following condition  Answer:  History of hip replacement   03/28/24 1022            Diagnostic Studies: DG Pelvis Portable Result Date: 03/28/2024 CLINICAL DATA:  Postop. EXAM: PORTABLE PELVIS 1-2 VIEWS COMPARISON:  Preoperative imaging FINDINGS: Right hip arthroplasty in expected alignment. No periprosthetic lucency or fracture. Recent postsurgical change includes air and edema in the soft tissues. IMPRESSION: Right hip arthroplasty without immediate postoperative complication. Electronically Signed   By: Andrea Gasman M.D.   On: 03/28/2024 10:31   DG HIP UNILAT WITH PELVIS 1V RIGHT Result Date: 03/28/2024 CLINICAL DATA:  Elective surgery. EXAM: DG HIP (WITH OR WITHOUT PELVIS) 1V RIGHT COMPARISON:  None Available. FINDINGS: Four fluoroscopic spot views of the pelvis and right hip obtained in the operating room. Images during hip arthroplasty. Fluoroscopy time 28 seconds. Dose 4.46 mGy. IMPRESSION: Intraoperative fluoroscopy during right hip arthroplasty. Electronically Signed   By: Andrea Gasman M.D.   On: 03/28/2024 10:27   DG C-Arm 1-60 Min-No  Report Result Date: 03/28/2024 Fluoroscopy was utilized by the requesting physician.  No radiographic interpretation.   DG C-Arm 1-60 Min-No Report Result Date: 03/28/2024 Fluoroscopy was utilized by the requesting physician.  No radiographic interpretation.    Disposition: Discharge disposition: 01-Home or Self Care       Discharge Instructions     Call MD / Call 911  Complete by: As directed    If you experience chest pain or shortness of breath, CALL 911 and be transported to the hospital emergency room.  If you develope a fever above 101.5 F, pus (white drainage) or increased drainage or redness at the wound, or calf pain, call your surgeon's office.   Constipation Prevention   Complete by: As directed    Drink plenty of fluids.  Prune juice may be helpful.  You may use a stool softener, such as Colace (over the counter) 100 mg twice a day.  Use MiraLax  (over the counter) for constipation as needed.   Driving restrictions   Complete by: As directed    No driving while taking narcotic pain meds.   Increase activity slowly as tolerated   Complete by: As directed    Post-operative opioid taper instructions:   Complete by: As directed    POST-OPERATIVE OPIOID TAPER INSTRUCTIONS: It is important to wean off of your opioid medication as soon as possible. If you do not need pain medication after your surgery it is ok to stop day one. Opioids include: Codeine, Hydrocodone (Norco, Vicodin), Oxycodone (Percocet, oxycontin ) and hydromorphone  amongst others.  Long term and even short term use of opiods can cause: Increased pain response Dependence Constipation Depression Respiratory depression And more.  Withdrawal symptoms can include Flu like symptoms Nausea, vomiting And more Techniques to manage these symptoms Hydrate well Eat regular healthy meals Stay active Use relaxation techniques(deep breathing, meditating, yoga) Do Not substitute Alcohol to help with tapering If  you have been on opioids for less than two weeks and do not have pain than it is ok to stop all together.  Plan to wean off of opioids This plan should start within one week post op of your joint replacement. Maintain the same interval or time between taking each dose and first decrease the dose.  Cut the total daily intake of opioids by one tablet each day Next start to increase the time between doses. The last dose that should be eliminated is the evening dose.             Signed: [name] 03/28/2024, 2:41 PM

## 2024-03-28 NOTE — Progress Notes (Signed)
 Spoke with Nena, CM regarding DME equipment for home. She will call back with estimated time of delivery.

## 2024-03-28 NOTE — Anesthesia Procedure Notes (Signed)
 Procedure Name: LMA Insertion Date/Time: 03/28/2024 8:02 AM  Performed by: Mannie Krystal LABOR, CRNAPre-anesthesia Checklist: Patient identified, Emergency Drugs available, Suction available and Patient being monitored Patient Re-evaluated:Patient Re-evaluated prior to induction Oxygen Delivery Method: Circle system utilized Preoxygenation: Pre-oxygenation with 100% oxygen Induction Type: IV induction Ventilation: Mask ventilation without difficulty LMA: LMA inserted LMA Size: 5.0 Number of attempts: 1 Placement Confirmation: positive ETCO2 and breath sounds checked- equal and bilateral Tube secured with: Tape Dental Injury: Teeth and Oropharynx as per pre-operative assessment

## 2024-03-28 NOTE — Anesthesia Procedure Notes (Signed)
 Spinal  Patient location during procedure: OR Start time: 03/28/2024 7:25 AM End time: 03/28/2024 7:30 AM Reason for block: surgical anesthesia  Staffing Performed: anesthesiologist  Authorized by: Epifanio Fallow, MD   Performed by: Epifanio Fallow, MD  Preanesthetic Checklist Completed: patient identified, IV checked, risks and benefits discussed, surgical consent, monitors and equipment checked, pre-op evaluation and timeout performed Spinal Block Patient position: sitting Prep: DuraPrep Patient monitoring: cardiac monitor, continuous pulse ox and blood pressure Approach: midline Location: L3-4 Injection technique: single-shot Needle Needle type: Pencan  Needle gauge: 24 G Needle length: 9 cm Assessment Sensory level: T8 Events: CSF return  Additional Notes Functioning IV was confirmed and monitors were applied. Sterile prep and drape, including hand hygiene and sterile gloves were used. The patient was positioned and the spine was prepped. The skin was anesthetized with lidocaine .  Free flow of clear CSF was obtained prior to injecting local anesthetic into the CSF.  The spinal needle aspirated freely following injection.  The needle was carefully withdrawn.  The patient tolerated the procedure well.

## 2024-03-28 NOTE — Discharge Instructions (Signed)

## 2024-03-29 ENCOUNTER — Encounter (HOSPITAL_COMMUNITY): Payer: Self-pay | Admitting: Orthopaedic Surgery

## 2024-03-29 NOTE — Anesthesia Postprocedure Evaluation (Addendum)
 Anesthesia Post Note  Patient: Miguel Evans  Procedure(s) Performed: ARTHROPLASTY, HIP, TOTAL, ANTERIOR APPROACH (Right: Hip)     Patient location during evaluation: PACU Anesthesia Type: MAC, Spinal and General Level of consciousness: oriented and awake and alert Pain management: pain level controlled Vital Signs Assessment: post-procedure vital signs reviewed and stable Respiratory status: spontaneous breathing, respiratory function stable and patient connected to nasal cannula oxygen Cardiovascular status: blood pressure returned to baseline and stable Postop Assessment: no headache, no backache and no apparent nausea or vomiting Anesthetic complications: no Comments: Incomplete analgesia with spinal.  Converted to GA.   No notable events documented.  Last Vitals:  Vitals:   03/28/24 1145 03/28/24 1200  BP: 136/77 (!) 141/89  Pulse: 60 75  Resp: 12 16  Temp:  36.9 C  SpO2: 100% 97%    Last Pain:  Vitals:   03/28/24 1119  TempSrc:   PainSc: 5                  Criag Wicklund S

## 2024-03-30 DIAGNOSIS — Z471 Aftercare following joint replacement surgery: Secondary | ICD-10-CM | POA: Diagnosis not present

## 2024-03-30 DIAGNOSIS — M130 Polyarthritis, unspecified: Secondary | ICD-10-CM | POA: Diagnosis not present

## 2024-03-30 DIAGNOSIS — Z7982 Long term (current) use of aspirin: Secondary | ICD-10-CM | POA: Diagnosis not present

## 2024-03-30 DIAGNOSIS — Z96641 Presence of right artificial hip joint: Secondary | ICD-10-CM | POA: Diagnosis not present

## 2024-03-30 DIAGNOSIS — F419 Anxiety disorder, unspecified: Secondary | ICD-10-CM | POA: Diagnosis not present

## 2024-03-30 DIAGNOSIS — Z87891 Personal history of nicotine dependence: Secondary | ICD-10-CM | POA: Diagnosis not present

## 2024-03-30 DIAGNOSIS — M7918 Myalgia, other site: Secondary | ICD-10-CM | POA: Diagnosis not present

## 2024-03-30 DIAGNOSIS — M5010 Cervical disc disorder with radiculopathy, unspecified cervical region: Secondary | ICD-10-CM | POA: Diagnosis not present

## 2024-03-30 DIAGNOSIS — L4050 Arthropathic psoriasis, unspecified: Secondary | ICD-10-CM | POA: Diagnosis not present

## 2024-03-30 DIAGNOSIS — I10 Essential (primary) hypertension: Secondary | ICD-10-CM | POA: Diagnosis not present

## 2024-04-06 DIAGNOSIS — Z96641 Presence of right artificial hip joint: Secondary | ICD-10-CM | POA: Diagnosis not present

## 2024-04-06 DIAGNOSIS — L4050 Arthropathic psoriasis, unspecified: Secondary | ICD-10-CM | POA: Diagnosis not present

## 2024-04-06 DIAGNOSIS — Z7982 Long term (current) use of aspirin: Secondary | ICD-10-CM | POA: Diagnosis not present

## 2024-04-06 DIAGNOSIS — M130 Polyarthritis, unspecified: Secondary | ICD-10-CM | POA: Diagnosis not present

## 2024-04-06 DIAGNOSIS — Z471 Aftercare following joint replacement surgery: Secondary | ICD-10-CM | POA: Diagnosis not present

## 2024-04-06 DIAGNOSIS — M7918 Myalgia, other site: Secondary | ICD-10-CM | POA: Diagnosis not present

## 2024-04-06 DIAGNOSIS — M5010 Cervical disc disorder with radiculopathy, unspecified cervical region: Secondary | ICD-10-CM | POA: Diagnosis not present

## 2024-04-06 DIAGNOSIS — F419 Anxiety disorder, unspecified: Secondary | ICD-10-CM | POA: Diagnosis not present

## 2024-04-06 DIAGNOSIS — I10 Essential (primary) hypertension: Secondary | ICD-10-CM | POA: Diagnosis not present

## 2024-04-06 DIAGNOSIS — Z87891 Personal history of nicotine dependence: Secondary | ICD-10-CM | POA: Diagnosis not present

## 2024-04-08 DIAGNOSIS — L4050 Arthropathic psoriasis, unspecified: Secondary | ICD-10-CM | POA: Diagnosis not present

## 2024-04-08 DIAGNOSIS — F419 Anxiety disorder, unspecified: Secondary | ICD-10-CM | POA: Diagnosis not present

## 2024-04-08 DIAGNOSIS — M130 Polyarthritis, unspecified: Secondary | ICD-10-CM | POA: Diagnosis not present

## 2024-04-08 DIAGNOSIS — M5010 Cervical disc disorder with radiculopathy, unspecified cervical region: Secondary | ICD-10-CM | POA: Diagnosis not present

## 2024-04-08 DIAGNOSIS — Z7982 Long term (current) use of aspirin: Secondary | ICD-10-CM | POA: Diagnosis not present

## 2024-04-08 DIAGNOSIS — I10 Essential (primary) hypertension: Secondary | ICD-10-CM | POA: Diagnosis not present

## 2024-04-08 DIAGNOSIS — Z96641 Presence of right artificial hip joint: Secondary | ICD-10-CM | POA: Diagnosis not present

## 2024-04-08 DIAGNOSIS — M7918 Myalgia, other site: Secondary | ICD-10-CM | POA: Diagnosis not present

## 2024-04-08 DIAGNOSIS — Z87891 Personal history of nicotine dependence: Secondary | ICD-10-CM | POA: Diagnosis not present

## 2024-04-08 DIAGNOSIS — Z471 Aftercare following joint replacement surgery: Secondary | ICD-10-CM | POA: Diagnosis not present

## 2024-04-12 ENCOUNTER — Ambulatory Visit (INDEPENDENT_AMBULATORY_CARE_PROVIDER_SITE_OTHER): Admitting: Physician Assistant

## 2024-04-12 DIAGNOSIS — Z96641 Presence of right artificial hip joint: Secondary | ICD-10-CM

## 2024-04-12 MED ORDER — METHOCARBAMOL 500 MG PO TABS: 500.0000 mg | ORAL_TABLET | Freq: Three times a day (TID) | ORAL | 2 refills | Status: AC | PRN

## 2024-04-12 NOTE — Progress Notes (Signed)
 "  Post-Op Visit Note   Patient: Miguel Evans           Date of Birth: 08/05/1980           MRN: [id number] Visit Date: 04/12/2024 PCP: Sabas Norleen PARAS., MD   Assessment & Plan:  Chief Complaint:  Chief Complaint  Patient presents with   Right Hip - Pain    R THA-03/28/2024   Visit Diagnoses:  1. Status post total replacement of right hip     Plan: Patient is a pleasant 43 year old gentleman who comes in today 2 weeks status post right total hip replacement.  He has been doing better.  He notes more of a pressure feeling in his right thigh then pain.  He has been taking Robaxin  and Tylenol  which does seem to help.  He did have increased pain from his first PT visit but has been doing better since.  He is ambulating with a single-point cane.  He has been compliant taking baby aspirin  twice daily for DVT prophylaxis.  Examination of his right hip reveals well-healed surgical incision with nylon sutures in place.  No evidence of infection or cellulitis.  Calves are soft nontender.  He is neurovascularly intact distally.  Today, sutures were removed and Steri-Strips applied.  He will continue taking his aspirin  twice daily for another 4 weeks.  I have refilled his Robaxin .  Follow-up in 4 weeks for repeat evaluation and AP pelvis x-rays.  Call with concerns or questions.  Follow-Up Instructions: Return in about 4 weeks (around 05/10/2024).   Orders:  No orders of the defined types were placed in this encounter.  Meds ordered this encounter  Medications   methocarbamol  (ROBAXIN ) 500 MG tablet    Sig: Take 1 tablet (500 mg total) by mouth every 8 (eight) hours as needed for muscle spasms.    Dispense:  20 tablet    Refill:  2    Imaging: No new imaging  PMFS History: Patient Active Problem List   Diagnosis Date Noted   Status post total replacement of right hip 03/28/2024   Primary osteoarthritis of right hip 02/03/2024   Cervical disc disorder with radiculopathy 02/22/2019    Myofascial pain syndrome 02/22/2019   Tear of right rotator cuff 09/23/2018   Psoriasis 03/27/2017   Polyarthritis 02/24/2017   Patellofemoral arthralgia of both knees 02/24/2017   Hyperuricemia 03/26/2016   Elevated blood pressure  03/26/2016   Elevated LFTs 03/26/2016   Anxiety 03/26/2016   Psoriatic arthritis (HCC) 03/25/2016   High risk medication use 03/25/2016   Cervicalgia 03/25/2016   Hand pain 03/25/2016   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   Past Medical History:  Diagnosis Date   Anxiety    Arthritis    Fibromyalgia    Gout    Hypertension    Psoriasis    Psoriatic arthritis (HCC)    Sleep apnea    Small fiber neuropathy 08/2023    Family History  Problem Relation Age of Onset   Cancer Mother        breast    Bipolar disorder Mother    Hypertension Father    Osteoarthritis Father    Emphysema Maternal Grandfather    CAD Paternal Grandfather    Healthy Son    Healthy Son    Healthy Daughter    Healthy Daughter     Past Surgical History:  Procedure Laterality Date   ANTERIOR CRUCIATE LIGAMENT REPAIR Bilateral Left in 2002, Right 2013   ANTERIOR  CRUCIATE LIGAMENT REPAIR Bilateral 2014   ROTATOR CUFF REPAIR Left 06/05/2020   SHOULDER ARTHROSCOPY WITH SUBACROMIAL DECOMPRESSION AND OPEN ROTATOR C Right 09/23/2018   Procedure: SHOULDER ARTHROSCOPY WITH SUBACROMIAL DECOMPRESSION AND OPEN ROTATOR CUFF REPAIR,;  Surgeon: Anderson Maude ORN, MD;  Location: [facility];  Service: Orthopedics;  Laterality: Right;  WITH BLOCK   TOTAL HIP ARTHROPLASTY Right 03/28/2024   Procedure: ARTHROPLASTY, HIP, TOTAL, ANTERIOR APPROACH;  Surgeon: Jerri Kay HERO, MD;  Location: [facility] OR;  Service: Orthopedics;  Laterality: Right;  3-C   VASECTOMY     Social History   Occupational History   Not on file  Tobacco Use   Smoking status: Former    Current packs/day: 0.00    Average packs/day: 0.3 packs/day for 5.0 years (1.3 ttl pk-yrs)    Types: Cigarettes    Start date: 03/28/2007     Quit date: 03/27/2012    Years since quitting: 12.0    Passive exposure: Past   Smokeless tobacco: Former  Building Services Engineer status: Never Used  Substance and Sexual Activity   Alcohol use: Yes    Comment: casual   Drug use: No   Sexual activity: Yes    Birth control/protection: Condom     "

## 2024-04-13 ENCOUNTER — Other Ambulatory Visit: Payer: Self-pay | Admitting: Physician Assistant

## 2024-04-13 DIAGNOSIS — M7918 Myalgia, other site: Secondary | ICD-10-CM | POA: Diagnosis not present

## 2024-04-13 DIAGNOSIS — M5010 Cervical disc disorder with radiculopathy, unspecified cervical region: Secondary | ICD-10-CM | POA: Diagnosis not present

## 2024-04-13 DIAGNOSIS — I10 Essential (primary) hypertension: Secondary | ICD-10-CM | POA: Diagnosis not present

## 2024-04-13 DIAGNOSIS — Z87891 Personal history of nicotine dependence: Secondary | ICD-10-CM | POA: Diagnosis not present

## 2024-04-13 DIAGNOSIS — F419 Anxiety disorder, unspecified: Secondary | ICD-10-CM | POA: Diagnosis not present

## 2024-04-13 DIAGNOSIS — M130 Polyarthritis, unspecified: Secondary | ICD-10-CM | POA: Diagnosis not present

## 2024-04-13 DIAGNOSIS — Z471 Aftercare following joint replacement surgery: Secondary | ICD-10-CM | POA: Diagnosis not present

## 2024-04-13 DIAGNOSIS — Z96641 Presence of right artificial hip joint: Secondary | ICD-10-CM | POA: Diagnosis not present

## 2024-04-13 DIAGNOSIS — L4050 Arthropathic psoriasis, unspecified: Secondary | ICD-10-CM | POA: Diagnosis not present

## 2024-04-13 DIAGNOSIS — Z7982 Long term (current) use of aspirin: Secondary | ICD-10-CM | POA: Diagnosis not present

## 2024-04-13 NOTE — Telephone Encounter (Signed)
 Last Fill: 01/05/2024  Next Visit: 06/21/2024  Last Visit: 03/17/2024  Dx: Psoriatic arthritis   Current Dose per office note on 03/17/2024: gabapentin  300 mg twice daily   Okay to refill Gabapentin ?

## 2024-04-14 ENCOUNTER — Other Ambulatory Visit: Payer: Self-pay | Admitting: Physician Assistant

## 2024-04-14 DIAGNOSIS — M1A09X0 Idiopathic chronic gout, multiple sites, without tophus (tophi): Secondary | ICD-10-CM

## 2024-04-15 NOTE — Telephone Encounter (Signed)
 Last Fill: 01/15/2024  Labs: 03/17/2024 CBC, CMP are stable, uric acid is in the desirable range. TB gold negative    Next Visit: 06/21/2024  Last Visit: 03/17/2024  DX: Idiopathic chronic gout of multiple sites without tophus      Current Dose per office note 03/17/2024: allopurinol  300 mg 1 tablet by mouth daily   Okay to refill Allopurinol ?

## 2024-05-10 ENCOUNTER — Encounter: Admitting: Physician Assistant

## 2024-05-12 ENCOUNTER — Encounter: Admitting: Physician Assistant

## 2024-05-17 ENCOUNTER — Other Ambulatory Visit: Payer: Self-pay

## 2024-05-17 ENCOUNTER — Ambulatory Visit: Admitting: Physician Assistant

## 2024-05-17 DIAGNOSIS — Z96641 Presence of right artificial hip joint: Secondary | ICD-10-CM | POA: Diagnosis not present

## 2024-06-06 ENCOUNTER — Ambulatory Visit: Admitting: Neurology

## 2024-06-21 ENCOUNTER — Encounter: Admitting: Physician Assistant

## 2024-06-21 ENCOUNTER — Ambulatory Visit: Admitting: Physician Assistant
